# Patient Record
Sex: Female | Born: 1945 | Race: White | Hispanic: No | Marital: Married | Smoking: Never smoker
Health system: Southern US, Community
[De-identification: ages and names within clinical notes are randomized; demographics above are authoritative.]

## PROBLEM LIST (undated history)

## (undated) DIAGNOSIS — R002 Palpitations: Secondary | ICD-10-CM

## (undated) DIAGNOSIS — I341 Nonrheumatic mitral (valve) prolapse: Secondary | ICD-10-CM

## (undated) DIAGNOSIS — I071 Rheumatic tricuspid insufficiency: Secondary | ICD-10-CM

## (undated) DIAGNOSIS — R011 Cardiac murmur, unspecified: Secondary | ICD-10-CM

## (undated) DIAGNOSIS — K219 Gastro-esophageal reflux disease without esophagitis: Secondary | ICD-10-CM

## (undated) DIAGNOSIS — H269 Unspecified cataract: Secondary | ICD-10-CM

## (undated) DIAGNOSIS — Z9889 Other specified postprocedural states: Secondary | ICD-10-CM

## (undated) DIAGNOSIS — I4891 Unspecified atrial fibrillation: Secondary | ICD-10-CM

## (undated) DIAGNOSIS — R51 Headache: Secondary | ICD-10-CM

## (undated) DIAGNOSIS — I340 Nonrheumatic mitral (valve) insufficiency: Secondary | ICD-10-CM

## (undated) DIAGNOSIS — R112 Nausea with vomiting, unspecified: Secondary | ICD-10-CM

## (undated) DIAGNOSIS — H9191 Unspecified hearing loss, right ear: Secondary | ICD-10-CM

## (undated) DIAGNOSIS — Z5189 Encounter for other specified aftercare: Secondary | ICD-10-CM

## (undated) DIAGNOSIS — M1990 Unspecified osteoarthritis, unspecified site: Secondary | ICD-10-CM

## (undated) DIAGNOSIS — I10 Essential (primary) hypertension: Secondary | ICD-10-CM

## (undated) DIAGNOSIS — M2669 Other specified disorders of temporomandibular joint: Secondary | ICD-10-CM

## (undated) DIAGNOSIS — Z789 Other specified health status: Secondary | ICD-10-CM

## (undated) DIAGNOSIS — T7840XA Allergy, unspecified, initial encounter: Secondary | ICD-10-CM

## (undated) DIAGNOSIS — E039 Hypothyroidism, unspecified: Secondary | ICD-10-CM

## (undated) DIAGNOSIS — D3500 Benign neoplasm of unspecified adrenal gland: Secondary | ICD-10-CM

## (undated) DIAGNOSIS — H538 Other visual disturbances: Secondary | ICD-10-CM

## (undated) DIAGNOSIS — C73 Malignant neoplasm of thyroid gland: Secondary | ICD-10-CM

## (undated) HISTORY — DX: Palpitations: R00.2

## (undated) HISTORY — PX: UPPER GASTROINTESTINAL ENDOSCOPY: SHX188

## (undated) HISTORY — DX: Encounter for other specified aftercare: Z51.89

## (undated) HISTORY — DX: Rheumatic tricuspid insufficiency: I07.1

## (undated) HISTORY — DX: Malignant neoplasm of thyroid gland: C73

## (undated) HISTORY — DX: Cardiac murmur, unspecified: R01.1

## (undated) HISTORY — DX: Benign neoplasm of unspecified adrenal gland: D35.00

## (undated) HISTORY — PX: THYROIDECTOMY: SHX17

## (undated) HISTORY — DX: Unspecified atrial fibrillation: I48.91

## (undated) HISTORY — PX: OTHER SURGICAL HISTORY: SHX169

## (undated) HISTORY — DX: Nonrheumatic mitral (valve) prolapse: I34.1

## (undated) HISTORY — DX: Gastro-esophageal reflux disease without esophagitis: K21.9

## (undated) HISTORY — DX: Allergy, unspecified, initial encounter: T78.40XA

## (undated) HISTORY — PX: APPENDECTOMY: SHX54

## (undated) HISTORY — DX: Unspecified osteoarthritis, unspecified site: M19.90

## (undated) HISTORY — PX: WRIST SURGERY: SHX841

## (undated) HISTORY — DX: Nonrheumatic mitral (valve) insufficiency: I34.0

---

## 1998-02-09 ENCOUNTER — Other Ambulatory Visit: Admission: RE | Admit: 1998-02-09 | Discharge: 1998-02-09 | Payer: Self-pay | Admitting: Family Medicine

## 1999-02-02 ENCOUNTER — Other Ambulatory Visit: Admission: RE | Admit: 1999-02-02 | Discharge: 1999-02-02 | Payer: Self-pay | Admitting: Family Medicine

## 2001-07-10 ENCOUNTER — Other Ambulatory Visit: Admission: RE | Admit: 2001-07-10 | Discharge: 2001-07-10 | Payer: Self-pay | Admitting: Family Medicine

## 2004-06-09 ENCOUNTER — Other Ambulatory Visit: Admission: RE | Admit: 2004-06-09 | Discharge: 2004-06-09 | Payer: Self-pay | Admitting: Family Medicine

## 2005-12-26 ENCOUNTER — Ambulatory Visit: Payer: Self-pay | Admitting: Cardiology

## 2006-01-07 ENCOUNTER — Ambulatory Visit: Payer: Self-pay | Admitting: Cardiology

## 2006-01-07 ENCOUNTER — Encounter: Payer: Self-pay | Admitting: Cardiology

## 2006-02-01 ENCOUNTER — Other Ambulatory Visit: Admission: RE | Admit: 2006-02-01 | Discharge: 2006-02-01 | Payer: Self-pay | Admitting: Family Medicine

## 2007-02-21 ENCOUNTER — Other Ambulatory Visit: Admission: RE | Admit: 2007-02-21 | Discharge: 2007-02-21 | Payer: Self-pay | Admitting: Family Medicine

## 2007-11-28 ENCOUNTER — Emergency Department (HOSPITAL_COMMUNITY): Admission: EM | Admit: 2007-11-28 | Discharge: 2007-11-28 | Payer: Self-pay | Admitting: Emergency Medicine

## 2008-01-21 ENCOUNTER — Encounter: Admission: RE | Admit: 2008-01-21 | Discharge: 2008-03-25 | Payer: Self-pay | Admitting: Orthopedic Surgery

## 2009-02-02 ENCOUNTER — Ambulatory Visit: Payer: Self-pay | Admitting: Cardiology

## 2009-03-08 ENCOUNTER — Telehealth: Payer: Self-pay | Admitting: Cardiology

## 2010-03-10 ENCOUNTER — Observation Stay (HOSPITAL_COMMUNITY): Admission: EM | Admit: 2010-03-10 | Discharge: 2010-03-11 | Payer: Self-pay | Admitting: Emergency Medicine

## 2010-03-10 ENCOUNTER — Ambulatory Visit: Payer: Self-pay | Admitting: Cardiology

## 2010-03-11 ENCOUNTER — Encounter: Payer: Self-pay | Admitting: Cardiology

## 2010-03-14 ENCOUNTER — Telehealth: Payer: Self-pay | Admitting: Cardiology

## 2010-03-28 ENCOUNTER — Ambulatory Visit: Payer: Self-pay

## 2010-03-28 ENCOUNTER — Ambulatory Visit (HOSPITAL_COMMUNITY): Admission: RE | Admit: 2010-03-28 | Discharge: 2010-03-28 | Payer: Self-pay | Admitting: Cardiology

## 2010-03-28 ENCOUNTER — Encounter: Payer: Self-pay | Admitting: Cardiology

## 2010-03-28 ENCOUNTER — Ambulatory Visit: Payer: Self-pay | Admitting: Cardiology

## 2010-03-28 DIAGNOSIS — I059 Rheumatic mitral valve disease, unspecified: Secondary | ICD-10-CM

## 2010-03-28 DIAGNOSIS — M810 Age-related osteoporosis without current pathological fracture: Secondary | ICD-10-CM

## 2010-03-28 DIAGNOSIS — K219 Gastro-esophageal reflux disease without esophagitis: Secondary | ICD-10-CM

## 2010-03-28 DIAGNOSIS — R002 Palpitations: Secondary | ICD-10-CM | POA: Insufficient documentation

## 2010-04-04 ENCOUNTER — Ambulatory Visit: Payer: Self-pay | Admitting: Internal Medicine

## 2010-04-04 ENCOUNTER — Encounter: Payer: Self-pay | Admitting: Physician Assistant

## 2010-04-04 DIAGNOSIS — I4891 Unspecified atrial fibrillation: Secondary | ICD-10-CM | POA: Insufficient documentation

## 2010-04-04 DIAGNOSIS — T887XXA Unspecified adverse effect of drug or medicament, initial encounter: Secondary | ICD-10-CM

## 2010-04-06 ENCOUNTER — Telehealth: Payer: Self-pay | Admitting: Cardiology

## 2010-04-07 ENCOUNTER — Encounter: Payer: Self-pay | Admitting: Physician Assistant

## 2010-04-10 ENCOUNTER — Ambulatory Visit: Payer: Self-pay | Admitting: Cardiology

## 2010-04-10 DIAGNOSIS — I10 Essential (primary) hypertension: Secondary | ICD-10-CM

## 2010-05-09 ENCOUNTER — Encounter: Payer: Self-pay | Admitting: Cardiology

## 2010-05-10 ENCOUNTER — Ambulatory Visit: Payer: Self-pay | Admitting: Cardiology

## 2010-08-05 LAB — HM DEXA SCAN

## 2010-10-09 ENCOUNTER — Telehealth: Payer: Self-pay | Admitting: Cardiology

## 2010-12-07 NOTE — Miscellaneous (Signed)
  Clinical Lists Changes  Observations: Added new observation of ECHOINTERP:  - Left ventricle: The cavity size was normal. There was mild focal       basal hypertrophy of the septum. Systolic function was normal. The       estimated ejection fraction was in the range of 60% to 65%. Wall       motion was normal; there were no regional wall motion       abnormalities. Doppler parameters are consistent with abnormal       left ventricular relaxation (grade 1 diastolic dysfunction).     - Mitral valve: Mild regurgitation.     - Atrial septum: There was an atrial septal aneurysm.     - Tricuspid valve: Moderate regurgitation.     - Pulmonary arteries: Systolic pressure was moderately increased. PA       peak pressure: 49mm Hg (S).     Impressions:            - Density in RA on parasternal views most likely represents       prominent eustachian valve; suggest TEE to further evaluate if       clinically indicated. (03/28/2010 13:53)      Echocardiogram  Procedure date:  03/28/2010  Findings:       - Left ventricle: The cavity size was normal. There was mild focal       basal hypertrophy of the septum. Systolic function was normal. The       estimated ejection fraction was in the range of 60% to 65%. Wall       motion was normal; there were no regional wall motion       abnormalities. Doppler parameters are consistent with abnormal       left ventricular relaxation (grade 1 diastolic dysfunction).     - Mitral valve: Mild regurgitation.     - Atrial septum: There was an atrial septal aneurysm.     - Tricuspid valve: Moderate regurgitation.     - Pulmonary arteries: Systolic pressure was moderately increased. PA       peak pressure: 49mm Hg (S).     Impressions:            - Density in RA on parasternal views most likely represents       prominent eustachian valve; suggest TEE to further evaluate if       clinically indicated.

## 2010-12-07 NOTE — Assessment & Plan Note (Signed)
Summary: eph./ gd   Visit Type:  Follow-up  CC:  swelling on fingers.  History of Present Illness: This is a 65 year old white female patient who was admitted to the hospital with new onset atrial fibrillation thought secondary to hyperthyroidism with a TSH of 0.006. She was discharged home on metoprolol and full strength aspirin for stroke risk reduction.  Since the patient's at home she is kept track of her pulse and blood pressure. Blood pressures run as low as 88/59 and a one-time high of 153/63. Usually in the systolic blood pressures in the 80s to 90s. Her pulse ranges from 83-125 but mostly in the 80s.  The patient's main complaint is swelling of her hands with cracking in her knuckles and bleeding. This is started since she's taken they metoprolol. She's also had some swelling in her toes. She also has had a headache on metoprolol but has taken her aspirin before the metoprolol which has helped some.  The patient denies palpitations dizziness or presyncope. She does not use caffeine.Her Synthroid dose was adjusted.  Current Medications (verified): 1)  Metoprolol Tartrate 25 Mg Tabs (Metoprolol Tartrate) .... Take One Twice Daily 2)  Aspirin 325 Mg Tabs (Aspirin) .... Take One Daily 3)  Centrum Silver  Tabs (Multiple Vitamins-Minerals) .... Take One Daily 4)  Synthroid 200 Mcg Tabs (Levothyroxine Sodium) .... Take One in The Evening  Allergies (verified): 1)  ! Pcn 2)  ! Tylenol 3)  ! Codeine 4)  ! Celebrex  Past History:  Past Medical History: Last updated: 03/28/2010  1. Palpitations (the patient reports prior diagnosis of SVT).   2. Mitral valve prolapse (per patient report) and mild mitral       regurgitation (per 2-D echocardiogram).   3. GERD.   4. Osteoarthritis.   5. History of thyroid cancer at age 58.       a.     S/P thyroid resection with subsequent hypothyroidism.       b.     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      Social History: Reviewed history from 03/28/2010 and no changes required. The patient lives in [place] with her husband.  She   used to work full-time as a Conservator, museum/gallery, and now still works 1 day a   week.  No tobacco, no EtOH, no illicit drugs.  No herbal meds.  The   patient is a vegetarian and consumes no caffeine.  She does no regular   exercise, but is very active and denies any exertional symptoms.      Review of Systems       see history of present illness  Vital Signs:  Patient profile:   65 year old female Height:      66 inches Weight:      137 pounds BMI:     22.19 Pulse rate:   84 / minute Pulse rhythm:   regular BP sitting:   190 / 80  Vitals Entered By: Jacquelin Hawking, CMA (Apr 04, 2010 10:16 AM)  Physical Exam  General:   Well-nournished, in no acute distress. Neck: No JVD, HJR, Bruit, or thyroid enlargement Lungs: No tachypnea, clear without wheezing, rales, or rhonchi Cardiovascular: RRR, PMI not displaced, heart sounds normal,midsystolic click with 1/6 systolic murmur at the left sternal border and apex, no gallops, bruit, thrill, or heave. Abdomen: BS normal. Soft without organomegaly, masses,  lesions or tenderness. Extremities: patient has swelling with lesions and bleeding at her joints in her fingers.without cyanosis, clubbing or edema. Good distal pulses bilateral SKin: Warm, no lesions or rashes  Musculoskeletal: No deformities Neuro: no focal signs    EKG  Procedure date:  04/04/2010  Findings:      normal sinus rhythm with LVH,small delta wave no significant change from EKG in the hospital.  Impression & Recommendations:  Problem # 1:  ATRIAL FIBRILLATION (ICD-427.31) Patient had new-onset atrial fibrillation felt secondary to hyperthyroidism with a TSH of 0.006. She converted to sinus rhythm on beta blocker. She is now having an allergic reaction with swelling of her hands and joints with bleeding. Will  try Coreg. If this does not work we will change her to the diltiazem.She is in sinus rhythm today. The following medications were removed from the medication list:    Metoprolol Tartrate 25 Mg Tabs (Metoprolol tartrate) .Marland Kitchen... Take one twice daily Her updated medication list for this problem includes:    Aspirin 325 Mg Tabs (Aspirin) .Marland Kitchen... Take one daily    Coreg 6.25 Mg Tabs (Carvedilol) .Marland Kitchen... Take one tablet by mouth twice a day  Problem # 2:  ADVERSE DRUG REACTION (ICD-995.20) patient is having swelling of her hands and feet joints with bleeding since she is taking metoprolol. We will try to switch her to Coreg. If this continues we will have to switch her to diltiazem.  Problem # 3:  MITRAL VALVE PROLAPSE (ICD-424.0) Patient had 2-D echo in the hospital that showed basal hypertrophy normal systolic function ejection fraction 60-65% with grade 1 diastolic dysfunction, mild mitral regurgitation, and atrial septal aneurysm, moderate tricuspid regurgitation, and a density in the right atrium on parasternal views most likely represents a prominent eustachian valve but TEE could be done to further evaluate if indicated. The following medications were removed from the medication list:    Metoprolol Tartrate 25 Mg Tabs (Metoprolol tartrate) .Marland Kitchen... Take one twice daily Her updated medication list for this problem includes:    Coreg 6.25 Mg Tabs (Carvedilol) .Marland Kitchen... Take one tablet by mouth twice a day  Other Orders: EKG w/ Interpretation (93000)  Patient Instructions: 1)  Your physician recommends that you schedule a follow-up appointment in: 1 week with PA. Dr. Antoine Poche in one month. 2)  Your physician has recommended you make the following change in your medication: stop taken Metoprolol. Start Coreg 6.25 mg. Take one tablet by mouth twice a day.  New Orders:     1)  EKG w/ Interpretation (93000)  Prescriptions: COREG 6.25 MG TABS (CARVEDILOL) Take one tablet by mouth twice a day  #60 x 6    Entered by:   Ollen Gross, RN, BSN   Authorized by:   Marletta Lor, PA-C   Signed by:   Ollen Gross, RN, BSN on 04/04/2010   Method used:   Print then Give to Patient   RxID:   [id number]

## 2010-12-07 NOTE — Assessment & Plan Note (Signed)
Summary: [facility]   Visit Type:  Follow-up Primary Provider:  Dr. Vernon Prey  CC:  Atrial Fibrillation.  History of Present Illness: The patient presents for follow up of the above. She life briefly with fibrillation and a very low TSH. She converted to sinus rhythm and has been managed with a low dose of metoprolol. Her Synthroid dose has been reduced. She is being followed by her endocrinologist. She thinks she may have some fibrillation but can't be sure. None has been documented. She will feel some rapid heart rates particularly prior to her metoprolol dose. She doesn't think the drug lasts all 12 hours. She saw our physician's assistant in the office as she was having some swelling but she thought might be a reaction to the beta blocker. She was given a prescription for carvedilol but didn't take this as she had been told not to take an alpha blocker with her hyperthyroidism. She says she is having no swelling now and continue to take the metoprolol. She denies any chest discomfort, neck or arm discomfort. She denies any shortness of breath, PND or orthopnea. She has had no presyncope or syncope.  Current Medications (verified): 1)  Aspirin 325 Mg Tabs (Aspirin) .... Take One Daily 2)  Centrum Silver  Tabs (Multiple Vitamins-Minerals) .... Take One Daily 3)  Synthroid 200 Mcg Tabs (Levothyroxine Sodium) .... Take One in The Evening 4)  Metoprolol Tartrate 25 Mg Tabs (Metoprolol Tartrate) .... Take One Tablet By Mouth Twice A Day 5)  Vitamin D3 1000 Unit Tabs (Cholecalciferol) .... 2 Tabs By Mouth Once Daily 6)  Preservision/lutein  Caps (Multiple Vitamins-Minerals) .Marland Kitchen.. 1 By Mouth Daily  Allergies (verified): 1)  ! Pcn 2)  ! Tylenol 3)  ! Codeine 4)  ! Celebrex  Past History:  Past Medical History:  1. Palpitations (the patient reports prior diagnosis of SVT).   2. Mitral valve prolapse (per patient report) and mild mitral       regurgitation (per 2-D echocardiogram).   3. GERD.   4. Osteoarthritis.   5. History of thyroid cancer at age 37.       a.     S/P thyroid resection with subsequent hypothyroidism.       b.     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    6. Atrial fibrillation  Review of Systems       As stated in the HPI and negative for all other systems.   Vital Signs:  Patient profile:   65 year old female Height:      66 inches Weight:      136 pounds BMI:     22.03 Pulse rate:   88 / minute Resp:     18 per minute BP sitting:   142 / 78  (right arm)  Vitals Entered By: Marrion Coy, CNA (May 10, 2010 10:16 AM)  Physical Exam  General:  Well developed, well nourished, in no acute distress. Head:  normocephalic and atraumatic Mouth:  Teeth, gums and palate normal. Oral mucosa normal. Neck:  Neck supple, no JVD. No masses, thyromegaly or abnormal cervical , back well-healed thyroid scar Chest Wall:  no deformities or breast masses noted Lungs:  Clear bilaterally to auscultation and percussion. Abdomen:  Bowel sounds positive; abdomen soft and non-tender without masses, organomegaly, or hernias noted. No hepatosplenomegaly. Msk:  Back normal, normal gait. Muscle strength  and tone normal. Extremities:  significant bilateral varicose veins Neurologic:  Alert and oriented x 3. Skin:  Intact without lesions or rashes. Cervical Nodes:  no significant adenopathy Inguinal Nodes:  no significant adenopathy Psych:  Normal affect.   Detailed Cardiovascular Exam  Neck    Carotids: Carotids full and equal bilaterally without bruits.      Neck Veins: Normal, no JVD.    Heart    Inspection: no deformities or lifts noted.      Palpation: normal PMI with no thrills palpable.      Auscultation: regular rate and rhythm, S1, S2 without murmurs, rubs, gallops, or clicks.    Vascular    Abdominal Aorta: no palpable masses, pulsations, or audible bruits.      Femoral Pulses:  normal femoral pulses bilaterally.      Pedal Pulses: normal pedal pulses bilaterally.      Radial Pulses: normal radial pulses bilaterally.      Peripheral Circulation: no clubbing, cyanosis, or edema noted with normal capillary refill.     Impression & Recommendations:  Problem # 1:  ATRIAL FIBRILLATION (ICD-427.31) She still has some palpitations. However, she has had some hypotension and is reluctant to increase her metoprolol dose. Therefore, I changed the timing and ask her to take half of a 25 mg tablet 3 times a day. She'll call if this causes any problems or she continues to have tachycardia palpitations. She is low thromboembolic risk and remains on aspirin only.  Problem # 2:  HYPERTENSION, BENIGN (ICD-401.1) Her blood pressure is very slightly elevated here. However, she reports that it is well controlled at home. She reports hypotensive episodes. All change the therapy only as above.  Problem # 3:  MITRAL VALVE PROLAPSE (ICD-424.0) She had an echo done on May 24th and there was no MVP noted.

## 2010-12-07 NOTE — Assessment & Plan Note (Signed)
Summary: pa f/u sl   Primary Provider:  Dr. Vernon Prey  CC:  check up.  History of Present Illness: This is a 65 year old white female patient Y. fell last week in the office after being hospitalized with new onset atrial fibrillation felt secondary to hyperthyroidism with a TSH of 0.006. She was placed on metoprolol 25 mg b.i.d. When I started last week she was complaining of swelling in her hands and feet associated with lesions on her fingers. She was convinced that she was having an allergic reaction to the metoprolol. I had asked her to stop the metoprolol and start carvedilol but when she went home she decided she didn't want to try carvedilol because it was an alpha-blocker. She stayed on the metoprolol and the swelling has resolved and the lesions on her fingers are healing. She does have a history of psoriasis and thinks this may be related.  Once again her blood pressure is elevated in the office but she does have a history of white coat syndrome. Blood pressures from home range from 81/50 144/78. Most blood pressures are on the lower end. She is feeling well on the metoprolol without significant palpitations. She does notice it doesn't last quite 12 hours and her heart tries to act up before she is ready for her next dose.  Current Medications (verified): 1)  Aspirin 325 Mg Tabs (Aspirin) .... Take One Daily 2)  Centrum Silver  Tabs (Multiple Vitamins-Minerals) .... Take One Daily 3)  Synthroid 200 Mcg Tabs (Levothyroxine Sodium) .... Take One in The Evening 4)  Metoprolol Tartrate 25 Mg Tabs (Metoprolol Tartrate) .... Take One Tablet By Mouth Twice A Day 5)  Vitamin D3 1000 Unit Tabs (Cholecalciferol) .... 2 Tabs By Mouth Once Daily  Allergies: 1)  ! Pcn 2)  ! Tylenol 3)  ! Codeine 4)  ! Celebrex  Past History:  Past Medical History: Last updated: 03/28/2010  1. Palpitations (the patient reports prior diagnosis of SVT).   2. Mitral valve prolapse (per patient report) and mild  mitral       regurgitation (per 2-D echocardiogram).   3. GERD.   4. Osteoarthritis.   5. History of thyroid cancer at age 43.       a.     S/P thyroid resection with subsequent hypothyroidism.       b.     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      Past Surgical History: Last updated: 03/28/2010  Thyroidectomy.  Review of Systems       see history of present illness  Vital Signs:  Patient profile:   65 year old female Height:      66 inches Weight:      136 pounds BMI:     22.03 Pulse rate:   83 / minute Resp:     12 per minute BP sitting:   190 / 80  (left arm)  Vitals Entered By: Kem Parkinson (April 10, 2010 10:07 AM)  Physical Exam  General:   Well developed, well-nournished, in no acute distress. Neck: No JVD, HJR, Bruit, or thyroid enlargement Lungs: No tachypnea, clear without wheezing, rales, or rhonchi Cardiovascular: Regular rate & rhythm, PMI not displaced, heart sounds normal, no murmurs, gallops, bruit, thrill, or heave. Abdomen: BS normal. Soft without organomegaly, masses, lesions or abnormal tenderness. Extremities: without cyanosis, clubbing or edema. Good distal pulses bilateral  SKin: Warm, no lesions or rashes      Impression & Recommendations:  Problem # 1:  ATRIAL FIBRILLATION (ICD-427.31) patient is doing well without recurrence of it her fibrillation on current metoprolol dose The following medications were removed from the medication list:    Coreg 6.25 Mg Tabs (Carvedilol) .Marland Kitchen... Take one tablet by mouth twice a day Her updated medication list for this problem includes:    Aspirin 325 Mg Tabs (Aspirin) .Marland Kitchen... Take one daily    Metoprolol Tartrate 25 Mg Tabs (Metoprolol tartrate) .Marland Kitchen... Take one tablet by mouth twice a day  Problem # 2:  ADVERSE DRUG REACTION (ICD-995.20) Patient is now tolerating metoprolol without further swelling or lesions on her hands or feet. We will  continue metoprolol for now.  Problem # 3:  HYPERTENSION, BENIGN (ICD-401.1) Patient has history of white coat syndrome. Blood pressures from home are all on the low side. I will not increasing his medications based on her reading today. The following medications were removed from the medication list:    Coreg 6.25 Mg Tabs (Carvedilol) .Marland Kitchen... Take one tablet by mouth twice a day Her updated medication list for this problem includes:    Aspirin 325 Mg Tabs (Aspirin) .Marland Kitchen... Take one daily    Metoprolol Tartrate 25 Mg Tabs (Metoprolol tartrate) .Marland Kitchen... Take one tablet by mouth twice a day  Patient Instructions: 1)  Your physician recommends that you schedule a follow-up appointment in: May 10 2010 in [place] with dr hochrein

## 2010-12-07 NOTE — Progress Notes (Signed)
Summary: refill request/pt completely out needs today  Phone Note Refill Request Message from:  Patient on October 09, 2010 9:43 AM  Refills Requested: Medication #1:  METOPROLOL TARTRATE 25 MG TABS Take one tablet by mouth twice a day [facility] [address] -pt completely out, really needs today   Method Requested: Telephone to Pharmacy Initial call taken by: Glynda Jaeger,  October 09, 2010 9:44 AM  Follow-up for Phone Call        RX sent in twice today. Pt notified. Marrion Coy, CNA  October 09, 2010 10:16 AM  Follow-up by: Marrion Coy, CNA,  October 09, 2010 10:16 AM

## 2010-12-07 NOTE — Progress Notes (Signed)
Summary: pt having headaches  Phone Note Call from Patient Call back at Home Phone [phone]   Caller: Patient Reason for Call: Talk to Nurse, Talk to Doctor Summary of Call: pt was in hosp the other night and was given lopressor 25mg  and she is now expericing headaches and was wondering was this the cause Initial call taken by: Omer Jack,  Mar 14, 2010 2:57 PM  Follow-up for Phone Call        03/14/10--4:15pm--pt calling stating she was in [facility] 03/11/10 with new onset a. fib--she was prescribed lopressor 25mg  two times a day and is now experiencing H/A and wanting to know if this is side effect of lopressor--advised--this is one of side effects of lopressor and to try taking tylenol about 1/2 hour before taking metoprolol--also advised sometimes body will take awhile to adjust to new med--if still experiencing H/A in 1 week,please call us back--pt understands and agrees--nt Follow-up by: Ledon Snare, RN,  Mar 14, 2010 4:23 PM

## 2010-12-07 NOTE — Progress Notes (Signed)
Summary: discuss meds  Phone Note Call from Patient Call back at Home Phone [phone]   Caller: Patient Reason for Call: Talk to Nurse Summary of Call: pt seen in office on 5/31 by pa. need to discuss meds.  Initial call taken by: Lorne Skeens,  April 06, 2010 11:59 AM  Follow-up for Phone Call        Marcelino Duster stopped metoprolol d/t swelling.  pt was started on carvedilol but she hasn't started it because while she was in the hospital she had taken Bystolic and she had hypotension.  She states she was told be Dr Gala Romney that she didn't need an Alpha-Blocker and while reading about the carvedilol she noticed it has alpha-blocking effects.  She doesn't feel comfortable taking it and therefore will remain on metoprolol.  Pt feels as though her swelling is some better.  She will follow-up as scheduled and call back before if she has any problems. Follow-up by: Charolotte Capuchin, RN,  April 06, 2010 12:28 PM

## 2010-12-07 NOTE — Miscellaneous (Signed)
  Clinical Lists Changes  Observations: Added new observation of ECHOINTERP:   - Left ventricle: The cavity size was normal. There was mild focal       basal hypertrophy of the septum. Systolic function was normal. The       estimated ejection fraction was in the range of 60% to 65%. Wall       motion was normal; there were no regional wall motion       abnormalities. Doppler parameters are consistent with abnormal       left ventricular relaxation (grade 1 diastolic dysfunction).     - Mitral valve: Mild regurgitation.     - Atrial septum: There was an atrial septal aneurysm.     - Tricuspid valve: Moderate regurgitation.     - Pulmonary arteries: Systolic pressure was moderately increased. PA       peak pressure: 49mm Hg (S).     Impressions:            - Density in RA on parasternal views most likely represents       prominent eustachian valve; suggest TEE to further evaluate if       clinically indicated. (03/28/2010 9:40)      Echocardiogram  Procedure date:  03/28/2010  Findings:        - Left ventricle: The cavity size was normal. There was mild focal       basal hypertrophy of the septum. Systolic function was normal. The       estimated ejection fraction was in the range of 60% to 65%. Wall       motion was normal; there were no regional wall motion       abnormalities. Doppler parameters are consistent with abnormal       left ventricular relaxation (grade 1 diastolic dysfunction).     - Mitral valve: Mild regurgitation.     - Atrial septum: There was an atrial septal aneurysm.     - Tricuspid valve: Moderate regurgitation.     - Pulmonary arteries: Systolic pressure was moderately increased. PA       peak pressure: 49mm Hg (S).     Impressions:            - Density in RA on parasternal views most likely represents       prominent eustachian valve; suggest TEE to further evaluate if       clinically indicated.

## 2010-12-13 ENCOUNTER — Ambulatory Visit (INDEPENDENT_AMBULATORY_CARE_PROVIDER_SITE_OTHER): Payer: BC Managed Care – PPO | Admitting: Cardiology

## 2010-12-13 ENCOUNTER — Encounter: Payer: Self-pay | Admitting: Cardiology

## 2010-12-13 DIAGNOSIS — I4891 Unspecified atrial fibrillation: Secondary | ICD-10-CM

## 2010-12-21 NOTE — Assessment & Plan Note (Signed)
Summary: [facility]   Visit Type:  Follow-up Primary Provider:  Dr. Vernon Prey  CC:  Atiral Fibrillation.  History of Present Illness: The patient presents for followup of her arrhythmias. Since I last saw her she has had a few palpitations but they have not been particularly symptomatic. She has had some episodes where her heart rate has actually been low and blood pressure below 80 systolic. At that point she holds her antihypertensives until her blood pressure comes back up. She has had no presyncope or syncope. She has had no chest pressure, neck or arm discomfort. She has had no new shortness of breath and she remains active.  Current Medications (verified): 1)  Aspirin 325 Mg Tabs (Aspirin) .... Take One Daily 2)  Centrum Silver  Tabs (Multiple Vitamins-Minerals) .... Take One Daily 3)  Synthroid 150 Mcg Tabs (Levothyroxine Sodium) .Marland Kitchen.. 1 By Mouth Dia Ly 4)  Metoprolol Tartrate 25 Mg Tabs (Metoprolol Tartrate) .... Take One Tablet By Mouth Twice A Day 5)  Vitamin D3 1000 Unit Tabs (Cholecalciferol) .... 2 Tabs By Mouth Once Daily 6)  Preservision/lutein  Caps (Multiple Vitamins-Minerals) .Marland Kitchen.. 1 By Mouth Daily  Allergies (verified): 1)  ! Pcn 2)  ! Tylenol 3)  ! Codeine 4)  ! Celebrex  Past History:  Past Medical History: Reviewed history from 05/10/2010 and no changes required.  1. Palpitations (the patient reports prior diagnosis of SVT).   2. Mitral valve prolapse (per patient report) and mild mitral       regurgitation (per 2-D echocardiogram).   3. GERD.   4. Osteoarthritis.   5. History of thyroid cancer at age 82.       a.     S/P thyroid resection with subsequent hypothyroidism.       b.     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    6. Atrial fibrillation  Past Surgical History: Reviewed history from 03/28/2010 and no changes required.  Thyroidectomy.  Review of Systems       As stated in  the HPI and negative for all other systems.   Vital Signs:  Patient profile:   65 year old female Height:      66 inches Weight:      136 pounds BMI:     22.03 Pulse rate:   77 / minute Resp:     16 per minute BP sitting:   140 / 78  (right arm)  Vitals Entered By: Marrion Coy, CNA (December 13, 2010 9:40 AM)  Physical Exam  General:  Well developed, well nourished, in no acute distress. Head:  normocephalic and atraumatic Eyes:  PERRLA/EOM intact; conjunctiva and lids normal. Mouth:  Teeth, gums and palate normal. Oral mucosa normal. Neck:  Neck supple, no JVD. No masses, thyromegaly or abnormal cervical nodes. Chest Wall:  no deformities or breast masses noted Lungs:  Clear bilaterally to auscultation and percussion. Abdomen:  Bowel sounds positive; abdomen soft and non-tender without masses, organomegaly, or hernias noted. No hepatosplenomegaly. Msk:  Back normal, normal gait. Muscle strength and tone normal. Extremities:  No clubbing or cyanosis. Neurologic:  Alert and oriented x 3. Skin:  Intact without lesions or rashes. Cervical Nodes:  no significant adenopathy Inguinal Nodes:  no significant adenopathy Psych:  Normal affect.   Detailed Cardiovascular Exam  Neck    Carotids: Carotids full and equal bilaterally without bruits.  Neck Veins: Normal, no JVD.    Heart    Inspection: no deformities or lifts noted.      Palpation: normal PMI with no thrills palpable.      Auscultation: regular rate and rhythm, S1, S2 without murmurs, rubs, gallops, or clicks.    Vascular    Abdominal Aorta: no palpable masses, pulsations, or audible bruits.      Femoral Pulses: normal femoral pulses bilaterally.      Pedal Pulses: normal pedal pulses bilaterally.      Radial Pulses: normal radial pulses bilaterally.      Peripheral Circulation: no clubbing, cyanosis, or edema noted with normal capillary refill.     EKG  Procedure date:  12/13/2010  Findings:       Rhythm, Rate 78, Axis within Normal Limits, Intervals with Normal Limits, LVH Voltage Criteria  Impression & Recommendations:  Problem # 1:  ATRIAL FIBRILLATION (ICD-427.31) She has no symptoms. She has few paroxysms of tachycardia palpitations. She is at low risk for thromboembolism. She will continue with meds as listed. No she did not increase her metoprolol t.i.d. it was suggested previously. She will however take a third dose as needed. Orders: EKG w/ Interpretation (93000)  Problem # 2:  HYPERTENSION, BENIGN (ICD-401.1) Her blood pressure is labile but for the most part well controlled.  She will continue the meds as listed.  Patient Instructions: 1)  Your physician recommends that you schedule a follow-up appointment in: 1 yr with Dr Antoine Poche in [place] 2)  Your physician recommends that you continue on your current medications as directed. Please refer to the Current Medication list given to you today.

## 2011-01-23 LAB — URINE MICROSCOPIC-ADD ON

## 2011-01-23 LAB — POCT CARDIAC MARKERS
Myoglobin, poc: 116 ng/mL (ref 12–200)
Troponin i, poc: <0.05 ng/mL (ref 0.00–0.09)

## 2011-01-23 LAB — CBC
MCHC: 34.3 g/dL (ref 30.0–36.0)
RBC: 4.5 MIL/uL (ref 3.87–5.11)
WBC: 8.9 10*3/uL (ref 4.0–10.5)

## 2011-01-23 LAB — CARDIAC PANEL(CRET KIN+CKTOT+MB+TROPI)
Relative Index: INVALID (ref 0.0–2.5)
Total CK: 91 U/L (ref 7–177)
Troponin I: 0.03 ng/mL (ref 0.00–0.06)

## 2011-01-23 LAB — COMPREHENSIVE METABOLIC PANEL
ALT: 23 U/L (ref 0–35)
AST: 29 U/L (ref 0–37)
Alkaline Phosphatase: 95 U/L (ref 39–117)
BUN: 13 mg/dL (ref 6–23)
CO2: 27 mEq/L (ref 19–32)
Calcium: 9.6 mg/dL (ref 8.4–10.5)
GFR calc Af Amer: >60 mL/min (ref >60)
Potassium: 3.8 mEq/L (ref 3.5–5.1)
Total Bilirubin: 0.7 mg/dL (ref 0.3–1.2)
Total Protein: 7.3 g/dL (ref 6.0–8.3)

## 2011-01-23 LAB — URINE CULTURE

## 2011-01-23 LAB — DIFFERENTIAL
Basophils Relative: 0 % (ref 0–1)
Eosinophils Absolute: 0.2 10*3/uL (ref 0.0–0.7)
Eosinophils Relative: 3 % (ref 0–5)
Lymphocytes Relative: 20 % (ref 12–46)
Neutro Abs: 6.3 10*3/uL (ref 1.7–7.7)

## 2011-01-23 LAB — URINALYSIS, ROUTINE W REFLEX MICROSCOPIC
Ketones, ur: NEGATIVE mg/dL
Leukocytes, UA: NEGATIVE
Nitrite: NEGATIVE
Specific Gravity, Urine: 1.005 (ref 1.005–1.030)
Urobilinogen, UA: 0.2 mg/dL (ref 0.0–1.0)
pH: 7 (ref 5.0–8.0)

## 2011-01-23 LAB — TROPONIN I: Troponin I: 0.05 ng/mL (ref 0.00–0.06)

## 2011-01-23 LAB — TSH: TSH: 0.006 u[IU]/mL — ABNORMAL LOW (ref 0.350–4.500)

## 2011-01-23 LAB — CK TOTAL AND CKMB (NOT AT ARMC): Total CK: 83 U/L (ref 7–177)

## 2011-02-02 ENCOUNTER — Encounter: Payer: Self-pay | Admitting: Family Medicine

## 2011-03-20 NOTE — Assessment & Plan Note (Signed)
[facility]                            CARDIOLOGY OFFICE NOTE   [name], [name]                       MRN:          [id number]  DATE:02/02/2009                            DOB:          28-Oct-1946    PRIMARY CARE PHYSICIAN:  Shelby Penna, MD   REASON FOR PRESENTATION:  Evaluate the patient with tachycardia.   HISTORY OF PRESENT ILLNESS:  The patient is a pleasant 65 year old white  female.  It has been over 3 years since we last saw her.  She had a  history of presyncope.  At that time, she was noted to be tachycardic,  but her Holter monitor demonstrated sinus tachycardia with normal  diurnal variation.  She did have slight hyperthyroidism with thyroid  replacement therapy which was intentional following history of cancer of  the thyroid as a child.  She did have an echocardiogram in 2007, that  demonstrated an EF of 60-65% with no significant valvular abnormalities.  She has some very mild mitral regurgitation.   She continues to have tachycardia.  Her rate today is 104.  She will  rarely feel this.  She did have 1 episode not long ago of feeling  lightheaded.  She was bending over doing some activities.  She stood up  and felt very lightheaded and her heart started to racing.  She had this  persisted for several minutes before it resolved.  She did not have any  frank syncope.  She otherwise does not report any cardiovascular  symptoms such as chest discomfort, neck, or arm discomfort.  She does  not have any shortness of breath.  She denies any PND or orthopnea.   PAST MEDICAL HISTORY:  1. Mild gastroesophageal reflux disease.  2. Osteoporosis.  3. Thyroid cancer at age 20 with apparent metastasis to the lungs      treated with surgery of the thyroid and iodine therapy for the lung      nodules.  4. Migraines.   PAST SURGICAL HISTORY:  Thyroidectomy.   ALLERGIES:  PENICILLIN and CODEINE.   MEDICATIONS:  1. Synthroid 0.3 mg  daily.  2. Vitamin D3.  3. Multivitamin.  4. Zithromax.  5. Aspirin 325 mg daily.   REVIEW OF SYSTEMS:  As stated in the HPI and otherwise negative for all  other systems.   PHYSICAL EXAMINATION:  GENERAL:  The patient is pleasant and in no  distress.  VITAL SIGNS:  Blood pressure 134/58, heart rate 104 and regular, weight  138 pounds, and body mass index 22.  HEENT:  Eyelids unremarkable.  Pupils equal, round, and reactive to  light.  Fundi within normal limits.  Oral mucosa unremarkable.  BACK:  No costovertebral angle tenderness.  CHEST:  Unremarkable.  HEART:  PMI not displaced or sustained.  S1 and S2 within normal limits.  No S3, no S4.  No clicks, no rubs, no murmurs.  ABDOMEN:  Flat, positive bowel sounds, normal in frequency and pitch.  No bruits, no rebound, no guarding.  No midline pulsatile mass.  No  hepatomegaly.  No splenomegaly.  SKIN:  No  rashes, no nodules.  EXTREMITIES:  2+ pulses throughout.  No edema, no cyanosis, no clubbing.  NEUROLOGIC:  Oriented to person, place, and time.  Cranial nerves II-XII  grossly intact.  Motor grossly intact.   EKG, sinus tachycardia, rate 104, premature atrial contractions, axis  within normal limits, intervals within normal limits, LVH by voltage  criteria, but unchanged from previous EKGs.   ASSESSMENT AND PLAN:  1. Tachycardia.  I reviewed her previous EKG and it is the same as      this one.  I reviewed the Holter monitor that she had in 2007.  She      had persistent sinus tachycardia, but with normal diurnal      variation.  She did have some brief runs of an atrial tachycardia.      At this point, I plan on placing another Holter monitor to make      sure that this is unchanged.  If I see no further sustained      tachyarrhythmias, I would suggest no therapy other than described      below.  At this visit, I do not see an indication for repeat      echocardiography as she is having no dyspnea or other cardiac       complaints.  2. Hyperthyroidism.  I did review the labs from Dr. Christell Pope.  Her TSH      most recently was 0.006 with T4 of 15.7 and a T3 of 40, both were      slightly elevated.  She says that they have kept her thyroid level      this high to prevent recurrence of thyroid cancer.  I am not      familiar with these data.  I do suspect however, this is driving      her tachycardia and although, this is not causing a problem most      likely currently, it could be a problem long-term.  I have given      her the name of Dr. Dorisann Pope to suggest that it might be      reasonable if Dr. Christell Pope agrees to suggest to discuss this with her.      I do not know if the recommendations have changed over the years      and whether continued thyroid replacement at this level would be      indicated.  3. Followup.  I will see her again as needed.     Rollene Rotunda, MD, [facility]  Electronically Signed    JH/MedQ  DD: 02/02/2009  DT: 02/03/2009  Job #: [id number]   cc:   [name], M.D.

## 2011-04-06 DIAGNOSIS — R002 Palpitations: Secondary | ICD-10-CM

## 2011-04-06 HISTORY — DX: Palpitations: R00.2

## 2011-04-23 ENCOUNTER — Inpatient Hospital Stay (HOSPITAL_COMMUNITY)
Admission: RE | Admit: 2011-04-23 | Discharge: 2011-04-24 | DRG: 512 | Disposition: A | Discharge status: Home / Self Care | Payer: Medicare Other | Source: Ambulatory Visit | Attending: Orthopedic Surgery | Admitting: Orthopedic Surgery

## 2011-04-23 ENCOUNTER — Ambulatory Visit (HOSPITAL_COMMUNITY): Payer: Medicare Other

## 2011-04-23 DIAGNOSIS — Z85850 Personal history of malignant neoplasm of thyroid: Secondary | ICD-10-CM

## 2011-04-23 DIAGNOSIS — W19XXXA Unspecified fall, initial encounter: Secondary | ICD-10-CM | POA: Diagnosis present

## 2011-04-23 DIAGNOSIS — S52599A Other fractures of lower end of unspecified radius, initial encounter for closed fracture: Principal | ICD-10-CM | POA: Diagnosis present

## 2011-04-23 DIAGNOSIS — I4891 Unspecified atrial fibrillation: Secondary | ICD-10-CM | POA: Diagnosis present

## 2011-04-23 DIAGNOSIS — Z7982 Long term (current) use of aspirin: Secondary | ICD-10-CM

## 2011-04-23 DIAGNOSIS — S62109A Fracture of unspecified carpal bone, unspecified wrist, initial encounter for closed fracture: Secondary | ICD-10-CM

## 2011-04-23 DIAGNOSIS — Z79899 Other long term (current) drug therapy: Secondary | ICD-10-CM

## 2011-04-23 LAB — BASIC METABOLIC PANEL
CO2: 33 mEq/L — ABNORMAL HIGH (ref 19–32)
Chloride: 99 mEq/L (ref 96–112)
Creatinine, Ser: <0.47 mg/dL — ABNORMAL LOW (ref 0.50–1.10)
Potassium: 4.2 mEq/L (ref 3.5–5.1)

## 2011-04-23 LAB — CBC
MCH: 29.3 pg (ref 26.0–34.0)
MCHC: 34 g/dL (ref 30.0–36.0)
Platelets: 229 10*3/uL (ref 150–400)

## 2011-04-23 LAB — APTT: aPTT: 28 seconds (ref 24–37)

## 2011-04-23 LAB — SURGICAL PCR SCREEN
MRSA, PCR: POSITIVE — AB
Staphylococcus aureus: POSITIVE — AB

## 2011-04-26 ENCOUNTER — Inpatient Hospital Stay (HOSPITAL_COMMUNITY)
Admission: EM | Admit: 2011-04-26 | Discharge: 2011-04-29 | DRG: 282 | Disposition: A | Discharge status: Home / Self Care | Payer: Medicare Other | Attending: Internal Medicine | Admitting: Internal Medicine

## 2011-04-26 ENCOUNTER — Emergency Department (HOSPITAL_COMMUNITY): Payer: Medicare Other

## 2011-04-26 DIAGNOSIS — Z79899 Other long term (current) drug therapy: Secondary | ICD-10-CM

## 2011-04-26 DIAGNOSIS — Z85850 Personal history of malignant neoplasm of thyroid: Secondary | ICD-10-CM

## 2011-04-26 DIAGNOSIS — E890 Postprocedural hypothyroidism: Secondary | ICD-10-CM | POA: Diagnosis present

## 2011-04-26 DIAGNOSIS — I10 Essential (primary) hypertension: Secondary | ICD-10-CM | POA: Diagnosis present

## 2011-04-26 DIAGNOSIS — K219 Gastro-esophageal reflux disease without esophagitis: Secondary | ICD-10-CM | POA: Diagnosis present

## 2011-04-26 DIAGNOSIS — I959 Hypotension, unspecified: Secondary | ICD-10-CM | POA: Diagnosis not present

## 2011-04-26 DIAGNOSIS — Z9889 Other specified postprocedural states: Secondary | ICD-10-CM

## 2011-04-26 DIAGNOSIS — Z7982 Long term (current) use of aspirin: Secondary | ICD-10-CM

## 2011-04-26 DIAGNOSIS — E876 Hypokalemia: Secondary | ICD-10-CM | POA: Diagnosis present

## 2011-04-26 DIAGNOSIS — I4891 Unspecified atrial fibrillation: Principal | ICD-10-CM | POA: Diagnosis present

## 2011-04-26 DIAGNOSIS — I498 Other specified cardiac arrhythmias: Secondary | ICD-10-CM | POA: Diagnosis present

## 2011-04-26 DIAGNOSIS — I214 Non-ST elevation (NSTEMI) myocardial infarction: Secondary | ICD-10-CM | POA: Diagnosis present

## 2011-04-26 DIAGNOSIS — M1990 Unspecified osteoarthritis, unspecified site: Secondary | ICD-10-CM | POA: Diagnosis present

## 2011-04-26 DIAGNOSIS — R002 Palpitations: Secondary | ICD-10-CM

## 2011-04-26 LAB — CBC
HCT: 39.1 % (ref 36.0–46.0)
Hemoglobin: 13.4 g/dL (ref 12.0–15.0)
MCHC: 34.3 g/dL (ref 30.0–36.0)
RBC: 4.56 MIL/uL (ref 3.87–5.11)
WBC: 11 10*3/uL — ABNORMAL HIGH (ref 4.0–10.5)

## 2011-04-26 LAB — BASIC METABOLIC PANEL
Calcium: 9.3 mg/dL (ref 8.4–10.5)
Glucose, Bld: 131 mg/dL — ABNORMAL HIGH (ref 70–99)
Potassium: 3.3 mEq/L — ABNORMAL LOW (ref 3.5–5.1)
Sodium: 139 mEq/L (ref 135–145)

## 2011-04-26 LAB — DIFFERENTIAL
Basophils Absolute: 0 10*3/uL (ref 0.0–0.1)
Basophils Relative: 0 % (ref 0–1)
Lymphocytes Relative: 11 % — ABNORMAL LOW (ref 12–46)
Monocytes Absolute: 0.9 10*3/uL (ref 0.1–1.0)
Neutro Abs: 8.7 10*3/uL — ABNORMAL HIGH (ref 1.7–7.7)
Neutrophils Relative %: 79 % — ABNORMAL HIGH (ref 43–77)

## 2011-04-26 LAB — CARDIAC PANEL(CRET KIN+CKTOT+MB+TROPI)
CK, MB: 13.9 ng/mL (ref 0.3–4.0)
Troponin I: 1.52 ng/mL (ref <0.30)

## 2011-04-26 LAB — CK TOTAL AND CKMB (NOT AT ARMC)
CK, MB: 11.7 ng/mL (ref 0.3–4.0)
Relative Index: INVALID (ref 0.0–2.5)
Total CK: 84 U/L (ref 7–177)

## 2011-04-27 DIAGNOSIS — I4891 Unspecified atrial fibrillation: Secondary | ICD-10-CM

## 2011-04-27 LAB — BASIC METABOLIC PANEL
CO2: 32 mEq/L (ref 19–32)
Calcium: 9.4 mg/dL (ref 8.4–10.5)
Potassium: 3.7 mEq/L (ref 3.5–5.1)
Sodium: 140 mEq/L (ref 135–145)

## 2011-04-27 LAB — CARDIAC PANEL(CRET KIN+CKTOT+MB+TROPI)
CK, MB: 9 ng/mL (ref 0.3–4.0)
Relative Index: INVALID (ref 0.0–2.5)
Total CK: 69 U/L (ref 7–177)

## 2011-04-28 ENCOUNTER — Inpatient Hospital Stay (HOSPITAL_COMMUNITY): Payer: Medicare Other

## 2011-04-28 DIAGNOSIS — R002 Palpitations: Secondary | ICD-10-CM

## 2011-04-28 LAB — GLUCOSE, CAPILLARY: Glucose-Capillary: 158 mg/dL — ABNORMAL HIGH (ref 70–99)

## 2011-04-28 MED ORDER — TECHNETIUM TC 99M TETROFOSMIN IV KIT
30.0000 | PACK | Freq: Once | INTRAVENOUS | Status: AC | PRN
  Administered 2011-04-28: 30 via INTRAVENOUS

## 2011-04-28 MED ORDER — TECHNETIUM TC 99M TETROFOSMIN IV KIT
10.0000 | PACK | Freq: Once | INTRAVENOUS | Status: AC | PRN
  Administered 2011-04-28: 10 via INTRAVENOUS

## 2011-04-29 ENCOUNTER — Other Ambulatory Visit (HOSPITAL_COMMUNITY): Payer: Medicare Other

## 2011-04-29 LAB — BASIC METABOLIC PANEL
Calcium: 10 mg/dL (ref 8.4–10.5)
Potassium: 4.8 mEq/L (ref 3.5–5.1)
Sodium: 142 mEq/L (ref 135–145)

## 2011-04-29 LAB — TSH: TSH: 0.009 u[IU]/mL — ABNORMAL LOW (ref 0.350–4.500)

## 2011-05-05 NOTE — H&P (Signed)
  [name], [name]              ACCOUNT NO.:  [id number]  MEDICAL RECORD NO.:  [id number]  LOCATION:  4743                         FACILITY:  [facility]  PHYSICIAN:  Madelynn Done, MD  DATE OF BIRTH:  1946/04/21  DATE OF ADMISSION:  04/23/2011 DATE OF DISCHARGE:                             HISTORY & PHYSICAL   HISTORY OF PRESENT ILLNESS:  [name] is a 65-year right-hand-dominant female who fell on outstretched right wrist on April 20, 2011.  She presented to the office today with a displaced distal radius fracture, recommended that she undergo operative intervention for the displaced distal radius fracture.  The patient denies any loss of consciousness, syncope, chest pain, any other constitutional symptoms.  Her past medical history, past surgical history, medications, allergies, social history, review of systems were signed and reviewed in my office notes.  She does not have any recent illnesses or hospitalizations. PHYSICAL EXAMINATION:  GENERAL:  She is a healthy-appearing female, height and weight and vital signs as listed in the computer.  She has normal hand coordination on left hand.  Normal mood.  She is alert and oriented to person, place and time.  No acute distress. HEENT:  She has a well-healed incision from her thyroid excision.  Her pupils are equal, round.  Sclerae are white.  Head atraumatic, normocephalic. NECK:  A well-healed midline incision.  No gross abnormalities. CHEST:  Equal chest excursion.  No audible wheezing. CARDIOVASCULAR:  Regular radial pulse. NEUROLOGICAL EXAM:  Alert and oriented to person, place and time, in no acute distress. EXTREMITIES:  Right upper extremity, the patient does have sugar-tong splint in good position.  She is able to extend her thumb, extend her digits.  Fingertips are warm and well-perfused.  Her radiographs do show the displaced distal radius fracture with the shortening and angulation.  IMPRESSION:  Right distal  radius fracture.  PLAN:  Today, the findings were reviewed with [name].  She is going to be taken to the operative suite, scheduled for open reduction internal fixation.  We will outline the plan in my office note and will dictate that.  All questions were answered and encouraged.  The patient overnight admission for IV antibiotics and pain control.     Madelynn Done, MD     FWO/MEDQ  D:  04/23/2011  T:  04/24/2011  Job:  [id number]  Electronically Signed by Bradly Bienenstock IV MD on 05/05/2011 08:46:17 PM

## 2011-05-05 NOTE — Op Note (Signed)
[name], [name]              ACCOUNT NO.:  [id number]  MEDICAL RECORD NO.:  [id number]  LOCATION:  4743                         FACILITY:  [facility]  PHYSICIAN:  Madelynn Done, MD  DATE OF BIRTH:  [birth year]  DATE OF PROCEDURE:  04/23/2011 DATE OF DISCHARGE:                              OPERATIVE REPORT   PREOPERATIVE DIAGNOSIS:  Right wrist intra-articular distal radius fracture with three more fragments.  POSTOPERATIVE DIAGNOSIS:  Right wrist intra-articular distal radius fracture with three more fragments.  ATTENDING SURGEON:  Madelynn Done, MD who scrubbed and present for the entire procedure.  ASSISTANT SURGEON:  None.  SURGICAL PROCEDURE: 1. Open reduction and internal fixation of displaced intra-articular     radius fracture with three more fragments. 2. Radiographs three-reviews, right wrist.  ANESTHESIA:  Axillary block with general anesthesia via LMA.  SURGICAL IMPLANTS:  [employer] volar distal radius plate, standard distal radius plate.  SURGICAL INDICATIONS:  [name] is a 65 year old right-hand-dominant female who sustained a closed injury to her right distal radius.  The patient was seen and evaluated in the office.  Given the nature of injury, it was recommended she undergo the above procedure.  Risks, benefits, and alternatives discussed in detail with the patient and signed informed consent was obtained.  Risks include but not limited to bleeding, infection, damage to nearby nerves, arteries, or tendons, malunion, nonunion, hardware failure, loss of motion of the wrist and digits, and need for surgical intervention.  DESCRIPTION OF PROCEDURE:  The patient was properly identified in the preop holding area, and mark with permanent marker made on the right wrist indicating the correct operative site.  The patient was brought back to the operating room, placed supine on the anesthesia room table where general anesthesia was  administered.  The patient had previously undergone axillary block.  The patient received preoperative antibiotics prior to skin incision.  A well-padded tourniquet was placed on the right brachium and sealed with 1000 drape. The right upper extremity was then prepped and draped in a normal sterile fashion.  Time-out was called.  Correct side was identified and procedure was then begun. Attention was then turned to the right wrist.  Longitudinal incision was made directly over the FCR sheath.  Dissection was then carried down through the skin and subcutaneous tissue.  Hemostasis was obtained with bipolar cautery.  Tourniquet insufflated.  The FCR sheath was then opened proximally and distally.  Going through the floor of the FCR sheath, the FPL was then identified, retracted radially, ulnarly. Pronator quadratus was elevated in an L-shaped fashion.  After elevation of the pronator quadratus, fracture site was then opened up.  The patient did have an intra-articular fragment, two different fracture lines extending given the joint with open fracture of the intra- articular segment.  We then ultimately reduced in order to reduce the radial column, and the brachioradialis was tenotomized and released with careful protection of the first dorsal part tendon.  After brachioradialis released then the reduction was then held in place with the reduction clamp.  The volar distal radius plate was then applied, held in place with the oblong screw hole proximally.  Plate  height was adjusted, held distally in place with the K-wire.  Following, the distal fixation was then carried out from the ulnar columns to the radial column with a combination of smooth pegs and partially threaded pegs. After fixation was carried out proximally with a 3.5 bicortical screws, two more, a total of six cortices proximally.  Thorough wound irrigation was done throughout.  Following this, final radiographs were then carried  out.  Stress radiography was then carried out to confirm placement of the internal fixation as well as the stress of the distal and radioulnar joint and interosseous ligament without any interosseous ligament widening.  After this was done, the pronator quadratus was then repaired with 2-0 Vicryl.  Subcutaneous tissue was closed with 4-0 Vicryl, skin closed with 4-0 Prolene horizontal mattress sutures. Adaptic dressing, sterile compressive bandage were applied, tourniquet had been deflated with good perfusion of the fingertips.  The patient was then placed in a well-molded sugar-tong splint, extubated, and taken to recovery room in good condition.  Intraoperative radiographs, three-views of the wrist did show the volar plate fixation in place with good restoration of the overall height and inclination.  The patient does have the fracture line extending into the radial column with a very minimal displacement.  POSTOPERATIVE PLAN:  The patient admitted for IV antibiotics and pain control, seen back in the office in approximately 10-14 days for wound check, suture removal, x-rays, application of a short-arm cast, total cast immobilization for 4 weeks then begin with therapy regimen.     Madelynn Done, MD     FWO/MEDQ  D:  04/23/2011  T:  04/24/2011  Job:  [id number]  Electronically Signed by Bradly Bienenstock IV MD on 05/05/2011 08:46:20 PM

## 2011-05-07 NOTE — Discharge Summary (Signed)
[name], [name]              ACCOUNT NO.:  [id number]  MEDICAL RECORD NO.:  [id number]  LOCATION:  2028                         FACILITY:  [facility]  PHYSICIAN:  Rollene Rotunda, MD, FACCDATE OF BIRTH:  27-Jun-1946  DATE OF ADMISSION:  04/26/2011 DATE OF DISCHARGE:  04/29/2011                              DISCHARGE SUMMARY   PRIMARY CARDIOLOGIST:  Rollene Rotunda, MD, [facility].  PRIMARY CARE PROVIDER:  Dr. Christell Constant.  DISCHARGING DIAGNOSES: 1. Atrial fibrillation with rapid ventricular response, spontaneously     converted to normal sinus rhythm.     a.     Flecainide therapy initiated this admission. 2. Non-ST-elevation myocardial infarction, secondary to atrial     fibrillation with rapid ventricular response.     a.     Normal stress test this admission. 3. Hypertension.  SECONDARY DIAGNOSES: 1. Hypothyroidism. 2. Gastroesophageal reflux disease. 3. History of thyroid cancer at the age of 41, status post     thyroidectomy, now on Synthroid replacement.  ALLERGIES:  PENICILLIN, TYLENOL, CODEINE, and CELEBREX.  PROCEDURE/DIAGNOSTICS PERFORMED DURING HOSPITALIZATION: 1. Steffanie Dunn on April 28, 2011:  Allowing for breast attenuation     artifact, there are no perfusion defects.  Ejection fraction noted     to be 63%.  Wall motion normal. 2. Chest x-ray on April 26, 2011:  No evidence of acute cardiopulmonary     disease process.  REASON FOR HOSPITALIZATION:  This is a fairly healthy 65 year old female with the above-stated problem list and recent ORIF of her right wrist on April 23, 2011.  Since that time, the patient has noted increased frequency of palpitations.  On day of admission, several days after her surgery, she awoke to her heart pounding.  She took her morning dose of Lopressor, waited 30 minutes without relief.  She also felt she had a full feeling in her chest.  Therefore, she called EMS.  Initially, EMS showed atrial fibrillation with rapid ventricular response  at 170 beats per minute.  Valsalva maneuver, adenosine 6 mg, adenosine 12 mg were initiated with minimal break.  The patient sustained an atrial fibrillation, was given Cardizem 20 mg IV bolus.  This did slow her rate and she converted back to sinus tachycardia.  Cardiology was asked to evaluate the patient.  Initial testing included cardiac enzymes being mildly elevated with a CK-MB of 11.7 and a troponin of 0.95.  The patient states that her chest fullness left once her rates improved. Her EKG showed no evidence of pre-excitation or acute ST-T-wave changes. Of note, the patient was mildly hypokalemic at 3.3.  Her pressures were also elevated with a systolic of 161-180.  The patient was admitted to rule out for myocardial infarction.  HOSPITAL COURSE:  The patient was admitted to the telemetry unit.  Her cardiac enzymes were cycled and she did have an elevation to 1.52, but these were on a downward trend to 1.19.  With her complaints of mild chest pressure as well as elevated troponins, the patient underwent a nuclear stress test during admission.  This showed no perfusion defects. Ejection fraction was noted to be 63% with normal wall motion.  The patient had no further complaints of chest  pain.  Of note, the patient did have an episode of hypotension post nuclear stress testing with a systolic BP of 85.  The patient was given 250 mL bolus which did increase her systolic to 98.  The patient was brought from the wheelchair back to her bed on the telemetry unit and her symptoms of diaphoresis, weakness, and nausea improved.  The patient remained in normal sinus rhythm throughout admission.  After further discussion with Dr. Antoine Poche, I felt the patient should be started on low-dose flecainide to maintain normal sinus rhythm.  Risks, benefits, and alternatives were discussed with the patient and she agreed to proceed with flecainide.  With the patient having a normal stress test, this  was felt appropriate.  On the day of discharge, Dr. Antoine Poche evaluated the patient and noted her stable for home.  She had no further complaints of chest pain. Again, she was in normal sinus rhythm.  She will be discharged on flecainide as well as metoprolol and followed up in the outpatient setting.  DISCHARGE LABORATORY DATA:  Sodium 142, potassium 4.8, BUN 12, creatinine less than 0.47.  Last set of cardiac markers; CK 69, CK-MB 9, troponin I 1.18.  DISCHARGE MEDICATIONS: 1. Flecainide 50 mg 1 tablet twice daily. 2. Lopressor 50 mg 1 tablet twice daily. 3. Aspirin 325 mg 1 tablet daily. 4. Centrum Silver 1 tablet daily. 5. Dilaudid oral 2 mg 1 tablet every 6 hours as needed for pain. 6. Lutein over the counter 1 tablet daily. 7. Synthroid 137 mcg 1 tablet daily. 8. Vitamin D 2000 International Units over the counter 1 tablet daily.  FOLLOWUP PLANS AND INSTRUCTIONS: 1. The patient will follow up with Dr. Antoine Poche in 1-3 weeks, the     office will call to schedule this appointment. 2. The patient should increase activity as tolerated. 3. The patient should continue low-sodium, heart-healthy diet. 4. The patient should avoid activities that cause chest pain,     shortness of breath, or dizziness. 5. The patient is to call the office in the interim for any problems     or concerns.  DURATION OF DISCHARGE:  Greater than 30 minutes with PA and physician time.     Leonette Monarch, PA-C   ______________________________ Rollene Rotunda, MD, [facility]    NB/MEDQ  D:  04/29/2011  T:  04/29/2011  Job:  [id number]  cc:   Dr. Christell Constant  Electronically Signed by Alen Blew P.A. on 04/29/2011 05:05:59 PM Electronically Signed by Rollene Rotunda MD [facility] on 05/07/2011 02:27:21 PM

## 2011-05-13 ENCOUNTER — Emergency Department (HOSPITAL_COMMUNITY)
Admission: EM | Admit: 2011-05-13 | Discharge: 2011-05-14 | Disposition: A | Discharge status: Home / Self Care | Payer: Medicare Other | Attending: Emergency Medicine | Admitting: Emergency Medicine

## 2011-05-13 ENCOUNTER — Telehealth: Payer: Self-pay | Admitting: Adult Health

## 2011-05-13 DIAGNOSIS — R110 Nausea: Secondary | ICD-10-CM | POA: Insufficient documentation

## 2011-05-13 DIAGNOSIS — Z79899 Other long term (current) drug therapy: Secondary | ICD-10-CM | POA: Insufficient documentation

## 2011-05-13 DIAGNOSIS — Z85850 Personal history of malignant neoplasm of thyroid: Secondary | ICD-10-CM | POA: Insufficient documentation

## 2011-05-13 DIAGNOSIS — G43909 Migraine, unspecified, not intractable, without status migrainosus: Secondary | ICD-10-CM | POA: Insufficient documentation

## 2011-05-13 NOTE — Telephone Encounter (Signed)
Patient called complaining of severe headache which she states is like her usual migraine. I asked her who her primary care physician was and she said Dr. Rudi Heap.  She had called him but no return call so she called [place] for pain relief.  I advised her to call Dr.Moore's number again.  IF no response, she is to go to ER if pain persists or becomes worse. She verbalized understanding.

## 2011-05-14 LAB — CK TOTAL AND CKMB (NOT AT ARMC)
CK, MB: 4.8 ng/mL — ABNORMAL HIGH (ref 0.3–4.0)
Total CK: 44 U/L (ref 7–177)

## 2011-05-14 LAB — BASIC METABOLIC PANEL
BUN: 19 mg/dL (ref 6–23)
CO2: 27 mEq/L (ref 19–32)
Calcium: 9.2 mg/dL (ref 8.4–10.5)
Creatinine, Ser: <0.47 mg/dL — ABNORMAL LOW (ref 0.50–1.10)
Glucose, Bld: 157 mg/dL — ABNORMAL HIGH (ref 70–99)

## 2011-05-28 ENCOUNTER — Encounter: Payer: Self-pay | Admitting: Cardiology

## 2011-05-28 NOTE — Consult Note (Signed)
[name], [name]              ACCOUNT NO.:  [id number]  MEDICAL RECORD NO.:  [id number]  LOCATION:  2923                         FACILITY:  [facility]  PHYSICIAN:  Hillis Range, MD       DATE OF BIRTH:  03-25-1946  DATE OF CONSULTATION:  04/26/2011 DATE OF DISCHARGE:                                CONSULTATION   PRIMARY CARDIOLOGIST:  Rollene Rotunda, MD, [facility]  PRIMARY CARE PROVIDER:  Dr. Christell Constant.  CHIEF COMPLAINT:  Palpitations.  HISTORY OF PRESENT ILLNESS:  This is a relatively healthy 65 year old female with history of palpitations.  She had one episode of SVT approximately 15 years ago when she underwent a stress test.  The patient denies any SVTs since that time.  The patient states that she can tell the difference in her SVT versus paroxysmal atrial fibrillation because her SVT makes her feel lightheaded and almost as if she is going to blackout.  The patient does state that she has had paroxysmal atrial fibrillation for years.  She denies any sustained symptoms, usually lasting approximately several minutes maybe once a week or so.  The patient underwent ORIF of her right wrist on April 23, 2011.  Since that time, she has had increased palpitations.  She then felt like she was treated for possible atrial fibrillation postoperatively.  The patient states she awoke this morning to her heart pounding.  She took her Lopressor 25 mg and waited approximately 30 minutes without relief.  The palpitations did not resolve and her chest had a full feeling.  The patient denies prior episodes of chest pain.  She is able to climb stairs and do exercise without difficulty.  EMS's initial strip showed atrial fibrillation with rapid ventricular response at 170 beats per minute.  Valsalva maneuver, adenosine 6 mg and adenosine 12 mg were given.  Post adenosine 12 mg, it broke slightly to show atrial fibrillation.  Then, the patient was given Cardizem 20 mg IV push which did slow her rate  and put her back into sinus tachycardia at a rate of 118 beats per minute.  With the patient's decreased heart rate, her chest pain resolved.  In the emergency department, the patient has no further complaints.  She did have one episode of SVT that broke on its own.  The patient was lightheaded during this episode, but no chest pain.  Of note, her potassium is mildly decreased at 3.3.  She denies any shortness of breath.  She denies any recent fevers or chills.  Upon further review of EKG, it is noted that there is no evidence of pre-excitation.  The patient's troponin as well as CK-MB are elevated at 0.95 and 11.7 respectively.  Again, the patient is without chest pain.  The patient is also mildly hypertensive with systolic pressures from 161-180.  PAST MEDICAL HISTORY: 1. History of palpitations.     a.     The patient states she had an episode of SVT approximately      15 years ago, post stress test. 2. There is a question of mitral valve prolapse, although echoes do     not reveal this. 3. Gastroesophageal reflux disease. 4. Osteoarthritis. 5. Thyroid cancer  at the age of 76.     a.     Status post thyroidectomy, now on Synthroid. 6. Questionable lung disease, status post biopsy with no further     followup needed.  SOCIAL HISTORY:  The patient lives at home with her husband.  She denies tobacco, alcohol, or illicit drug use.  FAMILY HISTORY:  Noncontributory for premature coronary artery disease.  ALLERGIES:  PENICILLIN, TYLENOL, CODEINE, and CELEBREX.  HOME MEDICATIONS: 1. Dilaudid oral 2 mg every 6 hours as needed for pain in her wrist. 2. Vitamin D 2000 units over-the-counter 1 tablet daily. 3. Synthroid 137 mcg 1 tablet daily. 4. Lutein over-the-counter daily. 5. Lopressor 25 mg p.o. twice daily. 6. Centrum Silver over-the-counter daily. 7. Aspirin 325 mg daily.  REVIEW OF SYSTEMS:  All pertinent positives and negatives stated in HPI. All other systems have been  reviewed and negative.  PHYSICAL EXAMINATION:  VITAL SIGNS:  Temperature 98, pulse 89-150, respirations 25, blood pressure 128-188/51-100, O2 saturation 100% on 2 L. GENERAL:  This is a well-developed, well-nourished middle-aged female. She is in no acute distress. HEENT:  Normal.  Of note, she does have thinning hair. NECK:  Supple without bruit or JVD.  There is a scar distally secondary to her thyroidectomy. HEART:  Tachycardic with S1 and S2.  No murmur, rub, or gallop noted. LUNGS:  Clear to auscultation bilaterally without wheezes, rales, or rhonchi. ABDOMEN:  Soft, nontender, positive bowel sounds x4. EXTREMITIES:  No clubbing, cyanosis, or edema. EXTREMITIES:  Warm to the touch.  There are varicose veins bilaterally. MUSCULOSKELETAL:  No joint deformities or effusions. NEUROLOGIC:  Alert and oriented x3.  Cranial nerves II through XII grossly intact.  Chest x-ray showing no evidence of acute cardiopulmonary process.  EKG; her tracings are as above.  Initial EKG in the emergency department showing sinus rhythm at a rate of 96 beats per minute.  No acute ST- or T-wave changes.  Axis is normal.  Intervals are normal.  Second EKG showing supraventricular tachycardia at a rate of 185 beats per minute.  LABORATORY DATA:  WBC 11, hemoglobin 13.4, hematocrit 39.1, platelet 251.  Sodium 139, potassium 3.3, chloride 99, bicarb 29, BUN 22, creatinine less than 0.47, CK 84, CK-MB 11.7, troponin 0.95.  ASSESSMENT/PLAN:  This is a 65 year old female with history of SVT 15 years ago and paroxysmal atrial fibrillation who presents with sustained symptomatic atrial fibrillation.  She is recently status post ORIF of her right wrist earlier this week.  They patient's initial cardiac markers were slightly elevated.  There were no ischemic changes on EKG. Review of EKG reveals the SVT and atrial fibrillation.  There is no evidence of pre-excitation.  It is felt the patient's troponin's  are most likely due to rapid ventricular response.  The patient will be admitted to the telemetry unit and ruled out for myocardial infarction. If the patient's cardiac markers remain flat trend, then we will plan for an outpatient Myoview.  At that time, if Myoview is normal, then we would consider adding flecainide.  The patient is agreeable to this.  If she does fail medical treatment, then ablation can be considered. Currently, the patient has a CHADS2 score of zero and has recently been treated with aspirin 325 mg daily, and this will be continued.  The patient's hypokalemia will be repleted and we will recheck a BUN in the morning.  With the patient being hypertensive in the emergency department, we will increase the patient's Lopressor 50 mg twice  daily and monitor closely.  Further treatment will be dependent upon the above.     Leonette Monarch, PA-C   ______________________________ Hillis Range, MD    NB/MEDQ  D:  04/26/2011  T:  04/27/2011  Job:  [id number]  cc:   Rollene Rotunda, MD, [facility] Dr. Christell Constant  Electronically Signed by Alen Blew P.A. on 04/29/2011 05:05:52 PM Electronically Signed by Hillis Range MD on 05/28/2011 09:59:03 AM

## 2011-05-28 NOTE — Discharge Summary (Signed)
  [name], [name]              ACCOUNT NO.:  [id number]  MEDICAL RECORD NO.:  [id number]  LOCATION:  4743                         FACILITY:  [facility]  PHYSICIAN:  Madelynn Done, MD  DATE OF BIRTH:  August 17, 1946  DATE OF ADMISSION:  04/23/2011 DATE OF DISCHARGE:  04/24/2011                              DISCHARGE SUMMARY   The patient had an overnight admission 23-hour observation stay.  The patient had an open reduction and internal fixation of her distal radius.  With less than 24 hours, the patient was discharged to home. Appropriate discharge instructions were given on the care and followup. She was appropriate on the day of discharge, medications were administered.  Prior to discharge, the patient's discharge instructions were explained to her.  The patient voiced understanding the plan of the options.  Again, less than 24-hour hospital admission.     Madelynn Done, MD     FWO/MEDQ  D:  05/06/2011  T:  05/07/2011  Job:  [id number]  Electronically Signed by Bradly Bienenstock IV MD on 05/28/2011 09:50:01 PM

## 2011-05-30 ENCOUNTER — Encounter: Payer: Self-pay | Admitting: Cardiology

## 2011-05-30 ENCOUNTER — Ambulatory Visit (INDEPENDENT_AMBULATORY_CARE_PROVIDER_SITE_OTHER): Payer: Medicare Other | Admitting: Cardiology

## 2011-05-30 VITALS — BP 88/60 | HR 100 | Resp 16 | Ht 66.0 in | Wt 129.0 lb

## 2011-05-30 DIAGNOSIS — I4891 Unspecified atrial fibrillation: Secondary | ICD-10-CM

## 2011-05-30 MED ORDER — METOPROLOL TARTRATE 25 MG PO TABS: 25.0000 mg | ORAL_TABLET | Freq: Two times a day (BID) | ORAL | Status: DC

## 2011-05-30 NOTE — Assessment & Plan Note (Signed)
No further imaging is indicated at this time.  She had an echo in 2011.

## 2011-05-30 NOTE — Assessment & Plan Note (Signed)
Her blood pressure is running low. I will reduce the metoprolol to 25 b.i.d. And eliminate it if she remains lightheaded and hypotensive.

## 2011-05-30 NOTE — Patient Instructions (Signed)
Your physician has requested that you have an exercise tolerance test. For further information please visit [email or web address] Please also follow instruction sheet, as given.  Please decrease your Metoprolol to 25 mg twice a day.  Continue other medications as listed.  Please have a Flecainide level drawn the same day as your stress test.

## 2011-05-30 NOTE — Assessment & Plan Note (Signed)
She seems to be maintaining sinus rhythm with flecainide. I will check a level. She needs an exercise treadmill test to rule out proarrhythmia. She doesn't want to do this but I think she will.

## 2011-05-30 NOTE — Progress Notes (Signed)
HPI The patient presents for followup after recent hospitalization with atrial fibrillation. She was treated with flecainide. She did have enzyme elevation but had a stress perfusion study demonstrating no evidence of ischemia with an EF of 63%.  Since discharge she has had no paroxysms of palpitations and no apparent fibrillation. However, she has been white he has had a low blood pressure. She denies any chest pressure or arm discomfort. This had no new shortness of breath, PND or orthopnea. She's had no weight gain or edema.   Allergies  Allergen Reactions  . Acetaminophen   . Celecoxib   . Codeine   . Fosamax   . Penicillins   . Ventolin (WUJ:[id number])     Current Outpatient Prescriptions  Medication Sig Dispense Refill  . aspirin 325 MG EC tablet Take 325 mg by mouth daily.        Marland Kitchen azithromycin (ZITHROMAX) 250 MG tablet Take 2 tablets by mouth on day 1, followed by 1 tablet by mouth daily for 4 days. 2 hours before proc       . Cholecalciferol (VITAMIN D3) 2000 UNITS TABS Take 1 capsule by mouth daily.        . flecainide (TAMBOCOR) 50 MG tablet Take 50 mg by mouth 2 (two) times daily.        Marland Kitchen levothyroxine (SYNTHROID, LEVOTHROID) 137 MCG tablet Take 137 mcg by mouth daily.        . Loratadine (CLARITIN PO) Take 1 tablet by mouth daily.        . metoprolol tartrate (LOPRESSOR) 25 MG tablet Take 50 mg by mouth 2 (two) times daily.       . Multiple Vitamins-Minerals (CENTRUM SILVER) tablet Take 1 tablet by mouth daily.          Past Medical History  Diagnosis Date  . Palpitations     piror to diagnosis of SVT   . Mitral valve prolapse     per pt report  . MR (mitral regurgitation)     per 2-d echocardiogram  . GERD (gastroesophageal reflux disease)   . Osteoarthritis   . Thyroid cancer     age 65; s/p resection with subsequent hypothyroidism. 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 Atrial fibrillation     Past Surgical History  Procedure Date  .  Thyroidectomy   . Lung biopsies   . Appendectomy   . Wrist surgery     ROS:  Migraines.  Otherwise as stated in the HPI and negative for all other systems.  PHYSICAL EXAM BP 88/60  Pulse 100  Resp 16  Ht 5\' 6"  (1.676 m)  Wt 129 lb (58.514 kg)  BMI 20.82 kg/m2 GENERAL:  Well appearing HEENT:  Pupils equal round and reactive, fundi not visualized, oral mucosa unremarkable NECK:  No jugular venous distention, waveform within normal limits, carotid upstroke brisk and symmetric, no bruits, no thyromegaly LYMPHATICS:  No cervical, inguinal adenopathy LUNGS:  Clear to auscultation bilaterally BACK:  No CVA tenderness CHEST:  Unremarkable HEART:  PMI not displaced or sustained,S1 and S2 within normal limits, no S3, no S4, no clicks, no rubs, no murmurs ABD:  Flat, positive bowel sounds normal in frequency in pitch, no bruits, no rebound, no guarding, no midline pulsatile mass, no hepatomegaly, no splenomegaly EXT:  2 plus pulses throughout, no edema, no cyanosis no clubbing, varicose veins SKIN:  No rashes no nodules NEURO:  Cranial nerves II through XII grossly intact, motor grossly  intact throughout PSYCH:  Cognitively intact, oriented to person place and time  ASSESSMENT AND PLAN

## 2011-06-13 ENCOUNTER — Telehealth: Payer: Self-pay | Admitting: Cardiology

## 2011-06-13 NOTE — Telephone Encounter (Signed)
Pt wants a call re stress test she has next week

## 2011-06-13 NOTE — Telephone Encounter (Signed)
Patient is concern about holding the Metoprolol medication the morning of the GXT on 06/20/11. Patient states her HR is usually 112 to 115 beats/minute when she gets up in the morning. Patient takes Metoprolol 25 mg twice a day 7:30 AM and 7:30 PM. She is afraid that her   Pulse  will be too high when she start the stress test. Tereso Newcomer aware , he recommends for pt. To take the medication at 9:00 Pm the night before, and if her pulse is 115 in the morning of the test, pt to take 1/2 of the 25 mg tablet, Pt  is aware that if she takes the medication the heart rate wont be as high as it should  Be. Patient aware, She   verbalized understanding.

## 2011-06-20 ENCOUNTER — Ambulatory Visit (INDEPENDENT_AMBULATORY_CARE_PROVIDER_SITE_OTHER): Payer: Medicare Other | Admitting: Physician Assistant

## 2011-06-20 DIAGNOSIS — I4891 Unspecified atrial fibrillation: Secondary | ICD-10-CM

## 2011-06-20 NOTE — Progress Notes (Signed)
Exercise Treadmill Test  Pre-Exercise Testing Evaluation Rhythm: normal sinus  Rate: 86   PR:  .14 QRS:  .09  QT:  .38 QTc: 45     Test  Exercise Tolerance Test Ordering MD: Angelina Sheriff, MD  Interpreting MD:  Tereso Newcomer PA-C  Unique Test No: 1  Treadmill:  1  Indication for ETT: Flecainide  Contraindication to ETT: No   Stress Modality: exercise - treadmill  Cardiac Imaging Performed: non   Protocol: standard Bruce - maximal  Max BP:  205/106  Max MPHR (bpm):  155 85% MPR (bpm):  131  MPHR obtained (bpm):  127 % MPHR obtained:  81  Reached 85% MPHR (min:sec):  N/A Total Exercise Time (min-sec):  7:23  Workload in METS:  5.5 Borg Scale: 15  Reason ETT Terminated:  patient's desire to stop    ST Segment Analysis At Rest: non-specific ST segment slurring With Exercise: borderline ST changes due to LVH with repolarization abnormality  Other Information Arrhythmia:  No Angina during ETT:  absent (0) Quality of ETT:  non-diagnostic  ETT Interpretation:  borderline (indeterminate) with non-specific ST changes  Comments: Poor exercise tolerance. Patient with "white coat HTN" and initial BP 210/110, but this came down to 139/89. Patient reluctant to do test at first. No chest pain. ECG with LVH with repolarization abnormality. No exercise induced ventricular arrhythmias at the patient's maximal effort.  Recommendations: Follow up with Dr. Antoine Poche as directed.

## 2011-07-30 ENCOUNTER — Telehealth: Payer: Self-pay | Admitting: Cardiology

## 2011-07-30 NOTE — Telephone Encounter (Signed)
Schedule follow up.  She should hold the beta blocker until then.  She could see Scott.

## 2011-07-30 NOTE — Telephone Encounter (Signed)
Spoke with pt who reports having frequently episodes of both a decrease in her HR and her BP.  This am her BP was 60/40 per her report and HR 47.  She felt very bad - weak.  She retook her BP later and it was 110/?.  Pt instructed to hold Metoprolol dose if HR and BP are that low and to call the office.  She is aware I will forward information to Dr Antoine Poche for his review and orders.

## 2011-07-30 NOTE — Telephone Encounter (Signed)
Patient calling c/o blood pressure dropping in am.  B/p @ 8:25 60/40. Heart rate 47. 110/70 @ 10:45 heart rate 105. Patient took am meds.

## 2011-08-01 NOTE — Telephone Encounter (Signed)
Spoke with pt who states her BP is better today. She did take her medications later in the day than usual and is going to work on getting then back on schedule.  Instructed pt that Dr Antoine Poche wants her to hold her beta blocker and schedule an appointment to follow up.  Pt states understanding however does not want to stop the beta blocker completely and will continue to check her BP on a regular basis.  She wanted to be seen in the [place] office.  The next day Dr Antoine Poche sees pts in [place] is 10/10.  An appointment was given for her then.  Pt was instructed to call if further problems prior to her appt.

## 2011-08-15 ENCOUNTER — Encounter: Payer: Self-pay | Admitting: Cardiology

## 2011-08-15 ENCOUNTER — Ambulatory Visit (INDEPENDENT_AMBULATORY_CARE_PROVIDER_SITE_OTHER): Payer: Medicare Other | Admitting: Cardiology

## 2011-08-15 DIAGNOSIS — I10 Essential (primary) hypertension: Secondary | ICD-10-CM

## 2011-08-15 DIAGNOSIS — I4891 Unspecified atrial fibrillation: Secondary | ICD-10-CM

## 2011-08-15 DIAGNOSIS — R002 Palpitations: Secondary | ICD-10-CM

## 2011-08-15 MED ORDER — METOPROLOL TARTRATE 25 MG PO TABS: 25.0000 mg | ORAL_TABLET | ORAL | Status: DC | PRN

## 2011-08-15 NOTE — Patient Instructions (Signed)
Please have Flecainide level drawn tomorrow.  May take Metoprolol as needed only  Continue all other medications as listed  Follow up in 6 months with Dr Antoine Poche.  You will receive a letter in the mail 2 months before you are due.  Please call us when you receive this letter to schedule your follow up appointment.

## 2011-08-15 NOTE — Assessment & Plan Note (Signed)
She had an echo in 11 and no further study is needed at this time.

## 2011-08-15 NOTE — Progress Notes (Signed)
HPI The patient presents for followup of atrial fibrillation. She has been treated with flecainide. I sent her for ETT on flecainide.  She did OK with this altough she didn't reach target heart rate.  She has had no tachypalpitaitons.  The patient denies any new symptoms such as chest discomfort, neck or arm discomfort. There has been no new shortness of breath, PND or orthopnea. There have been no reported syncope.  I did review her BP list and she has had some very low readings associated with light headedness.  (SBP of 54!)  She does not drink very many fluids in a day and does not use salt.   Allergies  Allergen Reactions  . Acetaminophen   . Celecoxib   . Codeine   . Fosamax   . Penicillins   . Ventolin (ZOX:[id number])     Current Outpatient Prescriptions  Medication Sig Dispense Refill  . aspirin 325 MG EC tablet Take 325 mg by mouth daily.        Marland Kitchen azithromycin (ZITHROMAX) 250 MG tablet Take 2 tablets by mouth on day 1, followed by 1 tablet by mouth daily for 4 days. 2 hours before proc       . Cholecalciferol (VITAMIN D3) 2000 UNITS TABS Take 1 capsule by mouth daily.        . flecainide (TAMBOCOR) 50 MG tablet Take 50 mg by mouth 2 (two) times daily.        Marland Kitchen levothyroxine (SYNTHROID, LEVOTHROID) 137 MCG tablet Take 137 mcg by mouth daily.        . Loratadine (CLARITIN PO) Take 1 tablet by mouth daily.        . metoprolol tartrate (LOPRESSOR) 25 MG tablet Take 1 tablet (25 mg total) by mouth 2 (two) times daily.      . Multiple Vitamins-Minerals (CENTRUM SILVER) tablet Take 1 tablet by mouth daily.          Past Medical History  Diagnosis Date  . Palpitations     piror to diagnosis of SVT   . Mitral valve prolapse     per pt report  . MR (mitral regurgitation)     per 2-d echocardiogram  . GERD (gastroesophageal reflux disease)   . Osteoarthritis   . Thyroid cancer     age 50; s/p resection with subsequent hypothyroidism. hx of mets to luns, which was treated (unsure of  how)  . Atrial fibrillation     Past Surgical History  Procedure Date  . Thyroidectomy   . Lung biopsies   . Appendectomy   . Wrist surgery     ROS:  As stated in the HPI and negative for all other systems.  PHYSICAL EXAM BP 138/82  Pulse 98  Resp 18  Ht 5\' 6"  (1.676 m)  Wt 139 lb (63.05 kg)  BMI 22.44 kg/m2 GENERAL:  Well appearing HEENT:  Pupils equal round and reactive, fundi not visualized, oral mucosa unremarkable NECK:  No jugular venous distention, waveform within normal limits, carotid upstroke brisk and symmetric, no bruits, no thyromegaly LYMPHATICS:  No cervical, inguinal adenopathy LUNGS:  Clear to auscultation bilaterally BACK:  No CVA tenderness CHEST:  Unremarkable HEART:  PMI not displaced or sustained,S1 and S2 within normal limits, no S3, no S4, no clicks, no rubs, no murmurs ABD:  Flat, positive bowel sounds normal in frequency in pitch, no bruits, no rebound, no guarding, no midline pulsatile mass, no hepatomegaly, no splenomegaly EXT:  2 plus pulses throughout, no edema, no  cyanosis no clubbing, varicose veins SKIN:  No rashes no nodules NEURO:  Cranial nerves II through XII grossly intact, motor grossly intact throughout PSYCH:  Cognitively intact, oriented to person place and time  ASSESSMENT AND PLAN

## 2011-08-15 NOTE — Assessment & Plan Note (Signed)
She will continue the meds as listed and I will get a flecainide level.

## 2011-08-15 NOTE — Assessment & Plan Note (Signed)
Her blood pressure is running so low that I will have her stop her scheduled beta blocker.  She will take this only if her BP is elevated or she has a rapid heart rate which she sometimes gets when she is anxious.  At those moments her BP trends upward and prn low dose beta blocker might abort a hypertensive urgency.

## 2011-08-17 ENCOUNTER — Encounter: Payer: Self-pay | Admitting: Internal Medicine

## 2011-11-20 ENCOUNTER — Other Ambulatory Visit: Payer: Self-pay | Admitting: *Deleted

## 2011-11-20 MED ORDER — FLECAINIDE ACETATE 50 MG PO TABS: 50.0000 mg | ORAL_TABLET | Freq: Two times a day (BID) | ORAL | Status: DC

## 2011-11-21 ENCOUNTER — Other Ambulatory Visit: Payer: Self-pay | Admitting: Cardiology

## 2011-11-21 MED ORDER — FLECAINIDE ACETATE 50 MG PO TABS: 50.0000 mg | ORAL_TABLET | Freq: Two times a day (BID) | ORAL | Status: DC

## 2011-11-23 ENCOUNTER — Telehealth: Payer: Self-pay | Admitting: Cardiology

## 2012-01-02 ENCOUNTER — Ambulatory Visit (INDEPENDENT_AMBULATORY_CARE_PROVIDER_SITE_OTHER): Payer: Medicare Other | Admitting: Cardiology

## 2012-01-02 ENCOUNTER — Encounter: Payer: Self-pay | Admitting: Cardiology

## 2012-01-02 DIAGNOSIS — I4891 Unspecified atrial fibrillation: Secondary | ICD-10-CM

## 2012-01-02 DIAGNOSIS — I10 Essential (primary) hypertension: Secondary | ICD-10-CM

## 2012-01-02 DIAGNOSIS — I059 Rheumatic mitral valve disease, unspecified: Secondary | ICD-10-CM

## 2012-01-02 NOTE — Assessment & Plan Note (Signed)
I reviewed her last echo. She had only mild mitral regurgitation. I would not suspect clinically that this is changed. No further imaging is indicated at this point.

## 2012-01-02 NOTE — Patient Instructions (Signed)
The current medical regimen is effective;  continue present plan and medications.  Follow up in 1 year with Dr Hochrein.  You will receive a letter in the mail 2 months before you are due.  Please call us when you receive this letter to schedule your follow up appointment.  

## 2012-01-02 NOTE — Assessment & Plan Note (Signed)
Though her blood pressure is labile she's not having any symptoms. I will make no change her regimen.

## 2012-01-02 NOTE — Assessment & Plan Note (Signed)
She has had no symptomatic paroxysms. She will continue with meds as listed.

## 2012-01-02 NOTE — Progress Notes (Signed)
HPI The patient presents for followup of atrial fibrillation. She has been treated with flecainide. Since last saw her she has been doing well. She has not had any symptomatic paroxysms of atrial fibrillation. She keeps with blood pressure diary which I reviewed. She does still have some fluctuating blood pressures with the lowest being systolics 76. However, for the most part is well controlled. If her blood pressure was low she will lie down.  She has had no syncope. She has had no chest pressure, neck or arm discomfort.  Allergies  Allergen Reactions  . Acetaminophen   . Celecoxib   . Codeine   . Fosamax   . Penicillins   . Ventolin (ZOX:[id number])     Current Outpatient Prescriptions  Medication Sig Dispense Refill  . aspirin 325 MG EC tablet Take 325 mg by mouth daily.        Marland Kitchen azithromycin (ZITHROMAX) 250 MG tablet Take 2 tablets by mouth on day 1, followed by 1 tablet by mouth daily for 4 days. 2 hours before proc       . Cholecalciferol (VITAMIN D3) 2000 UNITS TABS Take 1 capsule by mouth daily.        . flecainide (TAMBOCOR) 50 MG tablet Take 1 tablet (50 mg total) by mouth 2 (two) times daily.  60 tablet  6  . levothyroxine (SYNTHROID, LEVOTHROID) 137 MCG tablet Take 137 mcg by mouth daily.        . Loratadine (CLARITIN PO) Take 1 tablet by mouth daily.        . metoprolol tartrate (LOPRESSOR) 25 MG tablet Take 1 tablet (25 mg total) by mouth as needed.      . Multiple Vitamins-Minerals (CENTRUM SILVER) tablet Take 1 tablet by mouth daily.          Past Medical History  Diagnosis Date  . Palpitations     piror to diagnosis of SVT   . Mitral valve prolapse     per pt report  . MR (mitral regurgitation)     per 2-d echocardiogram  . GERD (gastroesophageal reflux disease)   . Osteoarthritis   . Thyroid cancer     age 66; s/p resection with subsequent hypothyroidism. 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 Atrial fibrillation     Past Surgical History   Procedure Date  . Thyroidectomy   . Lung biopsies   . Appendectomy   . Wrist surgery     ROS:  As stated in the HPI and negative for all other systems.  PHYSICAL EXAM BP 130/79  Pulse 90  Ht 5\' 6"  (1.676 m)  Wt 136 lb (61.689 kg)  BMI 21.95 kg/m2 GENERAL:  Well appearing HEENT:  Pupils equal round and reactive, fundi not visualized, oral mucosa unremarkable NECK:  No jugular venous distention, waveform within normal limits, carotid upstroke brisk and symmetric, no bruits, no thyromegaly LYMPHATICS:  No cervical, inguinal adenopathy LUNGS:  Clear to auscultation bilaterally BACK:  No CVA tenderness CHEST:  Unremarkable HEART:  PMI not displaced or sustained,S1 and S2 within normal limits, no S3, no S4, no clicks, no rubs, no murmurs ABD:  Flat, positive bowel sounds normal in frequency in pitch, no bruits, no rebound, no guarding, no midline pulsatile mass, no hepatomegaly, no splenomegaly EXT:  2 plus pulses throughout, no edema, no cyanosis no clubbing, varicose veins  EKG:  Sinus rhythm, rate 92, left hypertrophy by voltage criteria, interventricular conduction delay, premature ectopic complex, no  significant change from previous.  01/02/2012  ASSESSMENT AND PLAN

## 2012-01-23 ENCOUNTER — Other Ambulatory Visit: Payer: Self-pay

## 2012-01-23 ENCOUNTER — Other Ambulatory Visit: Payer: Self-pay | Admitting: Cardiology

## 2012-01-23 DIAGNOSIS — R002 Palpitations: Secondary | ICD-10-CM

## 2012-01-23 DIAGNOSIS — I10 Essential (primary) hypertension: Secondary | ICD-10-CM

## 2012-01-23 MED ORDER — METOPROLOL TARTRATE 25 MG PO TABS: 25.0000 mg | ORAL_TABLET | ORAL | Status: DC | PRN

## 2012-01-23 NOTE — Telephone Encounter (Signed)
..   Requested Prescriptions   Signed Prescriptions Disp Refills  . metoprolol tartrate (LOPRESSOR) 25 MG tablet 30 tablet 9    Sig: Take 1 tablet (25 mg total) by mouth as needed.    Authorizing Provider: Rollene Rotunda    Ordering User: Christella Hartigan, Milus Fritze Judie Petit

## 2012-01-25 ENCOUNTER — Encounter: Payer: Self-pay | Admitting: Cardiology

## 2012-01-28 ENCOUNTER — Encounter: Payer: Self-pay | Admitting: Cardiology

## 2012-01-28 ENCOUNTER — Ambulatory Visit (INDEPENDENT_AMBULATORY_CARE_PROVIDER_SITE_OTHER): Payer: Medicare Other | Admitting: Cardiology

## 2012-01-28 VITALS — BP 155/100 | HR 98 | Ht 66.0 in | Wt 132.0 lb

## 2012-01-28 DIAGNOSIS — I10 Essential (primary) hypertension: Secondary | ICD-10-CM

## 2012-01-28 DIAGNOSIS — I4891 Unspecified atrial fibrillation: Secondary | ICD-10-CM

## 2012-01-28 MED ORDER — METOPROLOL TARTRATE 25 MG PO TABS: 12.5000 mg | ORAL_TABLET | ORAL | Status: DC | PRN

## 2012-01-28 NOTE — Patient Instructions (Signed)
Please take an extra 1/2 tablet of Metoprolol (12.5 mg) as needed for SYSTOLIC BP above 150.  Continue all other medications as listed  Follow up in 4 months with Dr Antoine Poche.  You will receive a letter in the mail 2 months before you are due.  Please call us when you receive this letter to schedule your follow up appointment.

## 2012-01-28 NOTE — Progress Notes (Signed)
HPI The patient presents for follow up of HTN.  She recently had some visual disturbance and was found to have had a "stroke" in her right eye. She was told this was likely related to her blood pressure. She subsequently found out that her blood pressure monitor was not accurate. She had been off of her beta blocker as it was making her fatigue. She has restarted this. Her blood pressures have been controlled although she had one reading in the evening of 163/88. She denies palpitations, presyncope or syncope. She has had no chest pressure, neck or arm discomfort. She denies any new shortness of breath, PND or orthopnea.  Allergies  Allergen Reactions  . Acetaminophen   . Celecoxib   . Codeine   . Fosamax   . Penicillins   . Ventolin (RUE:[id number])     Current Outpatient Prescriptions  Medication Sig Dispense Refill  . aspirin 325 MG EC tablet Take 325 mg by mouth daily.        Marland Kitchen azithromycin (ZITHROMAX) 250 MG tablet Take 2 tablets by mouth on day 1, followed by 1 tablet by mouth daily for 4 days. 2 hours before proc       . Cholecalciferol (VITAMIN D3) 2000 UNITS TABS Take 1 capsule by mouth daily.        . flecainide (TAMBOCOR) 50 MG tablet Take 1 tablet (50 mg total) by mouth 2 (two) times daily.  60 tablet  6  . levothyroxine (SYNTHROID, LEVOTHROID) 137 MCG tablet Take 137 mcg by mouth daily.        . Loratadine (CLARITIN PO) Take 1 tablet by mouth daily.        . metoprolol tartrate (LOPRESSOR) 25 MG tablet Take 25 mg by mouth 2 (two) times daily. Take 1/2 in am and in pm      . Multiple Vitamins-Minerals (CENTRUM SILVER) tablet Take 1 tablet by mouth daily.          Past Medical History  Diagnosis Date  . Palpitations     piror to diagnosis of SVT   . Mitral valve prolapse     per pt report  . MR (mitral regurgitation)     per 2-d echocardiogram  . GERD (gastroesophageal reflux disease)   . Osteoarthritis   . Thyroid cancer     age 65; s/p resection with subsequent  hypothyroidism. 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 Atrial fibrillation     Past Surgical History  Procedure Date  . Thyroidectomy   . Lung biopsies   . Appendectomy   . Wrist surgery     ROS:  As stated in the HPI and negative for all other systems.  PHYSICAL EXAM BP 155/100  Pulse 98  Ht 5\' 6"  (1.676 m)  Wt 132 lb (59.875 kg)  BMI 21.31 kg/m2 GENERAL:  Well appearing NECK:  No jugular venous distention, waveform within normal limits, carotid upstroke brisk and symmetric, no bruits, no thyromegaly LYMPHATICS:  No cervical, inguinal adenopathy LUNGS:  Clear to auscultation bilaterally BACK:  No CVA tenderness CHEST:  Unremarkable HEART:  PMI not displaced or sustained,S1 and S2 within normal limits, no S3, no S4, no clicks, no rubs, no murmurs ABD:  Flat, positive bowel sounds normal in frequency in pitch, no bruits, no rebound, no guarding, no midline pulsatile mass, no hepatomegaly, no splenomegaly EXT:  2 plus pulses throughout, no edema, no cyanosis no clubbing, varicose veins   ASSESSMENT AND PLAN

## 2012-01-28 NOTE — Assessment & Plan Note (Signed)
She has had no symptomatic or documented paroxysms of her atrial fibrillation since being on flecainide. She does not want to take anticoagulants. I will confirm with her ophthalmologist that her recent event was secondary to hypertension rather than embolism.

## 2012-01-28 NOTE — Assessment & Plan Note (Signed)
At this point her blood pressure for the most part is controlled restarting beta blocker. If she has any sustained blood pressures above 150 she can take an extra half of her metoprolol. She's often having readings of systolic 100 so I would not like to titrate up her maintenance dose at this time.

## 2012-01-30 ENCOUNTER — Encounter: Payer: Self-pay | Admitting: Cardiology

## 2012-02-27 ENCOUNTER — Ambulatory Visit: Payer: Medicare Other | Admitting: Cardiology

## 2012-03-12 ENCOUNTER — Encounter: Payer: Self-pay | Admitting: Cardiology

## 2012-03-26 ENCOUNTER — Encounter: Payer: Self-pay | Admitting: Cardiology

## 2012-04-04 ENCOUNTER — Telehealth: Payer: Self-pay | Admitting: Cardiology

## 2012-04-05 NOTE — Telephone Encounter (Signed)
Error

## 2012-04-21 ENCOUNTER — Other Ambulatory Visit (HOSPITAL_COMMUNITY): Payer: Self-pay | Admitting: Urology

## 2012-04-21 DIAGNOSIS — E278 Other specified disorders of adrenal gland: Secondary | ICD-10-CM

## 2012-04-21 DIAGNOSIS — C801 Malignant (primary) neoplasm, unspecified: Secondary | ICD-10-CM

## 2012-04-25 ENCOUNTER — Ambulatory Visit (HOSPITAL_COMMUNITY)
Admission: RE | Admit: 2012-04-25 | Discharge: 2012-04-25 | Disposition: A | Discharge status: Home / Self Care | Payer: Medicare Other | Source: Ambulatory Visit | Attending: Urology | Admitting: Urology

## 2012-04-25 DIAGNOSIS — E278 Other specified disorders of adrenal gland: Secondary | ICD-10-CM

## 2012-04-25 DIAGNOSIS — C801 Malignant (primary) neoplasm, unspecified: Secondary | ICD-10-CM

## 2012-04-25 DIAGNOSIS — E279 Disorder of adrenal gland, unspecified: Secondary | ICD-10-CM | POA: Insufficient documentation

## 2012-04-25 MED ORDER — GADOBENATE DIMEGLUMINE 529 MG/ML IV SOLN
12.0000 mL | Freq: Once | INTRAVENOUS | Status: AC | PRN
  Administered 2012-04-25: 12 mL via INTRAVENOUS

## 2012-05-13 ENCOUNTER — Other Ambulatory Visit (HOSPITAL_COMMUNITY): Payer: Self-pay | Admitting: Urology

## 2012-05-13 ENCOUNTER — Ambulatory Visit (HOSPITAL_COMMUNITY)
Admission: RE | Admit: 2012-05-13 | Discharge: 2012-05-13 | Disposition: A | Discharge status: Home / Self Care | Payer: Medicare Other | Source: Ambulatory Visit | Attending: Urology | Admitting: Urology

## 2012-05-13 DIAGNOSIS — D497 Neoplasm of unspecified behavior of endocrine glands and other parts of nervous system: Secondary | ICD-10-CM | POA: Insufficient documentation

## 2012-05-13 DIAGNOSIS — Z85850 Personal history of malignant neoplasm of thyroid: Secondary | ICD-10-CM | POA: Insufficient documentation

## 2012-05-13 DIAGNOSIS — I10 Essential (primary) hypertension: Secondary | ICD-10-CM | POA: Insufficient documentation

## 2012-05-14 ENCOUNTER — Other Ambulatory Visit: Payer: Self-pay | Admitting: Urology

## 2012-05-21 ENCOUNTER — Encounter: Payer: Self-pay | Admitting: Cardiology

## 2012-05-21 ENCOUNTER — Ambulatory Visit (INDEPENDENT_AMBULATORY_CARE_PROVIDER_SITE_OTHER): Payer: Medicare Other | Admitting: Cardiology

## 2012-05-21 VITALS — BP 125/70 | HR 100 | Ht 66.0 in | Wt 133.0 lb

## 2012-05-21 DIAGNOSIS — D3500 Benign neoplasm of unspecified adrenal gland: Secondary | ICD-10-CM

## 2012-05-21 DIAGNOSIS — I059 Rheumatic mitral valve disease, unspecified: Secondary | ICD-10-CM

## 2012-05-21 DIAGNOSIS — I10 Essential (primary) hypertension: Secondary | ICD-10-CM

## 2012-05-21 DIAGNOSIS — I4891 Unspecified atrial fibrillation: Secondary | ICD-10-CM

## 2012-05-21 DIAGNOSIS — R002 Palpitations: Secondary | ICD-10-CM

## 2012-05-21 MED ORDER — NIFEDIPINE ER 60 MG PO TB24: 60.0000 mg | ORAL_TABLET | Freq: Every day | ORAL | Status: DC

## 2012-05-21 NOTE — Patient Instructions (Addendum)
Please stop Lopressor Increase Nifedipine 60 mg a day  Continue all other medications as listed  Follow up with Dr Antoine Poche after your surgery.

## 2012-05-21 NOTE — Progress Notes (Signed)
HPI The patient presents for follow up of HTN.   Since I last him he is to undergo adrenalectomy. 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  She is being prepared for resection of this in August. Since I last saw her she said no new cardiovascular complaints. She is not having any chest pressure, neck or arm discomfort. She's not having any palpitations, presyncope or syncope. She's having no weight gain or edema.  Allergies  Allergen Reactions  . Acetaminophen   . Alendronate Sodium   . Celecoxib   . Codeine   . Penicillins   . Ventolin (YNW:[id number])     Current Outpatient Prescriptions  Medication Sig Dispense Refill  . aspirin 325 MG EC tablet Take 325 mg by mouth daily.        Marland Kitchen azithromycin (ZITHROMAX) 250 MG tablet Take 2 tablets by mouth on day 1, followed by 1 tablet by mouth daily for 4 days. 2 hours before proc       . Cholecalciferol (VITAMIN D3) 2000 UNITS TABS Take 1 capsule by mouth daily.        . flecainide (TAMBOCOR) 50 MG tablet Take 1 tablet (50 mg total) by mouth 2 (two) times daily.  60 tablet  6  . levothyroxine (SYNTHROID, LEVOTHROID) 137 MCG tablet Take 137 mcg by mouth daily.        . Loratadine (CLARITIN PO) Take 1 tablet by mouth daily.        . metoprolol tartrate (LOPRESSOR) 25 MG tablet Take 25 mg by mouth 2 (two) times daily. Take 1/2 in am and in pm      . Multiple Vitamins-Minerals (CENTRUM SILVER) tablet Take 1 tablet by mouth daily.        Marland Kitchen NIFEdipine (PROCARDIA-XL/ADALAT CC) 30 MG 24 hr tablet       . DISCONTD: metoprolol tartrate (LOPRESSOR) 25 MG tablet Take 0.5 tablets (12.5 mg total) by mouth as needed.  30 tablet  6    Past Medical History  Diagnosis Date  . Palpitations     piror to diagnosis of SVT   . Mitral valve prolapse     per pt report  . MR (mitral regurgitation)     per 2-d echocardiogram  . GERD (gastroesophageal reflux disease)   . Osteoarthritis   . Thyroid cancer     age 66; s/p resection with subsequent  hypothyroidism. 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 Atrial fibrillation     Past Surgical History  Procedure Date  . Thyroidectomy   . Lung biopsies   . Appendectomy   . Wrist surgery     ROS:  As stated in the HPI and negative for all other systems.  PHYSICAL EXAM BP 125/70  Pulse 100  Ht 5\' 6"  (1.676 m)  Wt 133 lb (60.328 kg)  BMI 21.47 kg/m2 GENERAL:  Well appearing NECK:  No jugular venous distention, waveform within normal limits, carotid upstroke brisk and symmetric, no bruits, no thyromegaly LYMPHATICS:  No cervical, inguinal adenopathy LUNGS:  Clear to auscultation bilaterally BACK:  No CVA tenderness CHEST:  Unremarkable HEART:  PMI not displaced or sustained,S1 and S2 within normal limits, no S3, no S4, no clicks, no rubs, no murmurs ABD:  Flat, positive bowel sounds normal in frequency in pitch, no bruits, no rebound, no guarding, no midline pulsatile mass, no hepatomegaly, no splenomegaly EXT:  2 plus pulses throughout, no edema, no cyanosis no clubbing, varicose veins  EKG:  Sinus rhythm, rate 100, left ventricular hypertrophy with repolarization changes, poor anterior R wave progression, no acute ST-T wave changes, no change from previous.  05/21/2012   ASSESSMENT AND PLAN   HYPERTENSION -  See below   ATRIAL FIBRILLATION -  She has had no symptomatic or documented paroxysms of her atrial fibrillation since being on flecainide. She does not want to take anticoagulants. I will confirm with her ophthalmologist that her recent event was secondary to hypertension rather than embolism.  PHEOCHROMOCYTOMA -  Given the diagnosis of pheochromocytoma I think it is reasonable to be weaning down of beta blocker to avoid unopposed beta blockade without alpha blockade although she has tolerated this today. As an alternative to serial alpha blockade followed by beta blockade treatment of a calcium channel blocker preoperatively is reasonable. Today I  will increase the dose to 30 mg nifedipine XL twice a day. She will discontinue her beta blocker. She'll let me know if she has a problem with this.

## 2012-05-22 ENCOUNTER — Other Ambulatory Visit: Payer: Self-pay | Admitting: *Deleted

## 2012-05-22 ENCOUNTER — Telehealth: Payer: Self-pay | Admitting: *Deleted

## 2012-05-22 MED ORDER — NIFEDIPINE ER 30 MG PO TB24: 30.0000 mg | ORAL_TABLET | Freq: Two times a day (BID) | ORAL | Status: DC

## 2012-05-22 NOTE — Telephone Encounter (Signed)
Message copied by Sharin Grave on Thu May 22, 2012 10:24 AM ------      Message from: Rollene Rotunda      Created: Wed May 21, 2012 11:03 PM       Can you please make sure that she takes her Nifedipine XL 30mg  bid instead of 60 daily

## 2012-05-22 NOTE — Telephone Encounter (Signed)
Pt aware to take Nifedipine XL 30 mg twice a day - pharmacy was contacted and new RX was called in.

## 2012-06-16 ENCOUNTER — Other Ambulatory Visit: Payer: Self-pay | Admitting: *Deleted

## 2012-06-16 MED ORDER — FLECAINIDE ACETATE 50 MG PO TABS: 50.0000 mg | ORAL_TABLET | Freq: Two times a day (BID) | ORAL | Status: DC

## 2012-06-18 ENCOUNTER — Other Ambulatory Visit: Payer: Self-pay | Admitting: Cardiology

## 2012-06-18 MED ORDER — FLECAINIDE ACETATE 50 MG PO TABS: 50.0000 mg | ORAL_TABLET | Freq: Two times a day (BID) | ORAL | Status: DC

## 2012-06-23 ENCOUNTER — Encounter (HOSPITAL_COMMUNITY): Payer: Self-pay | Admitting: Pharmacy Technician

## 2012-06-27 ENCOUNTER — Encounter (HOSPITAL_COMMUNITY)
Admission: RE | Admit: 2012-06-27 | Discharge: 2012-06-27 | Disposition: A | Discharge status: Home / Self Care | Payer: Medicare Other | Source: Ambulatory Visit | Attending: Urology | Admitting: Urology

## 2012-06-27 ENCOUNTER — Encounter (HOSPITAL_COMMUNITY): Payer: Self-pay

## 2012-06-27 HISTORY — DX: Unspecified hearing loss, right ear: H91.91

## 2012-06-27 HISTORY — DX: Unspecified cataract: H26.9

## 2012-06-27 HISTORY — DX: Headache: R51

## 2012-06-27 HISTORY — DX: Other visual disturbances: H53.8

## 2012-06-27 HISTORY — DX: Other specified postprocedural states: R11.2

## 2012-06-27 HISTORY — DX: Hypothyroidism, unspecified: E03.9

## 2012-06-27 HISTORY — DX: Other specified postprocedural states: Z98.890

## 2012-06-27 HISTORY — DX: Other specified disorders of temporomandibular joint: M26.69

## 2012-06-27 HISTORY — DX: Essential (primary) hypertension: I10

## 2012-06-27 HISTORY — DX: Other specified health status: Z78.9

## 2012-06-27 LAB — CBC
HCT: 35.4 % — ABNORMAL LOW (ref 36.0–46.0)
Hemoglobin: 12.2 g/dL (ref 12.0–15.0)
RBC: 4.11 MIL/uL (ref 3.87–5.11)
WBC: 9.5 10*3/uL (ref 4.0–10.5)

## 2012-06-27 LAB — BASIC METABOLIC PANEL
BUN: 21 mg/dL (ref 6–23)
Chloride: 97 mEq/L (ref 96–112)
GFR calc Af Amer: >90 mL/min (ref >90)
Potassium: 4.5 mEq/L (ref 3.5–5.1)
Sodium: 136 mEq/L (ref 135–145)

## 2012-06-27 LAB — SURGICAL PCR SCREEN: MRSA, PCR: POSITIVE — AB

## 2012-06-27 MED ORDER — LACTATED RINGERS IV SOLN
1000.0000 mL | INTRAVENOUS | Status: DC
  Filled 2012-06-27: qty 1000

## 2012-06-27 NOTE — Patient Instructions (Addendum)
YOUR SURGERY IS SCHEDULED ON:  Thursday  8/29  AT 11:00 AM  REPORT TO [facility] AT:  8:00 AM      PHONE # FOR SHORT STAY IS [phone]  REMEMBER TO FOLLOW YOUR BOWEL PREP INSTRUCTIONS DAY BEFORE YOUR SURGERY-INSTRUCTIONS WERE GIVEN TO DR. Vevelyn Royals OFFICE.  DO NOT EAT OR DRINK ANYTHING AFTER MIDNIGHT THE NIGHT BEFORE YOUR SURGERY.  YOU MAY BRUSH YOUR TEETH, RINSE OUT YOUR MOUTH--BUT NO WATER, NO FOOD, NO CHEWING GUM, NO MINTS, NO CANDIES, NO CHEWING TOBACCO.  PLEASE TAKE THE FOLLOWING MEDICATIONS THE AM OF YOUR SURGERY WITH A FEW SIPS OF WATER:  FLECAINIDE, LEVOTHYROXINE, PROCARDIA.  MAY USE YOUR REFRESH EYE DROPS AND BRING YOUR EYE DROPS AND EYE LUBRICANT TO THE HOSPITAL.    IF YOU USE INHALERS--USE YOUR INHALERS THE AM OF YOUR SURGERY AND BRING INHALERS TO THE HOSPITAL -TAKE TO SURGERY.    IF YOU ARE DIABETIC:  DO NOT TAKE ANY DIABETIC MEDICATIONS THE AM OF YOUR SURGERY.  IF YOU TAKE INSULIN IN THE EVENINGS--PLEASE ONLY TAKE 1/2 NORMAL EVENING DOSE THE NIGHT BEFORE YOUR SURGERY.  NO INSULIN THE AM OF YOUR SURGERY.  IF YOU HAVE SLEEP APNEA AND USE CPAP OR BIPAP--PLEASE BRING THE MASK --NOT THE MACHINE-NOT THE TUBING   -JUST THE MASK. DO NOT BRING VALUABLES, MONEY, CREDIT CARDS.  CONTACT LENS, DENTURES / PARTIALS, GLASSES SHOULD NOT BE WORN TO SURGERY AND IN MOST CASES-HEARING AIDS WILL NEED TO BE REMOVED.  BRING YOUR GLASSES CASE, ANY EQUIPMENT NEEDED FOR YOUR CONTACT LENS. FOR PATIENTS ADMITTED TO THE HOSPITAL--CHECK OUT TIME THE DAY OF DISCHARGE IS 11:00 AM.  ALL INPATIENT ROOMS ARE PRIVATE - WITH BATHROOM, TELEPHONE, TELEVISION AND WIFI INTERNET. IF YOU ARE BEING DISCHARGED THE SAME DAY OF YOUR SURGERY--YOU CAN NOT DRIVE YOURSELF HOME--AND SHOULD NOT GO HOME ALONE BY TAXI OR BUS.  NO DRIVING OR OPERATING MACHINERY FOR 24 HOURS FOLLOWING ANESTHESIA / PAIN MEDICATIONS.                            SPECIAL INSTRUCTIONS:  CHLORHEXIDINE SOAP SHOWER (other brand names are  Betasept and Hibiclens ) PLEASE SHOWER WITH CHLORHEXIDINE THE NIGHT BEFORE YOUR SURGERY AND THE AM OF YOUR SURGERY. DO NOT USE CHLORHEXIDINE ON YOUR FACE OR PRIVATE AREAS--YOU MAY USE YOUR NORMAL SOAP THOSE AREAS AND YOUR NORMAL SHAMPOO.  WOMEN SHOULD AVOID SHAVING UNDER ARMS AND SHAVING LEGS 48 HOURS BEFORE USING CHLORHEXIDINE TO AVOID SKIN IRRITATION.  DO NOT USE IF ALLERGIC TO CHLORHEXIDINE.  PLEASE READ OVER ANY  FACT SHEETS THAT YOU WERE GIVEN: MRSA INFORMATION, BLOOD TRANSFUSION INFORMATION, INCENTIVE SPIROMETER INFORMATION.

## 2012-06-27 NOTE — Anesthesia Preprocedure Evaluation (Addendum)
Anesthesia Evaluation  Patient identified by MRN, date of birth, ID band Patient awake  General Assessment Comment:Pheochromocytoma. Dr. Antoine Poche from cardiology has increased nifedipine dose and stopped metoprolol starting 05/21/12.  Reviewed: Allergy & Precautions, H&P , NPO status , Patient's Chart, lab work & pertinent test results  History of Anesthesia Complications (+) PONV  Airway Mallampati: II TM Distance: >3 FB Neck ROM: Full   Comment: Jaw has dislocated twice at the dentist. Dental  (+) Caps and Dental Advisory Given   Pulmonary neg pulmonary ROS,  breath sounds clear to auscultation  Pulmonary exam normal       Cardiovascular Exercise Tolerance: Good hypertension, Pt. on medications + dysrhythmias Atrial Fibrillation Rhythm:Regular Rate:Normal  Mitral valve prolapse.   Neuro/Psych  Headaches, negative psych ROS   GI/Hepatic Neg liver ROS, GERD-  Medicated,  Endo/Other  Hypothyroidism   Renal/GU negative Renal ROS  negative genitourinary   Musculoskeletal negative musculoskeletal ROS (+)   Abdominal   Peds negative pediatric ROS (+)  Hematology negative hematology ROS (+)   Anesthesia Other Findings   Reproductive/Obstetrics negative OB ROS                         Anesthesia Physical Anesthesia Plan  ASA: III  Anesthesia Plan: General   Post-op Pain Management:    Induction: Intravenous  Airway Management Planned: Oral ETT  Additional Equipment: Arterial line  Intra-op Plan:   Post-operative Plan: Extubation in OR  Informed Consent: I have reviewed the patients History and Physical, chart, labs and discussed the procedure including the risks, benefits and alternatives for the proposed anesthesia with the patient or authorized representative who has indicated his/her understanding and acceptance.   Dental advisory given  Plan Discussed with: CRNA  Anesthesia Plan  Comments: (Plan arterial line and large bore IVs.)       Anesthesia Quick Evaluation

## 2012-06-27 NOTE — Pre-Procedure Instructions (Signed)
PREOP CBC, BMET WERE DONE TODAY AT [facility] AS PER GUIDELINES ANESTHESIOLOGIST AND ORDERS DR. BORDEN.  PT HAS CXR IN EPIC FROM 05/13/12 AND EKG REPORT 05/21/12 WITH OFFICE NOTE FROM CARDIOLOGIST DR. HOCHREIN IN EPIC.   NUCLEAR STRESS TEST REPORT 04/28/11 AND ECHO REPORT 03/28/10 ALSO IN EPIC. PREOP INSTRUCTIONS DISCUSSED WITH PT USING THE TEACH  BACK METHOD.

## 2012-07-02 NOTE — H&P (Signed)
  Chief Complaint  Right suprarenal mass   History of Present Illness  [name] is a 66 year old female who with a 7.8 cm right adrenal mass suspicious for a pheochromocytoma based on biochemical evaluation.  Past Medical History Problems  1. History of  Atrial Fibrillation 427.31 2. History of  Heart Disease 429.9 3. History of  Heartburn 787.1 4. History of  Hypertension 401.9 5. History of  Hypothyroidism 244.9 6. History of  Mitral Valve Disorder 424.0 7. History of  Thyroid Cancer V10.87  Surgical History Problems  1. History of  Appendectomy 2. History of  Open Lung Biopsy 3. History of  Thyroid Surgery 4. History of  Wrist Surgery  Current Meds 1. Aspirin 325 MG Oral Tablet; Therapy: (Recorded:05Jun2013) to 2. Azithromycin 250 MG Oral Tablet; Prior to dental work; Therapy: 20Jun2012 to 3. Benicar TABS; Therapy: (Recorded:05Jun2013) to 4. Claritin CAPS; Therapy: (Recorded:05Jun2013) to 5. Claritin CAPS; Therapy: (Recorded:07Jun2013) to 6. Flecainide Acetate 50 MG Oral Tablet; Therapy: 18Jan2013 to 7. Lutein TABS; Therapy: (Recorded:07Jun2013) to 8. Metoprolol Tartrate TABS; Therapy: (Recorded:05Jun2013) to 9. Multi-Day TABS; Therapy: (Recorded:07Jun2013) to 10. Synthroid 137 MCG Oral Tablet; Therapy: (Recorded:05Jun2013) to 11. Vitamin D TABS; Therapy: (Recorded:07Jun2013) to  Allergies Medication  1. Albuterol AERS 2. Acetaminophen TABS 3. CeleBREX CAPS 4. Codeine Derivatives 5. Fosamax TABS 6. Penicillins 1  7. Tylenol TABS 8. Ventolin AERS  1. Amended By: Heloise Purpura; 05/13/2012 7:40 PMEST   Family History Problems  1. Maternal history of  Heart Disease V17.49 2. Paternal history of  Leukemia V16.6 3. Paternal history of  Nephrolithiasis 4. Maternal history of  Renal Failure  Social History Problems    Marital History - Currently Married   Never A Smoker   Occupation: retired Psychologist, sport and exercise Denied    History of  Alcohol Use   History of   Tobacco Use  Review of Systems Constitutional, skin, eye, otolaryngeal, hematologic/lymphatic, cardiovascular, pulmonary, endocrine, musculoskeletal, gastrointestinal, neurological and psychiatric system(s) were reviewed and pertinent findings if present are noted.  Genitourinary: no hematuria.  Constitutional: no night sweats and no recent weight loss.  Cardiovascular: no chest pain and no leg swelling.  Respiratory: no shortness of breath.  Endocrine: no polydipsia.  Neurological: headache.    Vitals Vital Signs [Data Includes: Last 1 Day]  09Jul2013 08:53AM  Blood Pressure: 145 / 64 Heart Rate: 88  Physical Exam Constitutional: Well nourished and well developed . No acute distress.  ENT:. The ears and nose are normal in appearance.  Neck: The appearance of the neck is normal and no neck mass is present.  Pulmonary: No respiratory distress, normal respiratory rhythm and effort and clear bilateral breath sounds.  Cardiovascular: Heart rate and rhythm are normal . No peripheral edema.  Abdomen: The abdomen is soft and nontender. No masses are palpated. No CVA tenderness. No hernias are palpable. No hepatosplenomegaly noted.  Lymphatics: The supraclavicular, femoral and inguinal nodes are not enlarged or tender.  Skin: Normal skin turgor, no visible rash and no visible skin lesions.  Neuro/Psych:. Mood and affect are appropriate.     Assessment Assessed  1. 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 Right suprarenal mass concerning for pheochromocytoma: She has undergone preoperative peripheral vasodilation and has been taken off her beta blocker per Dr. Antoine Poche 6 weeks ago.  She was administered IV fluids throughout the morning and will proceed with right adrenalectomy for her suspected pheochromocytoma.

## 2012-07-03 ENCOUNTER — Inpatient Hospital Stay (HOSPITAL_COMMUNITY)
Admission: RE | Admit: 2012-07-03 | Discharge: 2012-07-07 | DRG: 614 | Disposition: A | Discharge status: Home / Self Care | Payer: Medicare Other | Source: Ambulatory Visit | Attending: Urology | Admitting: Urology

## 2012-07-03 ENCOUNTER — Encounter (HOSPITAL_COMMUNITY): Payer: Self-pay | Admitting: *Deleted

## 2012-07-03 ENCOUNTER — Ambulatory Visit (HOSPITAL_COMMUNITY): Payer: Medicare Other | Admitting: Anesthesiology

## 2012-07-03 ENCOUNTER — Encounter (HOSPITAL_COMMUNITY)
Admission: RE | Disposition: A | Discharge status: Home / Self Care | Payer: Self-pay | Source: Ambulatory Visit | Attending: Urology

## 2012-07-03 ENCOUNTER — Encounter (HOSPITAL_COMMUNITY): Payer: Self-pay | Admitting: Anesthesiology

## 2012-07-03 DIAGNOSIS — I4891 Unspecified atrial fibrillation: Secondary | ICD-10-CM | POA: Diagnosis present

## 2012-07-03 DIAGNOSIS — R002 Palpitations: Secondary | ICD-10-CM

## 2012-07-03 DIAGNOSIS — E876 Hypokalemia: Secondary | ICD-10-CM

## 2012-07-03 DIAGNOSIS — R000 Tachycardia, unspecified: Secondary | ICD-10-CM

## 2012-07-03 DIAGNOSIS — Z01812 Encounter for preprocedural laboratory examination: Secondary | ICD-10-CM

## 2012-07-03 DIAGNOSIS — D62 Acute posthemorrhagic anemia: Secondary | ICD-10-CM | POA: Diagnosis not present

## 2012-07-03 DIAGNOSIS — I472 Ventricular tachycardia, unspecified: Secondary | ICD-10-CM | POA: Diagnosis not present

## 2012-07-03 DIAGNOSIS — I4729 Other ventricular tachycardia: Secondary | ICD-10-CM | POA: Diagnosis not present

## 2012-07-03 DIAGNOSIS — D3500 Benign neoplasm of unspecified adrenal gland: Principal | ICD-10-CM | POA: Diagnosis present

## 2012-07-03 HISTORY — PX: ADRENALECTOMY: SHX876

## 2012-07-03 LAB — BLOOD GAS, ARTERIAL
FIO2: 1 %
O2 Saturation: 99.9 %
pCO2 arterial: 35.4 mmHg (ref 35.0–45.0)
pO2, Arterial: 386 mmHg — ABNORMAL HIGH (ref 80.0–100.0)

## 2012-07-03 LAB — BASIC METABOLIC PANEL
CO2: 30 mEq/L (ref 19–32)
Glucose, Bld: 114 mg/dL — ABNORMAL HIGH (ref 70–99)
Potassium: 3.7 mEq/L (ref 3.5–5.1)
Sodium: 139 mEq/L (ref 135–145)

## 2012-07-03 LAB — HEMOGLOBIN AND HEMATOCRIT, BLOOD
HCT: 22.7 % — ABNORMAL LOW (ref 36.0–46.0)
Hemoglobin: 7.8 g/dL — ABNORMAL LOW (ref 12.0–15.0)

## 2012-07-03 LAB — GLUCOSE, CAPILLARY: Glucose-Capillary: 136 mg/dL — ABNORMAL HIGH (ref 70–99)

## 2012-07-03 SURGERY — ADRENALECTOMY
Anesthesia: General | Site: Abdomen | Laterality: Right | Wound class: Clean

## 2012-07-03 MED ORDER — HYDROMORPHONE HCL PF 1 MG/ML IJ SOLN
INTRAMUSCULAR | Status: AC
  Filled 2012-07-03: qty 1

## 2012-07-03 MED ORDER — VANCOMYCIN HCL 1000 MG IV SOLR
1000.0000 mg | INTRAVENOUS | Status: DC | PRN
  Administered 2012-07-03: 1000 mg via INTRAVENOUS

## 2012-07-03 MED ORDER — MEPERIDINE HCL 50 MG/ML IJ SOLN: 6.2500 mg | INTRAMUSCULAR | Status: DC | PRN

## 2012-07-03 MED ORDER — DEXTROSE 5 % IV SOLN
0.2500 ug/kg/min | INTRAVENOUS | Status: DC
  Filled 2012-07-03: qty 2

## 2012-07-03 MED ORDER — ROCURONIUM BROMIDE 100 MG/10ML IV SOLN
INTRAVENOUS | Status: DC | PRN
  Administered 2012-07-03: 10 mg via INTRAVENOUS
  Administered 2012-07-03: 50 mg via INTRAVENOUS

## 2012-07-03 MED ORDER — PHENYLEPHRINE HCL 10 MG/ML IJ SOLN: 30.0000 ug/min | INTRAVENOUS | Status: DC

## 2012-07-03 MED ORDER — DIPHENHYDRAMINE HCL 50 MG/ML IJ SOLN
12.5000 mg | Freq: Four times a day (QID) | INTRAMUSCULAR | Status: DC | PRN
  Filled 2012-07-03: qty 1

## 2012-07-03 MED ORDER — DIPHENHYDRAMINE HCL 50 MG/ML IJ SOLN: 12.5000 mg | Freq: Four times a day (QID) | INTRAMUSCULAR | Status: DC | PRN

## 2012-07-03 MED ORDER — MAGNESIUM SULFATE 50 % IJ SOLN
INTRAMUSCULAR | Status: DC | PRN
  Administered 2012-07-03: 2 g via INTRAVENOUS

## 2012-07-03 MED ORDER — FLECAINIDE ACETATE 50 MG PO TABS
50.0000 mg | ORAL_TABLET | Freq: Two times a day (BID) | ORAL | Status: DC
  Administered 2012-07-03 – 2012-07-07 (×8): 50 mg via ORAL
  Filled 2012-07-03 (×12): qty 1

## 2012-07-03 MED ORDER — ACETAMINOPHEN 10 MG/ML IV SOLN
1000.0000 mg | Freq: Four times a day (QID) | INTRAVENOUS | Status: DC
  Filled 2012-07-03 (×2): qty 100

## 2012-07-03 MED ORDER — VANCOMYCIN HCL IN DEXTROSE 1-5 GM/200ML-% IV SOLN
1000.0000 mg | Freq: Two times a day (BID) | INTRAVENOUS | Status: AC
  Administered 2012-07-03: 1000 mg via INTRAVENOUS
  Filled 2012-07-03: qty 200

## 2012-07-03 MED ORDER — ONDANSETRON HCL 4 MG/2ML IJ SOLN: 4.0000 mg | Freq: Four times a day (QID) | INTRAMUSCULAR | Status: DC | PRN

## 2012-07-03 MED ORDER — LACTATED RINGERS IV SOLN: INTRAVENOUS | Status: DC

## 2012-07-03 MED ORDER — FENTANYL CITRATE 0.05 MG/ML IJ SOLN
INTRAMUSCULAR | Status: DC | PRN
  Administered 2012-07-03 (×3): 50 ug via INTRAVENOUS
  Administered 2012-07-03: 100 ug via INTRAVENOUS

## 2012-07-03 MED ORDER — OXYCODONE HCL 5 MG PO TABS: 5.0000 mg | ORAL_TABLET | Freq: Once | ORAL | Status: DC | PRN

## 2012-07-03 MED ORDER — PHENYLEPHRINE HCL 10 MG/ML IJ SOLN
10.0000 mg | INTRAVENOUS | Status: DC | PRN
  Administered 2012-07-03: 15 ug/min via INTRAVENOUS

## 2012-07-03 MED ORDER — CARBOXYMETHYLCELLULOSE SODIUM 0.5 % OP SOLN: 1.0000 [drp] | Freq: Two times a day (BID) | OPHTHALMIC | Status: DC

## 2012-07-03 MED ORDER — NITROPRUSSIDE SODIUM 25 MG/ML IV SOLN
50000.0000 ug | INTRAVENOUS | Status: DC | PRN
  Administered 2012-07-03: .2 ug/kg/min via INTRAVENOUS
  Administered 2012-07-03: .4 ug/kg/min via INTRAVENOUS

## 2012-07-03 MED ORDER — HYDROMORPHONE 0.3 MG/ML IV SOLN
INTRAVENOUS | Status: DC
  Administered 2012-07-03: 15:00:00 via INTRAVENOUS
  Administered 2012-07-03: 0.2 mg via INTRAVENOUS
  Administered 2012-07-04: 0.4 mg via INTRAVENOUS
  Administered 2012-07-04: 0.799 mg via INTRAVENOUS
  Administered 2012-07-04: 0.2 mg via INTRAVENOUS
  Administered 2012-07-04: 0.199 mg via INTRAVENOUS
  Administered 2012-07-04 (×3): 0.2 mg via INTRAVENOUS
  Administered 2012-07-05 (×2): 0.199 mg via INTRAVENOUS
  Administered 2012-07-05 (×2): 0.4 mg via INTRAVENOUS
  Administered 2012-07-06: 0.2 mg via INTRAVENOUS
  Administered 2012-07-06: 0.3 mg via INTRAVENOUS
  Administered 2012-07-06: 0.4 mg via INTRAVENOUS

## 2012-07-03 MED ORDER — LACTATED RINGERS IV SOLN
INTRAVENOUS | Status: DC
  Administered 2012-07-03 (×3): via INTRAVENOUS

## 2012-07-03 MED ORDER — BUPIVACAINE LIPOSOME 1.3 % IJ SUSP
20.0000 mL | INTRAMUSCULAR | Status: AC
  Administered 2012-07-03: 20 mL
  Filled 2012-07-03: qty 20

## 2012-07-03 MED ORDER — OXYCODONE HCL 5 MG/5ML PO SOLN
5.0000 mg | Freq: Once | ORAL | Status: DC | PRN
  Filled 2012-07-03: qty 5

## 2012-07-03 MED ORDER — MIDAZOLAM HCL 5 MG/5ML IJ SOLN
INTRAMUSCULAR | Status: DC | PRN
  Administered 2012-07-03 (×2): 1 mg via INTRAVENOUS

## 2012-07-03 MED ORDER — POLYVINYL ALCOHOL 1.4 % OP SOLN
1.0000 [drp] | Freq: Two times a day (BID) | OPHTHALMIC | Status: DC
  Administered 2012-07-04 – 2012-07-07 (×6): 1 [drp] via OPHTHALMIC
  Filled 2012-07-03: qty 15

## 2012-07-03 MED ORDER — DIPHENHYDRAMINE HCL 12.5 MG/5ML PO ELIX
12.5000 mg | ORAL_SOLUTION | Freq: Four times a day (QID) | ORAL | Status: DC | PRN
  Filled 2012-07-03: qty 5

## 2012-07-03 MED ORDER — HYDROMORPHONE HCL PF 1 MG/ML IJ SOLN
0.2500 mg | INTRAMUSCULAR | Status: DC | PRN
  Administered 2012-07-03: 0.5 mg via INTRAVENOUS
  Administered 2012-07-03 (×2): 0.25 mg via INTRAVENOUS
  Administered 2012-07-03: 0.5 mg via INTRAVENOUS

## 2012-07-03 MED ORDER — 0.9 % SODIUM CHLORIDE (POUR BTL) OPTIME
TOPICAL | Status: DC | PRN
  Administered 2012-07-03: 3000 mL

## 2012-07-03 MED ORDER — HYDROMORPHONE 0.3 MG/ML IV SOLN
INTRAVENOUS | Status: AC
  Administered 2012-07-03: 0.3 mg via INTRAVENOUS
  Filled 2012-07-03: qty 25

## 2012-07-03 MED ORDER — NALOXONE HCL 0.4 MG/ML IJ SOLN: 0.4000 mg | INTRAMUSCULAR | Status: DC | PRN

## 2012-07-03 MED ORDER — LACTATED RINGERS IV SOLN
INTRAVENOUS | Status: DC | PRN
  Administered 2012-07-03 (×3): via INTRAVENOUS

## 2012-07-03 MED ORDER — ONDANSETRON HCL 4 MG/2ML IJ SOLN: 4.0000 mg | INTRAMUSCULAR | Status: DC | PRN

## 2012-07-03 MED ORDER — HYDROMORPHONE HCL PF 1 MG/ML IJ SOLN
INTRAMUSCULAR | Status: DC | PRN
  Administered 2012-07-03: 0.5 mg via INTRAVENOUS
  Administered 2012-07-03: 1 mg via INTRAVENOUS
  Administered 2012-07-03: 0.5 mg via INTRAVENOUS

## 2012-07-03 MED ORDER — DOCUSATE SODIUM 100 MG PO CAPS
100.0000 mg | ORAL_CAPSULE | Freq: Two times a day (BID) | ORAL | Status: DC
  Administered 2012-07-04 – 2012-07-07 (×7): 100 mg via ORAL
  Filled 2012-07-03 (×10): qty 1

## 2012-07-03 MED ORDER — ESMOLOL HCL-SODIUM CHLORIDE 2000 MG/100ML IV SOLN
25.0000 ug/kg/min | INTRAVENOUS | Status: DC
  Filled 2012-07-03: qty 100

## 2012-07-03 MED ORDER — SODIUM CHLORIDE 0.9 % IJ SOLN: 9.0000 mL | INTRAMUSCULAR | Status: DC | PRN

## 2012-07-03 MED ORDER — VANCOMYCIN HCL IN DEXTROSE 1-5 GM/200ML-% IV SOLN: 1000.0000 mg | INTRAVENOUS | Status: DC

## 2012-07-03 MED ORDER — DEXTROSE-NACL 5-0.45 % IV SOLN
INTRAVENOUS | Status: DC
  Administered 2012-07-03: 15:00:00 via INTRAVENOUS
  Administered 2012-07-03: 1000 mL via INTRAVENOUS

## 2012-07-03 MED ORDER — LEVOTHYROXINE SODIUM 137 MCG PO TABS
137.0000 ug | ORAL_TABLET | Freq: Every day | ORAL | Status: DC
  Administered 2012-07-04 – 2012-07-07 (×4): 137 ug via ORAL
  Filled 2012-07-03 (×6): qty 1

## 2012-07-03 MED ORDER — MAGNESIUM SULFATE 50 % IJ SOLN
2.0000 g | INTRAMUSCULAR | Status: DC
  Filled 2012-07-03: qty 4

## 2012-07-03 MED ORDER — MAGNESIUM SULFATE 40 MG/ML IJ SOLN
2.0000 g | INTRAMUSCULAR | Status: DC
  Administered 2012-07-03: 2 g via INTRAVENOUS
  Filled 2012-07-03: qty 50

## 2012-07-03 MED ORDER — REFRESH LACRI-LUBE OP OINT
1.0000 [in_us] | TOPICAL_OINTMENT | Freq: Every day | OPHTHALMIC | Status: DC
  Administered 2012-07-03: 1 [in_us] via OPHTHALMIC
  Filled 2012-07-03 (×2): qty 1

## 2012-07-03 MED ORDER — VASOPRESSIN 20 UNIT/ML IJ SOLN
INTRAMUSCULAR | Status: AC
  Filled 2012-07-03: qty 1

## 2012-07-03 MED ORDER — ESMOLOL HCL 10 MG/ML IV SOLN
INTRAVENOUS | Status: DC | PRN
  Administered 2012-07-03 (×2): 20 mg via INTRAVENOUS
  Administered 2012-07-03 (×2): 10 mg via INTRAVENOUS

## 2012-07-03 MED ORDER — PROMETHAZINE HCL 25 MG/ML IJ SOLN: 6.2500 mg | INTRAMUSCULAR | Status: DC | PRN

## 2012-07-03 MED ORDER — ZOLPIDEM TARTRATE 5 MG PO TABS: 5.0000 mg | ORAL_TABLET | Freq: Every evening | ORAL | Status: DC | PRN

## 2012-07-03 MED ORDER — LIDOCAINE HCL (CARDIAC) 20 MG/ML IV SOLN
INTRAVENOUS | Status: DC | PRN
  Administered 2012-07-03: 120 mg via INTRAVENOUS
  Administered 2012-07-03 (×2): 50 mg via INTRAVENOUS

## 2012-07-03 MED ORDER — GLYCOPYRROLATE 0.2 MG/ML IJ SOLN
INTRAMUSCULAR | Status: DC | PRN
  Administered 2012-07-03: .6 mg via INTRAVENOUS

## 2012-07-03 MED ORDER — NEOSTIGMINE METHYLSULFATE 1 MG/ML IJ SOLN
INTRAMUSCULAR | Status: DC | PRN
  Administered 2012-07-03: 3 mg via INTRAVENOUS

## 2012-07-03 SURGICAL SUPPLY — 64 items
ATTRACTOMAT 16X20 MAGNETIC DRP (DRAPES) ×2 IMPLANT
BAG ISL DRAPE 18X18 STRL (DRAPES) ×2
BAG ISOLATION DRAPE 18X18 (DRAPES) ×1 IMPLANT
BLADE EXTENDED COATED 6.5IN (ELECTRODE) ×2 IMPLANT
BLADE SURG SZ20 CARB STEEL (BLADE) ×2 IMPLANT
CANISTER SUCTION 2500CC (MISCELLANEOUS) ×2 IMPLANT
CHLORAPREP W/TINT 26ML (MISCELLANEOUS) ×2 IMPLANT
CLIP LIGATING HEM O LOK PURPLE (MISCELLANEOUS) ×7 IMPLANT
CLIP LIGATING HEMO O LOK GREEN (MISCELLANEOUS) ×5 IMPLANT
CLIP SUT LAPRA TY ABSORB (SUTURE) ×2 IMPLANT
CLIP TI MEDIUM 6 (CLIP) ×3 IMPLANT
CLOTH BEACON ORANGE TIMEOUT ST (SAFETY) ×2 IMPLANT
COVER SURGICAL LIGHT HANDLE (MISCELLANEOUS) ×2 IMPLANT
DECANTER SPIKE VIAL GLASS SM (MISCELLANEOUS) IMPLANT
DISSECTOR ROUND CHERRY 3/8 STR (MISCELLANEOUS) ×2 IMPLANT
DRAIN CHANNEL 15F RND FF 3/16 (WOUND CARE) ×1 IMPLANT
DRAIN PENROSE 18X1/2 LTX STRL (DRAIN) IMPLANT
DRAPE INCISE IOBAN 66X45 STRL (DRAPES) ×2 IMPLANT
DRAPE ISOLATION BAG 18X18 (DRAPES) ×2
DRAPE LAPAROSCOPIC ABDOMINAL (DRAPES) ×1 IMPLANT
DRAPE LAPAROTOMY TRNSV 102X78 (DRAPE) IMPLANT
DRAPE SLUSH/WARMER DISC (DRAPES) ×2 IMPLANT
DRAPE TABLE BACK 44X90 PK DISP (DRAPES) ×1 IMPLANT
DRAPE WARM FLUID 44X44 (DRAPE) ×2 IMPLANT
ELECT REM PT RETURN 9FT ADLT (ELECTROSURGICAL) ×4
ELECTRODE REM PT RTRN 9FT ADLT (ELECTROSURGICAL) ×2 IMPLANT
EVACUATOR DRAINAGE 7X20 100CC (MISCELLANEOUS) ×1 IMPLANT
EVACUATOR SILICONE 100CC (DRAIN) IMPLANT
EVACUATOR SILICONE 100CC (MISCELLANEOUS)
FLOSEAL 10ML (HEMOSTASIS) ×1 IMPLANT
GAUZE SPONGE 4X4 16PLY XRAY LF (GAUZE/BANDAGES/DRESSINGS) ×2 IMPLANT
GLOVE BIOGEL M STRL SZ7.5 (GLOVE) ×2 IMPLANT
GOWN STRL NON-REIN LRG LVL3 (GOWN DISPOSABLE) ×6 IMPLANT
HEMOSTAT SURGICEL 4X8 (HEMOSTASIS) ×3 IMPLANT
KIT BASIN OR (CUSTOM PROCEDURE TRAY) ×2 IMPLANT
LOOP VESSEL MAXI BLUE (MISCELLANEOUS) ×2 IMPLANT
NS IRRIG 1000ML POUR BTL (IV SOLUTION) ×1 IMPLANT
PACK GENERAL/GYN (CUSTOM PROCEDURE TRAY) ×2 IMPLANT
POSITIONER SURGICAL ARM (MISCELLANEOUS) ×4 IMPLANT
SPONGE GAUZE 4X4 12PLY (GAUZE/BANDAGES/DRESSINGS) ×4 IMPLANT
SPONGE LAP 18X18 X RAY DECT (DISPOSABLE) ×5 IMPLANT
SPONGE SURGIFOAM ABS GEL 100 (HEMOSTASIS) IMPLANT
STAPLER VISISTAT 35W (STAPLE) ×2 IMPLANT
SUT ETHILON 3 0 PS 1 (SUTURE) IMPLANT
SUT MON AB 2-0 SH 27 (SUTURE) ×1 IMPLANT
SUT MON AB 2-0 SH27 (SUTURE) ×1 IMPLANT
SUT PDS AB 1 TP1 96 (SUTURE) ×4 IMPLANT
SUT SILK 0 (SUTURE) ×2
SUT SILK 0 30XBRD TIE 6 (SUTURE) ×1 IMPLANT
SUT SILK 2 0 (SUTURE) ×2
SUT SILK 2-0 30XBRD TIE 12 (SUTURE) ×1 IMPLANT
SUT VIC AB 2-0 SH 27 (SUTURE) ×2
SUT VIC AB 2-0 SH 27X BRD (SUTURE) ×4 IMPLANT
SUT VIC AB 3-0 SH 27 (SUTURE) ×2
SUT VIC AB 3-0 SH 27X BRD (SUTURE) ×1 IMPLANT
SUT VIC AB 4-0 RB1 27 (SUTURE)
SUT VIC AB 4-0 RB1 27XBRD (SUTURE) ×2 IMPLANT
SYRINGE 10CC LL (SYRINGE) ×2 IMPLANT
TAPE CLOTH SURG 4X10 WHT LF (GAUZE/BANDAGES/DRESSINGS) ×2 IMPLANT
TOWEL OR 17X26 10 PK STRL BLUE (TOWEL DISPOSABLE) ×4 IMPLANT
TOWEL OR NON WOVEN STRL DISP B (DISPOSABLE) ×2 IMPLANT
TRAY FOLEY CATH 14FRSI W/METER (CATHETERS) ×2 IMPLANT
TUBING CONNECTING 10 (TUBING) ×2 IMPLANT
WATER STERILE IRR 1500ML POUR (IV SOLUTION) ×2 IMPLANT

## 2012-07-03 NOTE — Op Note (Signed)
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 Rolly Salter, Jr.  Asst.: Pecola Leisure, PA-C   Anesthesia: General  Complications: None  EBL: 400 cc  Intravenous fluids: 5100 cc of crystalloid  Indication: 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 After a thorough discussion regarding treatment options, she did elect to proceed with a right adrenalectomy. She underwent preoperative peripheral vasodilation and her beta blocker was discontinued. Appropriate preoperative preparations were made from an anesthesia standpoint to manage hemodynamic instability. We have reviewed the potential risks, complications, alternative options, and expected recovery process associated with the above procedure. She gives her informed consent to proceed.  Description of procedure:  The patient was taken to the operating room and a general anesthetic was administered. An arterial line was placed preoperatively for continuous blood pressure monitoring. She was placed in the supine position with the table slightly flexed and was prepped and draped in the usual sterile fashion. IV vancomycin was administered preoperatively. A preoperative timeout was performed.  A right subcostal incision was then made approximately 2 finger breaths below the costal margin. This was carried down through the subcutaneous tissues in the anterior rectus fascia was divided. The rectus muscle was then divided and the posterior rectus sheath was sharply incised allowing entry into the peritoneal cavity under direct vision. The abdominal muscle and fascial layers were then opened along the length of the incision. A self-retaining  Bookwalter retractor was placed.  The colon was then mobilized medially allowing exposure of the retroperitoneum along with the kidney and adrenal mass. Care was taken to avoid manipulation of the adrenal mass. The duodenum was mobilized medially off the inferior vena cava which was exposed. Dissection then proceeded along the inferior vena cava at the level of the renal vein. Drugs fascia was entered superiorly and in the fatty tissue between the superior pole of the kidney and the adrenal gland was divided with electrocautery. The kidney was able to completely mobilize with a combination of sharp and blunt dissection. It was therefore able to be separated from the adrenal mass. The adrenal mass was noted be densely adherent to the empiric vena cava and dissection proceeded cautiously. She was noted to have labile blood pressure readings during the operation which were managed medically by the anesthesia team. However, her blood pressure never reached a systolic level of 200 mm of mercury.   The adrenal mass was then carefully dissected away from the inferior vena cava with hemoclips used along the inferior vena cava. There were small veins which appeared to be draining into the inferior vena cava which were prospectively identified and divided with hemoclips. Once the inferior aspect of the adrenal gland had been separated from the kidney and dissection proceeded superiorly along the medial aspect of the adrenal mass next to the inferior vena cava, this allowed the adrenal mass to carefully we separated away from the inferior vena cava. Again, hemoclips were used to divide tissue pedicles along the inferior vena cava. The peritoneum had been divided between the adrenal mass and the liver allowing the liver and gallbladder to be retracted superiorly. Larger 10 mm hemoclips were used to ligate the tissue toward the superior aspect of the adrenal mass and a normal adrenal gland which was able to be identified and  had been pushed superomedially.  The remaining superior lateral attachments were then separated with Bovie electrocautery. The mass was removed. The patient is blood pressure was noted to decrease and she was able to be taken off nitroprusside. She was administered intravenous fluids and a small amount of phenylephrine which was subsequently able to be discontinued. He then maintained her blood pressure well without any additional medication for the remainder of the case. In addition, she was noted to develop ventricular tachycardia twice during the operation. This was managed with lidocaine and magnesium per the anesthesia team. She did not develop a pulseless rhythm and did not become hemodynamically unstable.  The adrenal fossa was then copiously irrigated with sterile saline. Hemostasis was ensured. The kidney was placed back into its normal anatomic position and the column was placed back into the right upper quadrant. Preparations were then made for closure. A looped #1 PDS suture was used to close the transversus abdominis, internal oblique, and posterior rectus sheath layers. A separate loop #1 PDS suture was then used to close the external oblique and anterior rectus sheath layers. The superficial wound was then copiously irrigated. The skin was reapproximated with staples after 80 cc of Exparel had been injected into the muscular and subcutaneous layers. She tolerated the procedure well and without apparent complications. She was able to be extubated and transferred to the recovery unit in satisfactory condition.

## 2012-07-03 NOTE — Anesthesia Postprocedure Evaluation (Signed)
Anesthesia Post Note  Patient: [name]  Procedure(s) Performed: Procedure(s) (LRB): ADRENALECTOMY (Right)  Anesthesia type: General  Patient location: PACU  Post pain: Pain level controlled  Post assessment: Post-op Vital signs reviewed  Last Vitals: BP 106/49  Pulse 89  Temp 36.1 C (Oral)  Resp 12  Wt 128 lb 12 oz (58.4 kg)  SpO2 100%  Post vital signs: Reviewed  Level of consciousness: sedated  Complications: No apparent anesthesia complications

## 2012-07-03 NOTE — Progress Notes (Signed)
Post-op note  Subjective: The patient is doing well.  No complaints. She is currently hemodynamically stable off all vasopressors.  Objective: Vital signs in last 24 hours: Temp:  [96.8 F (36 C)-97 F (36.1 C)] 97 F (36.1 C) (08/29 1600) Pulse Rate:  [83-105] 87  (08/29 1700) Resp:  [12-18] 15  (08/29 1700) BP: (106-130)/(49-72) 106/49 mmHg (08/29 1530) SpO2:  [97 %-100 %] 99 % (08/29 1700) Arterial Line BP: (95-125)/(42-49) 95/42 mmHg (08/29 1700) Weight:  [58.4 kg (128 lb 12 oz)] 58.4 kg (128 lb 12 oz) (08/29 0900)  Intake/Output from previous day:   Intake/Output this shift: Total I/O In: 5550 [I.V.:5300; IV Piggyback:250] Out: 2400 [Urine:2000; Blood:400]  Physical Exam:  General: Alert and oriented. Abdomen: Soft, Nondistended. Incisions: Dressing is dry.  Lab Results:  Basename 07/03/12 1600  HGB 7.8*  HCT 22.7*    Assessment/Plan: POD#0   1) Monitor closely in ICU for 24 hrs   Moody Bruins. MD   LOS: 0 days   Laneice Meneely,LES 07/03/2012, 5:20 PM

## 2012-07-03 NOTE — Transfer of Care (Signed)
Immediate Anesthesia Transfer of Care Note  Patient: [name]  Procedure(s) Performed: Procedure(s) (LRB): ADRENALECTOMY (Right)  Patient Location: PACU  Anesthesia Type: General  Level of Consciousness: sedated, patient cooperative and responds to stimulaton  Airway & Oxygen Therapy: Patient Spontanous Breathing and Patient connected to face mask oxgen  Post-op Assessment: Report given to PACU RN and Post -op Vital signs reviewed and stable  Post vital signs: Reviewed and stable  Complications: No apparent anesthesia complications

## 2012-07-04 ENCOUNTER — Encounter (HOSPITAL_COMMUNITY): Payer: Self-pay | Admitting: Urology

## 2012-07-04 DIAGNOSIS — R002 Palpitations: Secondary | ICD-10-CM

## 2012-07-04 LAB — BASIC METABOLIC PANEL
BUN: 9 mg/dL (ref 6–23)
CO2: 29 mEq/L (ref 19–32)
CO2: 29 mEq/L (ref 19–32)
Calcium: 7.9 mg/dL — ABNORMAL LOW (ref 8.4–10.5)
Calcium: 8 mg/dL — ABNORMAL LOW (ref 8.4–10.5)
Creatinine, Ser: 0.53 mg/dL (ref 0.50–1.10)
Creatinine, Ser: 0.56 mg/dL (ref 0.50–1.10)
GFR calc Af Amer: >90 mL/min (ref >90)

## 2012-07-04 LAB — HEMOGLOBIN AND HEMATOCRIT, BLOOD
HCT: 27.8 % — ABNORMAL LOW (ref 36.0–46.0)
Hemoglobin: 6.7 g/dL — CL (ref 12.0–15.0)

## 2012-07-04 MED ORDER — ARTIFICIAL TEARS OP OINT
TOPICAL_OINTMENT | Freq: Every day | OPHTHALMIC | Status: DC
  Administered 2012-07-04 – 2012-07-06 (×3): via OPHTHALMIC
  Filled 2012-07-04: qty 3.5

## 2012-07-04 MED ORDER — POTASSIUM CHLORIDE CRYS ER 20 MEQ PO TBCR
40.0000 meq | EXTENDED_RELEASE_TABLET | Freq: Once | ORAL | Status: AC
  Administered 2012-07-04: 40 meq via ORAL
  Filled 2012-07-04: qty 1

## 2012-07-04 MED ORDER — MUPIROCIN 2 % EX OINT
TOPICAL_OINTMENT | Freq: Two times a day (BID) | CUTANEOUS | Status: DC
  Administered 2012-07-05 – 2012-07-07 (×6): via NASAL
  Filled 2012-07-04: qty 22

## 2012-07-04 MED ORDER — CHLORHEXIDINE GLUCONATE CLOTH 2 % EX PADS
6.0000 | MEDICATED_PAD | Freq: Every day | CUTANEOUS | Status: DC
  Administered 2012-07-05: 6 via TOPICAL

## 2012-07-04 MED ORDER — KCL IN DEXTROSE-NACL 20-5-0.9 MEQ/L-%-% IV SOLN
INTRAVENOUS | Status: AC
  Filled 2012-07-04: qty 1000

## 2012-07-04 MED ORDER — PANTOPRAZOLE SODIUM 40 MG IV SOLR
40.0000 mg | Freq: Once | INTRAVENOUS | Status: AC
  Administered 2012-07-04: 40 mg via INTRAVENOUS
  Filled 2012-07-04: qty 40

## 2012-07-04 MED ORDER — KCL IN DEXTROSE-NACL 20-5-0.9 MEQ/L-%-% IV SOLN
INTRAVENOUS | Status: DC
  Administered 2012-07-04: 17:00:00 via INTRAVENOUS
  Administered 2012-07-04: 125 mL via INTRAVENOUS
  Administered 2012-07-05 – 2012-07-06 (×5): via INTRAVENOUS
  Filled 2012-07-04 (×10): qty 1000

## 2012-07-04 NOTE — Progress Notes (Addendum)
Patient ID: [name], female   DOB: [birth year], 66 y.o.   MRN: [id number]  1 Day Post-Op Subjective: Patient with expected incisional pain although has used very little of PCA. No nausea/vomiting.  She has not required pressor support and BP has been stable overnight.  Arterial line has been poorly functional. BPs obtained from BP cuff. She does complain of "heartburn". She had another apparent sustained run of VT last night although was asymptomatic and there was some question of whether p waves were actually present raising doubt to whether this was actually VT.  She did have two apparent sustained runs of VT in the OR yesterday managed successfully with lidocaine and magnesium per anesthesia.   Objective: Vital signs in last 24 hours: Temp:  [96.8 F (36 C)-98.4 F (36.9 C)] 98.4 F (36.9 C) (08/30 0400) Pulse Rate:  [74-105] 85  (08/30 0600) Resp:  [10-26] 22  (08/30 0600) BP: (90-130)/(41-72) 96/43 mmHg (08/30 0600) SpO2:  [97 %-100 %] 99 % (08/30 0600) Arterial Line BP: (95-125)/(42-49) 97/47 mmHg (08/29 1739) Weight:  [58.4 kg (128 lb 12 oz)] 58.4 kg (128 lb 12 oz) (08/29 1800)  Intake/Output from previous day: 08/29 0701 - 08/30 0700 In: 7953 [I.V.:7703; IV Piggyback:250] Out: 3600 [Urine:3200; Blood:400] Intake/Output this shift:    Physical Exam:  General: Alert and oriented CV: RRR Lungs: Clear Abdomen: Soft, ND Incisions: Dressing dry and intact Ext: NT, No erythema, Minimal edema  Lab Results:  Basename 07/04/12 0450 07/03/12 1600  HGB 6.7* 7.8*  HCT 20.0* 22.7*   BMET  Basename 07/04/12 0450 07/03/12 1600  NA 133* 139  K 2.8* 3.7  CL 96 102  CO2 29 30  GLUCOSE 126* 114*  BUN 9 9  CREATININE 0.53 0.48*  CALCIUM 7.9* 8.0*     Studies/Results: Path pending.  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 BP,  however, is stable without vasopressor agents. Questionable recurrent ventricular tachycardia episodes.  Will replace potassium (Low-2.8).  Magnesium is 2.0 this morning. Will talk with Cardiology about medication recommendations considering concerns and recent changes in her medication regimen and removal of pheochromocytoma. Will continue monitoring in the ICU until at least later today.  2) FEN: Change IVF to D5 NS + KCL and replace potassium. Advance diet to clear.  3) Postop care: Ambulate, IS use. Continue DVT prophylaxis and IV pain control.     LOS: 1 day   Savon Cobbs,LES 07/04/2012, 7:31 AM

## 2012-07-04 NOTE — Clinical Documentation Improvement (Signed)
Anemia Blood Loss Clarification  THIS DOCUMENT IS NOT A PERMANENT PART OF THE MEDICAL RECORD  RESPOND TO THE THIS QUERY, FOLLOW THE INSTRUCTIONS BELOW:  1. If needed, update documentation for the patient's encounter via the notes activity.  2. Access this query again and click edit on the In [employer].  3. After updating, or not, click F2 to complete all highlighted (required) fields concerning your review. Select "additional documentation in the medical record" OR "no additional documentation provided".  4. Click Sign note button.  5. The deficiency will fall out of your In Basket *Please let us know if you are not able to complete this workflow by phone or e-mail (listed below).        07/04/12  Dear Dr.L [facility],  In an effort to better capture your patient's severity of illness, reflect appropriate length of stay and utilization of resources, a review of the patient medical record has revealed the following indicators.    Based on your clinical judgment, please clarify and document in a progress note and/or discharge summary the clinical condition associated with the following supporting information:  In responding to this query please exercise your independent judgment.  The fact that a query is asked, does not imply that any particular answer is desired or expected. 07/04/12 Progr note..."1) CV: Hemoglobin is low and she will receive 2 units of PRBCs today.".Marland KitchenMarland Kitchen For accurate Dx specificity & severity please help clarify/specify clinical cond being eval'd, mon'd & tx'd as noted in progr note. Thank you  Possible Clinical Conditions?  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  Serial H&H monitoring' 07/04/12 Repeat H&H, BMET  Medications (Fe, Procrit):   Reviewed: additional documentation in the MEDICAL RECORD [id number]:dc summ of 07/07/12 rev'd,md resp noted.ORM  Thank You,  Toribio Harbour, RN, BSN, CCDS Certified Clinical Documentation Specialist Pager: [phone]  Health Information Management [place]

## 2012-07-04 NOTE — Progress Notes (Signed)
CARE MANAGEMENT NOTE 07/04/2012  Patient:  JUDINE, ARCINIEGA   Account Number:  [id number]  Date Initiated:  07/04/2012  Documentation initiated by:  DAVIS,RHONDA  Subjective/Objective Assessment:   pt with phenocytoma of the adrenals, with partial adrenalectomy, hgb 7.7, run of v-tach, low k+     Action/Plan:   from home   Anticipated DC Date:  07/07/2012   Anticipated DC Plan:  HOME/SELF CARE  In-house referral  NA      DC Planning Services  NA      [facility] Choice  NA   Choice offered to / List presented to:  NA   DME arranged  NA      DME agency  NA     HH arranged  NA      HH agency  NA   Status of service:  In process, will continue to follow Medicare Important Message given?  NA - LOS <3 / Initial given by admissions (If response is "NO", the following Medicare IM given date fields will be blank) Date Medicare IM given:   Date Additional Medicare IM given:    Discharge Disposition:    Per UR Regulation:  Reviewed for med. necessity/level of care/duration of stay  If discussed at Long Length of Stay Meetings, dates discussed:    Comments:  [id number]/Rhonda Earlene Plater, RN, BSN, CCM: CHART REVIEWED AND UPDATED. NO DISCHARGE NEEDS PRESENT AT THIS TIME. CASE MANAGEMENT [phone]

## 2012-07-04 NOTE — Consult Note (Signed)
Reason for Consult: Wide complex tachycardia. Referring Physician: Dr. Laverle Patter Primary cardiologist: Dr. Antoine Poche PCP: Dr. Rudi Heap  [name] is an 66 y.o. female.  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  The patient has a past history of palpitations and a past history of mitral valve prolapse with mitral regurgitation.  She has a past history of paroxysmal atrial fibrillation and has been on long-term flecainide 50 mg twice a day.  This has been successful in keeping her out of atrial fibrillation.  The patient did not want to go on chronic long-term anticoagulation.  The patient does not have any history of ischemic heart disease.  She had a nuclear stress test on 04/28/11 which showed no evidence of ischemia and her ejection fraction was 63% with no wall motion abnormalities.  Her last echocardiogram was 03/28/10 and showed normal left ventricular systolic function with an ejection fraction of 60-65% with grade 1 diastolic dysfunction and mild mitral regurgitation.  Her pulmonary artery pressure was elevated at 49%. We are asked to see her postoperatively because of runs of wide-complex tachycardia occurring last night and this morning.  These arrhythmias occurred in the setting of marked anemia with hemoglobin of 6.7 and marked hypokalemia with a potassium of 2.8.  The patient denies any chest pain and she is not expressing any shortness of breath. The patient previously had been on chronic metoprolol.  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 Antoine Poche.  Postoperatively her blood pressure has been low but she has not required any IV pressors  .  Past Medical History  Diagnosis Date  . Palpitations JUNE 2012    piror to diagnosis of SVT  -NO PROBLMS WITH REOCCURANCE  . Mitral valve  prolapse     per pt report  . MR (mitral regurgitation)     per 2-d echocardiogram  . GERD (gastroesophageal reflux disease)   . Thyroid cancer     age 72; s/p resection with subsequent hypothyroidism. 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 Pheochromocytoma   . Blurry vision, right eye     HX OF RT EYE VISION DISTURBANCE 01/2012 -PT SEEN BY DR. RANKIN--ELEVATED B/P  THOUGHT TO BE RELATED TO [place] SPOTTED FEVER THOUGHT TO BE CAUSE OF VISION PROBLEM-HAD LASER OF BOTH EYES (SLIGHT DAMAGE TO LEFT  EYE).    . Atrial fibrillation     PT ON FLECANIDE -DID NOT WANT TO BE ON BLOOD THINNERS OTHER THAN DAILY ASA  . Hypertension     B/P HAS BEEN UP AND DOWN BUT MOSTLY DOWN WHILE ON METOPROLOL -WAS TAKEN OFF METOROLOL IN NOV 2012 BY CARDIOLOGIST AND THEN IN MARCH 2013 PT'S B/P BECAME REALLY ELEVATED-SHE RESTARTED METOPROLOL  AND WAS GIVEN BENICAR.  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 Hypothyroidism   . PONV (postoperative nausea and vomiting)     HX OF N&V AFTER SURGERIES-EXCEPT NO PROBLEM AFTER WRIST SURGERY IN 2012-AT [facility]  . Osteoarthritis     LITTLE FINGER LEFT HAND  . Headache     PAST HX MIGRAINES  . Vegetarian diet     DOES EAT EGGS AND DAIRY--NO MEATS OR FISH  . TMJ locking     PT STATES IF MOUTH OPEN EXTENDED TIME-LIKE AT DENTIST--HER JAWS LOCK  .  Cataract     BILATERAL  . Hearing loss of right ear     Past Surgical History  Procedure Date  . Thyroidectomy   . Lung biopsies   . Appendectomy   . Wrist surgery     History reviewed. No pertinent family history.  Social History:  reports that she has never smoked. She does not have any smokeless tobacco history on file. She reports that she does not drink alcohol or use illicit drugs.  Allergies:  Allergies  Allergen Reactions  . Penicillins Anaphylaxis  . Acetaminophen Nausea And Vomiting and Other (See Comments)    Headache  . Alendronate Sodium Nausea Only  and Other (See Comments)    Heartburn, stomach upset  . Celecoxib Other (See Comments)    Stomach upset, heart burn  . Codeine Nausea And Vomiting and Other (See Comments)    Headache  . Ventolin (ZOX:[id number]) Palpitations    Rapid heart beat    Medications:  Scheduled:   . artificial tears   Both Eyes QHS  . dextrose 5 % and 0.9 % NaCl with KCl 20 mEq/L      . docusate sodium  100 mg Oral BID  . flecainide  50 mg Oral BID  . HYDROmorphone      . HYDROmorphone      . HYDROmorphone PCA 0.3 mg/mL   Intravenous Q4H  . HYDROmorphone PCA 0.3 mg/mL      . levothyroxine  137 mcg Oral QAC breakfast  . pantoprazole (PROTONIX) IV  40 mg Intravenous Once  . polyvinyl alcohol  1 drop Both Eyes BID  . potassium chloride  40 mEq Oral Once  . vancomycin  1,000 mg Intravenous Q12H  . DISCONTD: acetaminophen  1,000 mg Intravenous Q6H  . DISCONTD: carboxymethylcellulose  1 drop Both Eyes BID  . DISCONTD: esmolol  25-300 mcg/kg/min Intravenous To OR  . DISCONTD: magnesium sulfate  2 g Intravenous To OR  . DISCONTD: magnesium sulfate 1 - 4 g bolus IVPB  2 g Intravenous To OR  . DISCONTD: nitroPRUSSide  0.25-0.5 mcg/kg/min Intravenous To OR  . DISCONTD: REFRESH LACRI-LUBE  1 inch Ophthalmic QHS  . DISCONTD: vancomycin  1,000 mg Intravenous 60 min Pre-Op    Results for orders placed during the hospital encounter of 07/03/12 (from the past 48 hour(s))  ABO/RH     Status: Normal   Collection Time   07/03/12  8:00 AM      Component Value Range Comment   ABO/RH(D) O NEG     TYPE AND SCREEN     Status: Normal (Preliminary result)   Collection Time   07/03/12  8:10 AM      Component Value Range Comment   ABO/RH(D) O NEG      Antibody Screen NEG      Sample Expiration 07/06/2012      Unit Number [id number]      Blood Component Type RBC LR PHER2      Unit division 00      Status of Unit ISSUED      Transfusion Status OK TO TRANSFUSE      Crossmatch Result Compatible      Unit Number  [id number]      Blood Component Type RED CELLS,LR      Unit division 00      Status of Unit ISSUED      Transfusion Status OK TO TRANSFUSE      Crossmatch Result Compatible     BLOOD GAS, ARTERIAL  Status: Abnormal   Collection Time   07/03/12  1:12 PM      Component Value Range Comment   FIO2 1.00      pH, Arterial 7.481 (*) 7.350 - 7.450    pCO2 arterial 35.4  35.0 - 45.0 mmHg    pO2, Arterial 386.0 (*) 80.0 - 100.0 mmHg    Bicarbonate 26.2 (*) 20.0 - 24.0 mEq/L    TCO2 24.5  0 - 100 mmol/L    Acid-Base Excess 3.0 (*) 0.0 - 2.0 mmol/L    O2 Saturation 99.9      Patient temperature 98.6      Collection site COLLECTED BY OR      Drawn by COLLECTED BY OR      Sample type ARTERIAL DRAW     GLUCOSE, CAPILLARY     Status: Abnormal   Collection Time   07/03/12  1:55 PM      Component Value Range Comment   Glucose-Capillary 136 (*) 70 - 99 mg/dL   GLUCOSE, CAPILLARY     Status: Abnormal   Collection Time   07/03/12  3:39 PM      Component Value Range Comment   Glucose-Capillary 112 (*) 70 - 99 mg/dL    Comment 1 Documented in Chart      Comment 2 Notify RN     BASIC METABOLIC PANEL     Status: Abnormal   Collection Time   07/03/12  4:00 PM      Component Value Range Comment   Sodium 139  135 - 145 mEq/L    Potassium 3.7  3.5 - 5.1 mEq/L SLIGHT HEMOLYSIS   Chloride 102  96 - 112 mEq/L    CO2 30  19 - 32 mEq/L    Glucose, Bld 114 (*) 70 - 99 mg/dL    BUN 9  6 - 23 mg/dL    Creatinine, Ser 1.61 (*) 0.50 - 1.10 mg/dL    Calcium 8.0 (*) 8.4 - 10.5 mg/dL    GFR calc non Af Amer >90  >90 mL/min    GFR calc Af Amer >90  >90 mL/min   HEMOGLOBIN AND HEMATOCRIT, BLOOD     Status: Abnormal   Collection Time   07/03/12  4:00 PM      Component Value Range Comment   Hemoglobin 7.8 (*) 12.0 - 15.0 g/dL    HCT 09.6 (*) 04.5 - 46.0 %   BASIC METABOLIC PANEL     Status: Abnormal   Collection Time   07/04/12  4:50 AM      Component Value Range Comment   Sodium 133 (*) 135 - 145 mEq/L     Potassium 2.8 (*) 3.5 - 5.1 mEq/L    Chloride 96  96 - 112 mEq/L    CO2 29  19 - 32 mEq/L    Glucose, Bld 126 (*) 70 - 99 mg/dL    BUN 9  6 - 23 mg/dL    Creatinine, Ser 4.09  0.50 - 1.10 mg/dL    Calcium 7.9 (*) 8.4 - 10.5 mg/dL    GFR calc non Af Amer >90  >90 mL/min    GFR calc Af Amer >90  >90 mL/min   HEMOGLOBIN AND HEMATOCRIT, BLOOD     Status: Abnormal   Collection Time   07/04/12  4:50 AM      Component Value Range Comment   Hemoglobin 6.7 (*) 12.0 - 15.0 g/dL    HCT 81.1 (*) 91.4 - 46.0 %  REPEATED TO VERIFY  MAGNESIUM     Status: Normal   Collection Time   07/04/12  4:50 AM      Component Value Range Comment   Magnesium 2.0  1.5 - 2.5 mg/dL     No results found.  Review of systems: The patient gives a past history of tolerating beta blockers poorly.  They made her feel extremely weak and it caused her to experience the smell of smoke. Blood pressure 100/47, pulse 85, temperature 97.8 F (36.6 C), temperature source Oral, resp. rate 25, height 5\' 6"  (1.676 m), weight 128 lb 12 oz (58.4 kg), SpO2 98.00%. The patient appears to be in no distress.  The patient is pale.  Skin is warm and dry  Head and neck exam reveals that the pupils are equal and reactive.  The extraocular movements are full.  There is no scleral icterus.  Mouth and pharynx are benign.  No lymphadenopathy.  No carotid bruits.  The jugular venous pressure is normal.  Thyroid is not enlarged or tender.  Chest is clear to percussion and auscultation.  No rales or rhonchi.  Expansion of the chest is symmetrical.  Heart reveals no abnormal lift or heave.  First and second heart sounds are normal.  There is no murmur gallop rub or click.  The abdomen is soft .  Bowel sounds are absent.  There is an incision in the right upper quadrant. There is no hepatosplenomegaly or mass.  There are no abdominal bruits.  Extremities reveal no phlebitis or edema.  Pedal pulses are good.  There is no cyanosis or  clubbing.  Neurologic exam is normal strength and no lateralizing weakness.  No sensory deficits.  Integument reveals no rash  EKG done today shows normal sinus rhythm with left ventricular hypertrophy.  She has flat T waves and a prolonged QT interval consistent with her marked hypokalemia  Assessment/Plan: 1.  Postoperative wide complex tachycardia consistent with ventricular tachycardia occurring in the setting of severe anemia and severe hypokalemia. 2.  past history of paroxysmal atrial fibrillation, on long-term low-dose flecainide. 3.  mild mitral regurgitation by history and by prior echocardiogram. 4.  asymptomatic hypotension following successful right adrenalectomy for pheochromocytoma. 5.  Hypothyroidism  Recommendation: Continue present dose of flecainide.  Her arrhythmias will be expected to improve with correction of her hypokalemia and correction of her severe postoperative anemia.  Continue aggressive repletion of potassium and would transfuse up to a hemoglobin of 10. Will recheck EKG in am. Will plan to consider update of  2D echo after her hemoglobin has been repleted. We'll follow with you.  Cassell Clement 07/04/2012, 11:54 AM

## 2012-07-04 NOTE — Progress Notes (Signed)
Patient ID: [name], female   DOB: [birth year], 66 y.o.   MRN: [id number]   Pt doing well today.  No further tachycardic episodes.  BP still stable without need for pressor support.   Cardiology note reviewed.  Appreciate recommendations and assistance with management.    Hgb 9.5 after 2 units transfusion. Will use PRBCs for further resuscitation as needed with goal Hgb of 10.0 per cardiology.  BMP pending.  Will await results and continue to replace electrolytes as needed.  Pathology pending.  [name] has been stable today.  I think she can be transferred to a telemetry bed at this time.

## 2012-07-04 NOTE — Progress Notes (Signed)
CRITICAL VALUE ALERT  Critical value received:  HgB = 6.7  Date of notification:  07/04/12  Time of notification:  05:32  Critical value read back: Yes  Nurse who received alert:  Guinevere Scarlet, RN  MD notified (1st page):  Pecola Leisure, PA  Time of first page:  05:38   MD notified (2nd page): NA  Time of second page: NA  Responding MD:  NA  Time MD responded:  NA

## 2012-07-05 LAB — BASIC METABOLIC PANEL
BUN: 7 mg/dL (ref 6–23)
Calcium: 8.2 mg/dL — ABNORMAL LOW (ref 8.4–10.5)
GFR calc non Af Amer: >90 mL/min (ref >90)
Glucose, Bld: 126 mg/dL — ABNORMAL HIGH (ref 70–99)
Sodium: 134 mEq/L — ABNORMAL LOW (ref 135–145)

## 2012-07-05 LAB — TYPE AND SCREEN
ABO/RH(D): O NEG
Unit division: 0

## 2012-07-05 LAB — HEMOGLOBIN AND HEMATOCRIT, BLOOD: HCT: 27.9 % — ABNORMAL LOW (ref 36.0–46.0)

## 2012-07-05 MED ORDER — POTASSIUM CHLORIDE CRYS ER 20 MEQ PO TBCR
40.0000 meq | EXTENDED_RELEASE_TABLET | Freq: Once | ORAL | Status: AC
  Administered 2012-07-05: 40 meq via ORAL
  Filled 2012-07-05: qty 2

## 2012-07-05 NOTE — Progress Notes (Signed)
2 Days Post-Op  Subjective: 1 - Rt Adrenal Mass - POD 3 for open rt adrenalectomy for mass (?pheo). Out of ICU overnight to floor. Out of chair as well. Pain controlled. +flatus, hungry. No n/v or emesis. No more events of SVT.  Objective: Vital signs in last 24 hours: Temp:  [97.5 F (36.4 C)-99.4 F (37.4 C)] 99.4 F (37.4 C) (08/31 0536) Pulse Rate:  [80-110] 108  (08/31 0536) Resp:  [17-26] 21  (08/31 0536) BP: (91-109)/(39-61) 109/58 mmHg (08/31 0536) SpO2:  [93 %-100 %] 97 % (08/31 0536) Last BM Date: 07/02/12  Intake/Output from previous day: 08/30 0701 - 08/31 0700 In: 4251.5 [P.O.:360; I.V.:3191.5; Blood:700] Out: 1140 [Urine:1140] Intake/Output this shift: Total I/O In: 1375 [I.V.:1375] Out: 550 [Urine:550]  General appearance: alert, cooperative and appears stated age Head: Normocephalic, without obvious abnormality, atraumatic Eyes: conjunctivae/corneas clear. PERRL, EOM's intact. Fundi benign. Resp: clear to auscultation bilaterally Cardio: regular rate and rhythm, S1, S2 normal, no murmur, click, rub or gallop GI: soft, non-tender; bowel sounds normal; no masses,  no organomegaly Extremities: extremities normal, atraumatic, no cyanosis or edema Incision/Wound: Rt subcostal incision c/d/i.   Lab Results:   Basename 07/05/12 0432 07/04/12 1645  WBC -- --  HGB 9.5* 9.5*  HCT 27.9* 27.8*  PLT -- --   BMET  Basename 07/05/12 0432 07/04/12 1645  NA 134* 133*  K 3.3* 3.7  CL 101 98  CO2 28 29  GLUCOSE 126* 112*  BUN 7 9  CREATININE 0.49* 0.56  CALCIUM 8.2* 8.0*   PT/INR No results found for this basename: LABPROT:2,INR:2 in the last 72 hours ABG  Basename 07/03/12 1312  PHART 7.481*  HCO3 26.2*    Studies/Results: No results found.  Anti-infectives: Anti-infectives     Start     Dose/Rate Route Frequency Ordered Stop   07/03/12 2330   vancomycin (VANCOCIN) IVPB 1000 mg/200 mL premix        1,000 mg 200 mL/hr over 60 Minutes Intravenous  Every 12 hours 07/03/12 1434 07/03/12 2340   07/03/12 0738   vancomycin (VANCOCIN) IVPB 1000 mg/200 mL premix  Status:  Discontinued        1,000 mg 200 mL/hr over 60 Minutes Intravenous 60 min pre-op 07/03/12 0738 07/03/12 1434          Assessment/Plan:  1 - s/p Rt Adrenalectomy - doing well.  Reg diet, ambulate, DC foley. Hgb stable, but continue daily labs. Replace K. Outlined remaining goals of admission with DC after maintaining nutrition, pain controlled on PO meds, ambulatory.    LOS: 2 days    [facility], [name] 07/05/2012

## 2012-07-05 NOTE — Progress Notes (Signed)
Patient Name: [name]      SUBJECTIVE: admitted follwing adrenaectomy for suspected pheo with history of atrial fibrillation in the setting of mitral valve prolapse. She's been made on flecainide twice daily. She has a CHADS-VASc score 3 has chosen no anticoagulation.  Wide-complex tachycardia was noted yesterday in the setting of hypokalemia and anemia.  Without sob this anm  Past Medical History  Diagnosis Date  . Palpitations JUNE 2012    piror to diagnosis of SVT  -NO PROBLMS WITH REOCCURANCE  . Mitral valve prolapse     per pt report  . MR (mitral regurgitation)     per 2-d echocardiogram  . GERD (gastroesophageal reflux disease)   . Thyroid cancer     age 66; s/p resection with subsequent hypothyroidism. 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 Pheochromocytoma   . Blurry vision, right eye     HX OF RT EYE VISION DISTURBANCE 01/2012 -PT SEEN BY DR. RANKIN--ELEVATED B/P  THOUGHT TO BE RELATED TO [place] SPOTTED FEVER THOUGHT TO BE CAUSE OF VISION PROBLEM-HAD LASER OF BOTH EYES (SLIGHT DAMAGE TO LEFT  EYE).    . Atrial fibrillation     PT ON FLECANIDE -DID NOT WANT TO BE ON BLOOD THINNERS OTHER THAN DAILY ASA  . Hypertension     B/P HAS BEEN UP AND DOWN BUT MOSTLY DOWN WHILE ON METOPROLOL -WAS TAKEN OFF METOROLOL IN NOV 2012 BY CARDIOLOGIST AND THEN IN MARCH 2013 PT'S B/P BECAME REALLY ELEVATED-SHE RESTARTED METOPROLOL  AND WAS GIVEN BENICAR.  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 Hypothyroidism   . PONV (postoperative nausea and vomiting)     HX OF N&V AFTER SURGERIES-EXCEPT NO PROBLEM AFTER WRIST SURGERY IN 2012-AT [facility]  . Osteoarthritis     LITTLE FINGER LEFT HAND  . Headache     PAST HX MIGRAINES  . Vegetarian diet     DOES EAT EGGS AND DAIRY--NO MEATS OR FISH  . TMJ locking     PT STATES IF MOUTH OPEN EXTENDED TIME-LIKE AT DENTIST--HER JAWS LOCK  . Cataract     BILATERAL  . Hearing loss of  right ear     PHYSICAL EXAM Filed Vitals:   07/04/12 2301 07/04/12 2320 07/05/12 0408 07/05/12 0536  BP: 102/61   109/58  Pulse: 96   108  Temp: 98.3 F (36.8 C)   99.4 F (37.4 C)  TempSrc: Oral   Oral  Resp: 20 21 17 21   Height:      Weight:      SpO2: 98% 100% 97% 97%    Well developed and nourished in no acute distress wearing oxygen HENT normal Neck supple with JVP-flat Clear Regular butregular  rate and rhythm, no murmurs or gallops Abd-soft with active BS No Clubbing cyanosis edema +SCD Skin-warm and dry A & Oriented  Grossly normal sensory and motor function   TELEMETRY: Reviewed telemetry pt in  sinustachy with wide complex beats prob NOT VT but rather rate related aberration assoc with PACs :    Intake/Output Summary (Last 24 hours) at 07/05/12 0817 Last data filed at 07/05/12 0643  Gross per 24 hour  Intake 4250.84 ml  Output   1265 ml  Net 2985.84 ml    LABS: Basic Metabolic Panel:  Lab 07/05/12 4098 07/04/12 1645 07/04/12 0450 07/03/12 1600  NA 134* 133* 133* 139  K 3.3* 3.7 2.8* 3.7  CL  101 98 96 102  CO2 28 29 29 30   GLUCOSE 126* 112* 126* 114*  BUN 7 9 9 9   CREATININE 0.49* 0.56 0.53 0.48*  CALCIUM 8.2* 8.0* -- --  MG 1.7 -- 2.0 --  PHOS -- -- -- --   Cardiac Enzymes: No results found for this basename: CKTOTAL:3,CKMB:3,CKMBINDEX:3,TROPONINI:3 in the last 72 hours CBC:  Lab 07/05/12 0432 07/04/12 1645 07/04/12 0450 07/03/12 1600  WBC -- -- -- --  NEUTROABS -- -- -- --  HGB 9.5* 9.5* 6.7* 7.8*  HCT 27.9* 27.8* 20.0* 22.7*  MCV -- -- -- --  PLT -- -- -- --   Echo P  ASSESSMENT AND PLAN:  Sinus tach with broad complex beats, prob aberration Continue K and HGB repletion   Signed, Sherryl Manges MD  07/05/2012

## 2012-07-06 DIAGNOSIS — R000 Tachycardia, unspecified: Secondary | ICD-10-CM

## 2012-07-06 DIAGNOSIS — E876 Hypokalemia: Secondary | ICD-10-CM

## 2012-07-06 LAB — BASIC METABOLIC PANEL
CO2: 30 mEq/L (ref 19–32)
Calcium: 8.3 mg/dL — ABNORMAL LOW (ref 8.4–10.5)
GFR calc Af Amer: >90 mL/min (ref >90)
GFR calc non Af Amer: >90 mL/min (ref >90)
Sodium: 136 mEq/L (ref 135–145)

## 2012-07-06 LAB — HEMOGLOBIN AND HEMATOCRIT, BLOOD
HCT: 27.9 % — ABNORMAL LOW (ref 36.0–46.0)
Hemoglobin: 9.4 g/dL — ABNORMAL LOW (ref 12.0–15.0)

## 2012-07-06 MED ORDER — TRAMADOL HCL 50 MG PO TABS: 100.0000 mg | ORAL_TABLET | Freq: Four times a day (QID) | ORAL | Status: DC | PRN

## 2012-07-06 MED ORDER — POTASSIUM CHLORIDE CRYS ER 20 MEQ PO TBCR
40.0000 meq | EXTENDED_RELEASE_TABLET | Freq: Once | ORAL | Status: AC
  Administered 2012-07-06: 40 meq via ORAL
  Filled 2012-07-06: qty 2

## 2012-07-06 MED ORDER — BISACODYL 10 MG RE SUPP
10.0000 mg | Freq: Once | RECTAL | Status: AC
  Administered 2012-07-06: 10 mg via RECTAL
  Filled 2012-07-06: qty 1

## 2012-07-06 MED ORDER — KETOROLAC TROMETHAMINE 15 MG/ML IJ SOLN
15.0000 mg | Freq: Four times a day (QID) | INTRAMUSCULAR | Status: DC
  Administered 2012-07-06 (×2): 15 mg via INTRAVENOUS
  Filled 2012-07-06 (×5): qty 1

## 2012-07-06 MED ORDER — MAGNESIUM OXIDE 400 (241.3 MG) MG PO TABS
400.0000 mg | ORAL_TABLET | Freq: Once | ORAL | Status: AC
  Administered 2012-07-06: 400 mg via ORAL
  Filled 2012-07-06: qty 1

## 2012-07-06 NOTE — Progress Notes (Signed)
Patient Name: [name]      SUBJECTIVE: admitted follwing adrenaectomy for suspected pheo with history of atrial fibrillation in the setting of mitral valve prolapse. She's been made on flecainide twice daily. She has a CHADS-VASc score 3 has chosen no anticoagulation.  Wide-complex tachycardia was noted Friday  in the setting of hypokalemia and anemia.initially thought VT  But prob rate related aberration No cp or sob  Past Medical History  Diagnosis Date  . Palpitations JUNE 2012    piror to diagnosis of SVT  -NO PROBLMS WITH REOCCURANCE  . Mitral valve prolapse     per pt report  . MR (mitral regurgitation)     per 2-d echocardiogram  . GERD (gastroesophageal reflux disease)   . Thyroid cancer     age 66; s/p resection with subsequent hypothyroidism. 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 Pheochromocytoma   . Blurry vision, right eye     HX OF RT EYE VISION DISTURBANCE 01/2012 -PT SEEN BY DR. RANKIN--ELEVATED B/P  THOUGHT TO BE RELATED TO [place] SPOTTED FEVER THOUGHT TO BE CAUSE OF VISION PROBLEM-HAD LASER OF BOTH EYES (SLIGHT DAMAGE TO LEFT  EYE).    . Atrial fibrillation     PT ON FLECANIDE -DID NOT WANT TO BE ON BLOOD THINNERS OTHER THAN DAILY ASA  . Hypertension     B/P HAS BEEN UP AND DOWN BUT MOSTLY DOWN WHILE ON METOPROLOL -WAS TAKEN OFF METOROLOL IN NOV 2012 BY CARDIOLOGIST AND THEN IN MARCH 2013 PT'S B/P BECAME REALLY ELEVATED-SHE RESTARTED METOPROLOL  AND WAS GIVEN BENICAR.  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 Hypothyroidism   . PONV (postoperative nausea and vomiting)     HX OF N&V AFTER SURGERIES-EXCEPT NO PROBLEM AFTER WRIST SURGERY IN 2012-AT [facility]  . Osteoarthritis     LITTLE FINGER LEFT HAND  . Headache     PAST HX MIGRAINES  . Vegetarian diet     DOES EAT EGGS AND DAIRY--NO MEATS OR FISH  . TMJ locking     PT STATES IF MOUTH OPEN EXTENDED TIME-LIKE AT DENTIST--HER JAWS LOCK  .  Cataract     BILATERAL  . Hearing loss of right ear     PHYSICAL EXAM Filed Vitals:   07/06/12 0416 07/06/12 0529 07/06/12 0547 07/06/12 0800  BP:  130/72 134/73   Pulse:  106 106   Temp:  98.3 F (36.8 C) 98.9 F (37.2 C)   TempSrc:  Oral Oral   Resp: 20 30 25 27   Height:      Weight:      SpO2: 97% 97% 96% 96%    Well developed and nourished in no acute distress HENT normal Neck supple with JVP-flat Clear Regular burt rapid rate and rhythm, no murmurs or gallops Abd-soft with active BS No Clubbing cyanosis edema Skin-warm and dry A & Oriented  Grossly normal sensory and motor function  TELEMETRY: Reviewed telemetry pt in sinsu tache with rate related aberration    Intake/Output Summary (Last 24 hours) at 07/06/12 0931 Last data filed at 07/06/12 0640  Gross per 24 hour  Intake 3513.9 ml  Output   1750 ml  Net 1763.9 ml    LABS: Basic Metabolic Panel:  Lab 07/06/12 4098 07/05/12 0432 07/04/12 1645 07/04/12 0450 07/03/12 1600  NA 136 134* 133* 133* 139  K 3.5 3.3* 3.7 2.8* 3.7  CL 102 101 98 96  102  CO2 30 28 29 29 30   GLUCOSE 114* 126* 112* 126* 114*  BUN 5* 7 9 9 9   CREATININE 0.47* 0.49* 0.56 0.53 0.48*  CALCIUM 8.3* 8.2* -- -- --  MG 1.7 1.7 -- -- --  PHOS -- -- -- -- --   Cardiac Enzymes: No results found for this basename: CKTOTAL:3,CKMB:3,CKMBINDEX:3,TROPONINI:3 in the last 72 hours CBC:  Lab 07/06/12 0534 07/05/12 0432 07/04/12 1645 07/04/12 0450 07/03/12 1600  WBC -- -- -- -- --  NEUTROABS -- -- -- -- --  HGB 9.4* 9.5* 9.5* 6.7* 7.8*  HCT 27.9* 27.9* 27.8* 20.0* 22.7*  MCV -- -- -- -- --  PLT -- -- -- -- --     ASSESSMENT AND PLAN:  Patient Active Hospital Problem List: Atrial fibrillation (04/04/2010)-quiet   Sinus tachycardia (07/06/2012)  persistent  Hypokalemia (07/06/2012)  better  Will sign off call for questions    Signed, Sherryl Manges MD  07/06/2012

## 2012-07-06 NOTE — Progress Notes (Signed)
Patient ID: [name], female   DOB: [birth year], 66 y.o.   MRN: [id number]  3 Days Post-Op Subjective: Pt doing relatively well since transfer to the floor.  She has begun ambulating but only in her room. She is tolerating regular diet but has not yet passed flatus or had BM. No nausea or vomiting.  BP remains very stable off antihypertensives. Have been replacing electrolytes.  She has had two short asymptomatic runs of VT this morning. Complains of pain in right hip and back which is chronic but worse since surgery.  Objective: Vital signs in last 24 hours: Temp:  [98.3 F (36.8 C)-99.6 F (37.6 C)] 98.9 F (37.2 C) (09/01 0547) Pulse Rate:  [106-116] 106  (09/01 0547) Resp:  [16-30] 27  (09/01 0800) BP: (110-134)/(64-73) 134/73 mmHg (09/01 0547) SpO2:  [95 %-98 %] 96 % (09/01 0800)  Intake/Output from previous day: 08/31 0701 - 09/01 0700 In: 3753.9 [P.O.:600; I.V.:3153.9] Out: 1750 [Urine:1750] Intake/Output this shift:    Physical Exam:  General: Alert and oriented CV: Regular, slight tachycardia Lungs: Clear Abdomen: Soft, mild distention, positive BS Incisions: C/D/I Ext: NT, No erythema  Lab Results:  Basename 07/06/12 0534 07/05/12 0432 07/04/12 1645  HGB 9.4* 9.5* 9.5*  HCT 27.9* 27.9* 27.8*   BMET  Basename 07/06/12 0534 07/05/12 0432  NA 136 134*  K 3.5 3.3*  CL 102 101  CO2 30 28  GLUCOSE 114* 126*  BUN 5* 7  CREATININE 0.47* 0.49*  CALCIUM 8.3* 8.2*   Mg 1.7  Studies/Results: Pathology: Pending  Assessment/Plan: 1) CV: Hemodynamically stable. Hgb has been stable since transfusion. Will not transfuse at this time unless Cardiology feels it is important to have Hgb above 10.0.  Replacing electrolytes. Cardiology following and their advice is appreciated. She remains on flecainide. Will rely on Cardiology for advice on any medication changes prior to discharge.  Currently, not requiring calcium channel blocker/beta blocker for BP control.   Tachycardia appears to be sinus and likely related at least in part to pain.   2) F/E/N: Tolerating regular diet. Dulcolax suppository today. Replace potassium and magnesium.  3) Post-op care: Encouraged ambulation and IS use. DVT prophylaxis. Wean oxygen. Transition to po pain meds and toradol.  Will stop PCA.  4) Adrenal mass: Await pathology.    LOS: 3 days   Julen Rubert,LES 07/06/2012, 8:44 AM

## 2012-07-06 NOTE — Progress Notes (Signed)
Pt had an 8 bt run of V-tach.  Upon assessment pt was asymptomatic.  Pt was getting up to go to the bathroom when the V-tach occurred.  Vitals obtained.  On-call attending notified.  Will continue to monitor.

## 2012-07-06 NOTE — Progress Notes (Signed)
Pt had 13 beat run of v tach, was lying quietly in bed. On call attending notified.

## 2012-07-07 LAB — BASIC METABOLIC PANEL
BUN: 7 mg/dL (ref 6–23)
Calcium: 8.5 mg/dL (ref 8.4–10.5)
Creatinine, Ser: 0.47 mg/dL — ABNORMAL LOW (ref 0.50–1.10)
GFR calc Af Amer: >90 mL/min (ref >90)
GFR calc non Af Amer: >90 mL/min (ref >90)
Glucose, Bld: 105 mg/dL — ABNORMAL HIGH (ref 70–99)

## 2012-07-07 MED ORDER — DOCUSATE SODIUM 100 MG PO CAPS: 100.0000 mg | ORAL_CAPSULE | Freq: Two times a day (BID) | ORAL | Status: AC

## 2012-07-07 MED ORDER — TRAMADOL HCL 50 MG PO TABS: 100.0000 mg | ORAL_TABLET | Freq: Four times a day (QID) | ORAL | Status: AC | PRN

## 2012-07-07 NOTE — Discharge Summary (Signed)
Physician Discharge Summary  Patient ID: [name] MRN: [id number] DOB/AGE: 1946-04-17 66 y.o.  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  She underwent necessary preoperative medical therapy and her beta blocker had been discontinued preoperatively.  She was admitted in the morning and underwent aggressive IV fluid hydration and then was taken to the OR and underwent a right open adrenalectomy. 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  She did have two episodes of wide complex tachycardia intraoperatively managed with lidocaine and magnesium with subsequent resolution and no hemodynamic instability otherwise noted. Postoperatively, she was transferred to the ICU for close hemodynamic monitoring.  She remained stable without the need for vasopressor agents.  She again had an episode of wide complex tachycardia which was asymptomatic.  A cardiology consultation was obtained.  It was felt that the most likely factor was hypokalemia and acute blood loss anemia.  Her electrolytes were replenished and she received 2 units of PRBCs transfused with stabilization of her hemoglobin.  She was transferred to a telemetry bed and remained hemodynamically stable throughout her hospital course without the need for additional medical therapy for her blood pressure which remained quite stable.  She continued to have sinus tachycardia (HR 100-110). Cardiology  continued to follow her but did not have further recommendations regarding her medications except to continue her flecainide. By postoperative day # 4, she had return of bowel function and was ambulating well.  Her pain was well controlled. She was felt stable for discharge.  Her pathology report remains pending at the time of discharge.  Consults: cardiology  Disposition: 01-Home or Self Care  Discharge Orders    Future Appointments: Provider: Department: Dept Phone: Center:   07/30/2012 11:00 AM Rollene Rotunda, MD [facility] [phone] [facility]     Medication List  As of 07/07/2012  8:56 AM   STOP taking these medications         azithromycin 250 MG tablet         TAKE these medications         aspirin 325 MG EC tablet   Take 325 mg by mouth daily.      carboxymethylcellulose 0.5 % Soln   Commonly known as: REFRESH PLUS   Place 1 drop into both eyes 2 (two) times daily.      CENTRUM SILVER tablet   Take 1 tablet by mouth every morning.      PRESERVISION/LUTEIN PO   Take 1 capsule by mouth daily.      docusate sodium 100 MG capsule   Commonly known as: COLACE   Take 1 capsule (100 mg total) by mouth 2 (two) times daily.      flecainide 50 MG tablet   Commonly known as: TAMBOCOR   Take 1 tablet (50 mg total) by mouth 2 (two) times daily.      levothyroxine 137 MCG tablet   Commonly known as: SYNTHROID, LEVOTHROID   Take 137 mcg by mouth every morning.      loratadine 10 MG tablet   Commonly known as: CLARITIN  Take 10 mg by mouth daily as needed.      NIFEdipine 30 MG 24 hr tablet   Commonly known as: PROCARDIA-XL/ADALAT CC   Take 30 mg by mouth 2 (two) times daily with a meal.      REFRESH LACRI-LUBE Oint   Apply 1 inch to eye at bedtime. Apply to bottom lid      traMADol 50 MG tablet   Commonly known as: ULTRAM   Take 2 tablets (100 mg total) by mouth every 6 (six) hours as needed.      Vitamin D3 2000 UNITS Tabs   Take 1 capsule by mouth  daily.           Follow-up Information    Follow up with Crecencio Mc, MD on 07/15/2012. (at 10:30)    Contact information:   [address] [place], [address] [facility] [place] [address] [phone]          Signed: Crecencio Mc 07/07/2012, 8:56 AM

## 2012-07-07 NOTE — Progress Notes (Signed)
Patient ID: [name], female   DOB: 03/26/1946, 66 y.o.   MRN: [id number]  4 Days Post-Op Subjective: Pt feeling improved. Pain is well controlled with minimal medication.  She has been ambulating without difficulty and tolerating a regular diet.  She had had flatus and a bowel movement. No episodes of arrythmia in last 24 hrs.  Cardiology has signed off without recommendations for changes in medications right now. BP has been very stable.  Objective: Vital signs in last 24 hours: Temp:  [98.3 F (36.8 C)-98.9 F (37.2 C)] 98.9 F (37.2 C) (09/02 0544) Pulse Rate:  [101-108] 108  (09/02 0544) Resp:  [19-22] 19  (09/02 0544) BP: (122-129)/(70-73) 122/73 mmHg (09/02 0544) SpO2:  [97 %-99 %] 97 % (09/02 0544)  Intake/Output from previous day: 09/01 0701 - 09/02 0700 In: 2661.7 [P.O.:720; I.V.:1941.7] Out: 700 [Urine:700] Intake/Output this shift:    Physical Exam:  General: Alert and oriented CV: Regular, slightly tachycardic Lungs: Clear Abdomen: Soft, ND, positive BS Incisions: C/D/I Ext: NT, No erythema, no edema  Lab Results:  Basename 07/06/12 0534 07/05/12 0432 07/04/12 1645  HGB 9.4* 9.5* 9.5*  HCT 27.9* 27.9* 27.8*   BMET  Basename 07/07/12 0421 07/06/12 0534  NA 138 136  K 3.8 3.5  CL 106 102  CO2 29 30  GLUCOSE 105* 114*  BUN 7 5*  CREATININE 0.47* 0.47*  CALCIUM 8.5 8.3*     Studies/Results: Path pending.  Assessment/Plan: 1) CV: No further recommendations per cardiology. Continue flecainide upon discharge.  Followup with Dr. Antoine Poche as scheduled or sooner if palpitations or chest pain. She does have slight sinus tachycardic without a history of significant CAD.  I have recommended patient monitor her HR and BP at home.    2) Right adrenal mass: Pathology pending.  Will call patient with results once final.  3) Disp: Will discharge home.   LOS: 4 days   [name],LES 07/07/2012, 8:49 AM

## 2012-07-24 ENCOUNTER — Telehealth: Payer: Self-pay | Admitting: Cardiology

## 2012-07-24 NOTE — Telephone Encounter (Signed)
Will have Dr Antoine Poche to review to see if pt even needs pre-med before dental work

## 2012-07-24 NOTE — Telephone Encounter (Signed)
New problem:   Please advise on pre - med -  Her history is stating she can take azithromycin . Dental Md is stating this is not the best method.  appt on 9/24.

## 2012-07-25 NOTE — Telephone Encounter (Signed)
faxed

## 2012-07-25 NOTE — Telephone Encounter (Signed)
She does not need SBP prophylaxis.

## 2012-07-30 ENCOUNTER — Ambulatory Visit (INDEPENDENT_AMBULATORY_CARE_PROVIDER_SITE_OTHER): Payer: Medicare Other | Admitting: Cardiology

## 2012-07-30 ENCOUNTER — Encounter: Payer: Self-pay | Admitting: Cardiology

## 2012-07-30 VITALS — BP 120/70 | HR 93 | Ht 66.0 in | Wt 135.0 lb

## 2012-07-30 DIAGNOSIS — I059 Rheumatic mitral valve disease, unspecified: Secondary | ICD-10-CM

## 2012-07-30 DIAGNOSIS — D3500 Benign neoplasm of unspecified adrenal gland: Secondary | ICD-10-CM

## 2012-07-30 DIAGNOSIS — I10 Essential (primary) hypertension: Secondary | ICD-10-CM

## 2012-07-30 DIAGNOSIS — I4891 Unspecified atrial fibrillation: Secondary | ICD-10-CM

## 2012-07-30 NOTE — Progress Notes (Signed)
HPI The patient presents for follow up of HTN.   Since I last him he is to undergo adrenalectomy. 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  She is now status post resection. She brings a blood pressure diary and shows that her blood pressures are actually now quite low. Her heart rate tends to run about 100 areas he denies palpitations, presyncope or syncope. She's having a little breathlessness when she bends over which she thinks is related to her abdominal scar. However, she's not describing any PND or orthopnea. She's not noticing any palpitations, presyncope or syncope.   Allergies  Allergen Reactions  . Penicillins Anaphylaxis  . Acetaminophen Nausea And Vomiting and Other (See Comments)    Headache  . Alendronate Sodium Nausea Only and Other (See Comments)    Heartburn, stomach upset  . Celecoxib Other (See Comments)    Stomach upset, heart burn  . Codeine Nausea And Vomiting and Other (See Comments)    Headache  . Ventolin ([id number]) Palpitations    Rapid heart beat    Current Outpatient Prescriptions  Medication Sig Dispense Refill  . Artificial Tear Ointment (REFRESH LACRI-LUBE) OINT Apply 1 inch to eye at bedtime. Apply to bottom lid      . aspirin 325 MG EC tablet Take 325 mg by mouth daily.       . carboxymethylcellulose (REFRESH PLUS) 0.5 % SOLN Place 1 drop into both eyes 2 (two) times daily.      . Cholecalciferol (VITAMIN D3) 2000 UNITS TABS Take 1 capsule by mouth daily.       . flecainide (TAMBOCOR) 50 MG tablet Take 1 tablet (50 mg total) by mouth 2 (two) times daily.  60 tablet  6  . levothyroxine (SYNTHROID, LEVOTHROID) 137 MCG tablet Take 137 mcg by mouth every morning.       . loratadine (CLARITIN) 10 MG tablet Take 10 mg by mouth daily as needed.       . Multiple Vitamins-Minerals (CENTRUM SILVER) tablet Take 1 tablet by mouth every morning.       . Multiple Vitamins-Minerals (PRESERVISION/LUTEIN PO) Take 1 capsule by mouth daily.         Past Medical History  Diagnosis Date  . Palpitations JUNE 2012    piror to diagnosis of SVT  -NO PROBLMS WITH REOCCURANCE  . Mitral valve prolapse     per pt report  . MR (mitral regurgitation)     per 2-d echocardiogram  . GERD (gastroesophageal reflux disease)   . Thyroid cancer     age 66; s/p resection with subsequent hypothyroidism. 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 Pheochromocytoma   . Blurry vision, right eye     HX OF RT EYE VISION DISTURBANCE 01/2012 -PT SEEN BY DR. RANKIN--ELEVATED B/P  THOUGHT TO BE RELATED TO [place] SPOTTED FEVER THOUGHT TO BE CAUSE OF VISION PROBLEM-HAD LASER OF BOTH EYES (SLIGHT DAMAGE TO LEFT  EYE).    . Atrial fibrillation     PT ON FLECANIDE -DID NOT WANT TO BE ON BLOOD THINNERS OTHER THAN DAILY ASA  . Hypertension     B/P HAS BEEN UP AND DOWN BUT MOSTLY DOWN WHILE ON METOPROLOL -WAS TAKEN OFF METOROLOL IN NOV 2012 BY CARDIOLOGIST AND THEN IN MARCH 2013 PT'S B/P BECAME REALLY ELEVATED-SHE RESTARTED METOPROLOL  AND WAS GIVEN BENICAR.  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 Hypothyroidism   .  PONV (postoperative nausea and vomiting)     HX OF N&V AFTER SURGERIES-EXCEPT NO PROBLEM AFTER WRIST SURGERY IN 2012-AT [facility]  . Osteoarthritis     LITTLE FINGER LEFT HAND  . Headache     PAST HX MIGRAINES  . Vegetarian diet     DOES EAT EGGS AND DAIRY--NO MEATS OR FISH  . TMJ locking     PT STATES IF MOUTH OPEN EXTENDED TIME-LIKE AT DENTIST--HER JAWS LOCK  . Cataract     BILATERAL  . Hearing loss of right ear     Past Surgical History  Procedure Date  . Thyroidectomy   . Lung biopsies   . Appendectomy   . Wrist surgery   . Adrenalectomy 07/03/2012    Procedure: ADRENALECTOMY;  Surgeon: Crecencio Mc, MD;  Location: [facility];  Service: Urology;  Laterality: Right;    ROS:  As stated in the HPI and negative for all other systems.  PHYSICAL EXAM BP 120/70  Pulse 93  Ht 5\' 6"  (1.676 m)   Wt 135 lb (61.236 kg)  BMI 21.79 kg/m2 GENERAL:  Well appearing NECK:  No jugular venous distention, waveform within normal limits, carotid upstroke brisk and symmetric, no bruits, no thyromegaly, healed thyroidectomy scar LYMPHATICS:  No cervical, inguinal adenopathy LUNGS:  Clear to auscultation bilaterally BACK:  No CVA tenderness CHEST:  Unremarkable HEART:  PMI not displaced or sustained,S1 and S2 within normal limits, no S3, no S4, no clicks, no rubs, no murmurs ABD:  Flat, positive bowel sounds normal in frequency in pitch, no bruits, no rebound, no guarding, no midline pulsatile mass, no hepatomegaly, no splenomegaly, healing abdominal scar EXT:  2 plus pulses throughout, no edema, no cyanosis no clubbing, varicose veins   ASSESSMENT AND PLAN   HYPERTENSION -  Her blood pressure is now beautifully controlled. She will continue with the medicines as listed.  ATRIAL FIBRILLATION -  She has had no symptomatic or documented paroxysms of her atrial fibrillation since being on flecainide. No change in therapy is indicated.  PHEOCHROMOCYTOMA -  The patient feels much better since resection of this. No change in therapy is suggested.   TRICUSPID REGURGITATION - She has not had an echo since 2011. She had moderate TR and mild MR at that time. Her pulmonary pressures were slightly elevated. I will followup with an echocardiogram.

## 2012-07-30 NOTE — Patient Instructions (Addendum)
The current medical regimen is effective;  continue present plan and medications.  Your physician has requested that you have an echocardiogram. Echocardiography is a painless test that uses sound waves to create images of your heart. It provides your doctor with information about the size and shape of your heart and how well your heart's chambers and valves are working. This procedure takes approximately one hour. There are no restrictions for this procedure.  Follow up in 6 months with Dr Hochrein.  You will receive a letter in the mail 2 months before you are due.  Please call us when you receive this letter to schedule your follow up appointment.  

## 2012-08-05 ENCOUNTER — Ambulatory Visit (HOSPITAL_COMMUNITY): Payer: Medicare Other | Attending: Cardiology | Admitting: Radiology

## 2012-08-05 DIAGNOSIS — I379 Nonrheumatic pulmonary valve disorder, unspecified: Secondary | ICD-10-CM | POA: Insufficient documentation

## 2012-08-05 DIAGNOSIS — I10 Essential (primary) hypertension: Secondary | ICD-10-CM | POA: Insufficient documentation

## 2012-08-05 DIAGNOSIS — I059 Rheumatic mitral valve disease, unspecified: Secondary | ICD-10-CM

## 2012-08-05 DIAGNOSIS — I369 Nonrheumatic tricuspid valve disorder, unspecified: Secondary | ICD-10-CM | POA: Insufficient documentation

## 2012-08-05 DIAGNOSIS — I4891 Unspecified atrial fibrillation: Secondary | ICD-10-CM | POA: Insufficient documentation

## 2012-08-05 NOTE — Progress Notes (Signed)
Echocardiogram performed.  

## 2012-08-13 ENCOUNTER — Telehealth: Payer: Self-pay | Admitting: Cardiology

## 2012-08-13 NOTE — Telephone Encounter (Signed)
Pt rtn call to pam re results

## 2012-08-13 NOTE — Telephone Encounter (Signed)
Pt aware of results of echocardiogram

## 2013-01-21 ENCOUNTER — Ambulatory Visit (INDEPENDENT_AMBULATORY_CARE_PROVIDER_SITE_OTHER): Payer: Medicare Other | Admitting: Cardiology

## 2013-01-21 ENCOUNTER — Encounter: Payer: Self-pay | Admitting: Cardiology

## 2013-01-21 VITALS — BP 119/68 | HR 84 | Ht 66.0 in | Wt 146.0 lb

## 2013-01-21 DIAGNOSIS — I10 Essential (primary) hypertension: Secondary | ICD-10-CM

## 2013-01-21 DIAGNOSIS — I059 Rheumatic mitral valve disease, unspecified: Secondary | ICD-10-CM

## 2013-01-21 DIAGNOSIS — I4891 Unspecified atrial fibrillation: Secondary | ICD-10-CM

## 2013-01-21 MED ORDER — FLECAINIDE ACETATE 50 MG PO TABS: 50.0000 mg | ORAL_TABLET | Freq: Two times a day (BID) | ORAL | Status: DC

## 2013-01-21 NOTE — Patient Instructions (Addendum)
The current medical regimen is effective;  continue present plan and medications.  Your physician has requested that you have an exercise tolerance test. For further information please visit [email or web address] Please also follow instruction sheet, as given.  Please have blood work the same day as your treadmill test with Dr Antoine Poche.

## 2013-01-21 NOTE — Progress Notes (Signed)
HPI The patient presents for follow up of HTN.   She has had surgery for treatment of  pheochromocytoma.  After the last appointment I ordered an echocardiogram which demonstrated well-preserved ejection fraction, moderate tricuspid regurgitation with no evidence of pulmonary hypertension.  There was no significant mitral regurgitation which was identified previously. She's had no palpitations since I last saw her. She's had no presyncope or syncope. Her blood pressures have actually run low. She denies any chest pressure, neck or arm discomfort. She's had mild weight gain but no edema.  She has had significant cold intolerance which is new since her surgery.  Allergies  Allergen Reactions  . Penicillins Anaphylaxis  . Acetaminophen Nausea And Vomiting and Other (See Comments)    Headache  . Alendronate Sodium Nausea Only and Other (See Comments)    Heartburn, stomach upset  . Celecoxib Other (See Comments)    Stomach upset, heart burn  . Codeine Nausea And Vomiting and Other (See Comments)    Headache  . Ventolin (WUJ:[id number]) Palpitations    Rapid heart beat    Current Outpatient Prescriptions  Medication Sig Dispense Refill  . Artificial Tear Ointment (REFRESH LACRI-LUBE) OINT Apply 1 inch to eye at bedtime. Apply to bottom lid      . aspirin 325 MG EC tablet Take 325 mg by mouth daily.       . carboxymethylcellulose (REFRESH PLUS) 0.5 % SOLN Place 1 drop into both eyes 2 (two) times daily.      . Cholecalciferol (VITAMIN D3) 2000 UNITS TABS Take 1 capsule by mouth daily.       . fish oil-omega-3 fatty acids 1000 MG capsule Take 1 g by mouth daily.      . flecainide (TAMBOCOR) 50 MG tablet Take 1 tablet (50 mg total) by mouth 2 (two) times daily.  60 tablet  6  . levothyroxine (SYNTHROID, LEVOTHROID) 137 MCG tablet Take 137 mcg by mouth every morning.       . loratadine (CLARITIN) 10 MG tablet Take 10 mg by mouth daily as needed.       . Multiple Vitamins-Minerals (CENTRUM  SILVER) tablet Take 1 tablet by mouth every morning.       . Multiple Vitamins-Minerals (PRESERVISION/LUTEIN PO) Take 1 capsule by mouth daily.       No current facility-administered medications for this visit.    Past Medical History  Diagnosis Date  . Palpitations JUNE 2012    piror to diagnosis of SVT  -NO PROBLMS WITH REOCCURANCE  . Mitral valve prolapse     per pt report  . MR (mitral regurgitation)     per 2-d echocardiogram  . GERD (gastroesophageal reflux disease)   . Thyroid cancer     age 70; s/p resection with subsequent hypothyroidism. 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 Pheochromocytoma   . Blurry vision, right eye     HX OF RT EYE VISION DISTURBANCE 01/2012 -PT SEEN BY DR. RANKIN--ELEVATED B/P  THOUGHT TO BE RELATED TO [place] SPOTTED FEVER THOUGHT TO BE CAUSE OF VISION PROBLEM-HAD LASER OF BOTH EYES (SLIGHT DAMAGE TO LEFT  EYE).    . Atrial fibrillation     PT ON FLECANIDE -DID NOT WANT TO BE ON BLOOD THINNERS OTHER THAN DAILY ASA  . Hypertension     B/P HAS BEEN UP AND DOWN BUT MOSTLY DOWN WHILE ON METOPROLOL -WAS TAKEN OFF METOROLOL IN NOV 2012 BY CARDIOLOGIST AND THEN IN [facility]  2013 PT'S B/P BECAME REALLY ELEVATED-SHE RESTARTED METOPROLOL  AND WAS GIVEN BENICAR.  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 Hypothyroidism   . PONV (postoperative nausea and vomiting)     HX OF N&V AFTER SURGERIES-EXCEPT NO PROBLEM AFTER WRIST SURGERY IN 2012-AT [facility]  . Osteoarthritis     LITTLE FINGER LEFT HAND  . Headache     PAST HX MIGRAINES  . Vegetarian diet     DOES EAT EGGS AND DAIRY--NO MEATS OR FISH  . TMJ locking     PT STATES IF MOUTH OPEN EXTENDED TIME-LIKE AT DENTIST--HER JAWS LOCK  . Cataract     BILATERAL  . Hearing loss of right ear     Past Surgical History  Procedure Laterality Date  . Thyroidectomy    . Lung biopsies    . Appendectomy    . Wrist surgery    . Adrenalectomy  07/03/2012     Procedure: ADRENALECTOMY;  Surgeon: Crecencio Mc, MD;  Location: [facility];  Service: Urology;  Laterality: Right;    ROS:  As stated in the HPI and negative for all other systems.  PHYSICAL EXAM BP 119/68  Pulse 84  Ht 5\' 6"  (1.676 m)  Wt 146 lb (66.225 kg)  BMI 23.58 kg/m2 GENERAL:  Well appearing NECK:  No jugular venous distention, waveform within normal limits, carotid upstroke brisk and symmetric, no bruits, no thyromegaly, healed thyroidectomy scar LYMPHATICS:  No cervical, inguinal adenopathy LUNGS:  Clear to auscultation bilaterally BACK:  No CVA tenderness CHEST:  Unremarkable HEART:  PMI not displaced or sustained,S1 and S2 within normal limits, no S3, no S4, no clicks, no rubs, no murmurs ABD:  Flat, positive bowel sounds normal in frequency in pitch, no bruits, no rebound, no guarding, no midline pulsatile mass, no hepatomegaly, no splenomegaly, healing abdominal scar EXT:  2 plus pulses throughout, no edema, no cyanosis no clubbing, varicose veins SKIN:  Unremarkable NEURO:  Nonfocal, cranial nerves II through XII grossly intact, motor grossly intact throughout.  EKG: Sinus rhythm, rate 73, left ventricular hypertrophy by voltage criteria, repolarization changes, no acute ST-T wave changes. 01/21/2013   ASSESSMENT AND PLAN   HYPERTENSION -  Her blood pressure is now well controlled since surgery. No change in therapy is indicated.   ATRIAL FIBRILLATION -  She has had no symptomatic or documented paroxysms of her atrial fibrillation since being on flecainide. I will have her come back for an exercise treadmill test and a drug level.  PHEOCHROMOCYTOMA -  The patient feels much better since resection of this. No change in therapy is suggested. However, I have encouraged her to followup with her endocrinologist. She does have some symptoms such as cold intolerance. She's had normal thyroid studies and electrolytes. I'll defer followup such as cortisol level.  TRICUSPID  REGURGITATION - This is unchanged from previous. I will continue to follow this clinically.

## 2013-01-22 ENCOUNTER — Other Ambulatory Visit: Payer: Self-pay | Admitting: Family Medicine

## 2013-01-29 ENCOUNTER — Other Ambulatory Visit: Payer: Self-pay | Admitting: Family Medicine

## 2013-02-16 ENCOUNTER — Ambulatory Visit (INDEPENDENT_AMBULATORY_CARE_PROVIDER_SITE_OTHER): Payer: Medicare Other | Admitting: Family Medicine

## 2013-02-16 ENCOUNTER — Encounter: Payer: Self-pay | Admitting: Family Medicine

## 2013-02-16 VITALS — BP 125/66 | HR 72 | Temp 96.7°F | Ht 66.5 in | Wt 146.8 lb

## 2013-02-16 DIAGNOSIS — E559 Vitamin D deficiency, unspecified: Secondary | ICD-10-CM

## 2013-02-16 DIAGNOSIS — D3500 Benign neoplasm of unspecified adrenal gland: Secondary | ICD-10-CM

## 2013-02-16 DIAGNOSIS — E785 Hyperlipidemia, unspecified: Secondary | ICD-10-CM

## 2013-02-16 DIAGNOSIS — M810 Age-related osteoporosis without current pathological fracture: Secondary | ICD-10-CM

## 2013-02-16 DIAGNOSIS — E876 Hypokalemia: Secondary | ICD-10-CM

## 2013-02-16 DIAGNOSIS — I10 Essential (primary) hypertension: Secondary | ICD-10-CM

## 2013-02-16 LAB — BASIC METABOLIC PANEL WITH GFR
BUN: 12 mg/dL (ref 6–23)
CO2: 27 mEq/L (ref 19–32)
Chloride: 112 mEq/L (ref 96–112)
Creat: 0.6 mg/dL (ref 0.50–1.10)
GFR, Est Non African American: >89 mL/min
Potassium: 4.5 mEq/L (ref 3.5–5.3)

## 2013-02-16 LAB — HEPATIC FUNCTION PANEL
AST: 22 U/L (ref 0–37)
Albumin: 4 g/dL (ref 3.5–5.2)
Alkaline Phosphatase: 76 U/L (ref 39–117)
Indirect Bilirubin: 0.5 mg/dL (ref 0.0–0.9)
Total Bilirubin: 0.6 mg/dL (ref 0.3–1.2)

## 2013-02-16 LAB — POCT CBC
Granulocyte percent: 61.4 %G (ref 37–80)
Hemoglobin: 12.4 g/dL (ref 12.2–16.2)
MCH, POC: 29.8 pg (ref 27–31.2)
MCV: 88.8 fL (ref 80–97)
MPV: 7.3 fL (ref 0–99.8)
RBC: 4.2 M/uL (ref 4.04–5.48)

## 2013-02-16 NOTE — Patient Instructions (Signed)
Continue current medications. Continue therapeutic lifestyle changes. Return to clinic in 4 months

## 2013-02-16 NOTE — Progress Notes (Signed)
  Subjective:    Patient ID: [name], female    DOB: [birth year], 67 y.o.   MRN: [id number]  HPI This patient presents for recheck of multiple medical problems. No one accompanies the patient today.  Patient Active Problem List  Diagnosis  . HYPERTENSION, BENIGN  . MITRAL VALVE PROLAPSE  . Atrial fibrillation  . GERD  . OSTEOPOROSIS  . PALPITATIONS  . ADVERSE DRUG REACTION  . Pheochromocytoma  . Sinus tachycardia  . Hypokalemia    In addition, see most recent note from Dr. Antoine Poche. A stress test as being planned by him on April 25. Also see note from Dr. Cherene Altes at [facility].   .BP 125/66  Pulse 72  Temp(Src) 96.7 F (35.9 C) (Oral)  Ht 5' 6.5" (1.689 m)  Wt 146 lb 12.8 oz (66.588 kg)  BMI 23.34 kg/m2    The allergies, current medications, past medical history, surgical history, family and social history are reviewed.  Immunizations reviewed.  Health maintenance reviewed.  The following items are outstanding: None  .  Review of Systems  Constitutional: Negative.   HENT: Negative.   Eyes: Negative.   Respiratory: Negative.   Cardiovascular: Negative.   Gastrointestinal: Negative.   Endocrine: Positive for cold intolerance (due to adrenal removal).  Genitourinary: Negative.   Musculoskeletal: Positive for arthralgias.  Allergic/Immunologic: Positive for environmental allergies (seasonal).  Neurological: Positive for dizziness (due to hypotension, positional).  Psychiatric/Behavioral: Negative.        Objective:   Physical Exam BP 125/66  Pulse 72  Temp(Src) 96.7 F (35.9 C) (Oral)  Ht 5' 6.5" (1.689 m)  Wt 146 lb 12.8 oz (66.588 kg)  BMI 23.34 kg/m2  The patient appeared well nourished and normally developed, alert and oriented to time and place. Speech, behavior and judgement appear normal. Vital signs as documented.  Head exam is unremarkable. No scleral icterus or pallor noted.  Neck is without jugular venous  distension, thyromegally, or carotid bruits. Carotid upstrokes are brisk bilaterally. No cervical adenopathy. Lungs are clear anteriorly and posteriorly to auscultation. Normal respiratory effort. Cardiac exam reveals regular rate and rhythm @ 72/min. First and second heart sounds normal. No murmurs, rubs or gallops.  Abdominal exam reveals normal bowl sounds, no masses, no organomegaly and no aortic enlargement.  Extremities are nonedematous and both femoral and pedal pulses are normal. Skin without pallor or jaundice.  Warm and dry, without rash. Neurologic exam reveals normal deep tendon reflexes and normal sensation.          Assessment & Plan:  1. HYPERTENSION, BENIGN Actually she is on no medication at this time for her blood pressure  2. Hypokalemia  3. OSTEOPOROSIS Last DEXA scan was October of 2011. She refused one in 2013  4. Pheochromocytoma, unspecified laterality This is on the right side  5. Labs are being drawn today to followup on her above problem

## 2013-02-17 LAB — VITAMIN D 25 HYDROXY (VIT D DEFICIENCY, FRACTURES): Vit D, 25-Hydroxy: 41 ng/mL (ref 30–89)

## 2013-02-18 LAB — NMR LIPOPROFILE WITH LIPIDS
HDL Particle Number: 35.5 umol/L (ref >=30.5)
HDL Size: 10.8 nm (ref >=9.2)
HDL-C: 94 mg/dL (ref >=40)
Large HDL-P: >20 umol/L (ref >=4.8)
Large VLDL-P: 1.9 nmol/L (ref <=2.7)
Triglycerides: 33 mg/dL (ref <150)

## 2013-02-23 ENCOUNTER — Encounter: Payer: Self-pay | Admitting: *Deleted

## 2013-02-26 ENCOUNTER — Other Ambulatory Visit (INDEPENDENT_AMBULATORY_CARE_PROVIDER_SITE_OTHER): Payer: Medicare Other

## 2013-02-26 ENCOUNTER — Ambulatory Visit (INDEPENDENT_AMBULATORY_CARE_PROVIDER_SITE_OTHER): Payer: Medicare Other | Admitting: Cardiology

## 2013-02-26 DIAGNOSIS — I4891 Unspecified atrial fibrillation: Secondary | ICD-10-CM

## 2013-02-26 DIAGNOSIS — I059 Rheumatic mitral valve disease, unspecified: Secondary | ICD-10-CM

## 2013-02-26 DIAGNOSIS — R0989 Other specified symptoms and signs involving the circulatory and respiratory systems: Secondary | ICD-10-CM

## 2013-02-26 NOTE — Procedures (Signed)
Exercise Treadmill Test  Pre-Exercise Testing Evaluation Rhythm: normal sinus  Rate: 74     Test  Exercise Tolerance Test Ordering MD: Angelina Sheriff, MD  Interpreting MD: Angelina Sheriff, MD  Unique Test No: 1   Treadmill:  1  Indication for ETT: htn  Contraindication to ETT: No   Stress Modality: exercise - treadmill  Cardiac Imaging Performed: non   Protocol: standard Bruce - maximal  Max BP:  172/62  Max MPHR (bpm):  154 85% MPR (bpm):  133  MPHR obtained (bpm):  150 % MPHR obtained:  90  Reached 85% MPHR (min:sec):  3:15 Total Exercise Time (min-sec):  4:38  Workload in METS:  6.4 Borg Scale: 19  Reason ETT Terminated:  desired heart rate attained    ST Segment Analysis At Rest: non-specific ST segment slurring With Exercise: non-specific ST changes  Other Information Arrhythmia:  No Angina during ETT:  absent (0) Quality of ETT:  non-diagnostic  ETT Interpretation:  borderline (indeterminate) with non-specific ST changes  Comments: This study was done to evaluate dysrhythmia on flecainide. She started the exam with an interventricular conduction delay and had exacerbation of this with increased rate and resolution when her rate resolved. She had an uninterpretable EKG from ST segment standpoint. However, at her usual level of exertion and peak level that she was performed there were no dysrhythmias. There was a brief run of nonsustained ventricular tachycardia but only when she was well above the level of exertion that she would achieve on her own.  Recommendations: I will be checking a flecainide level. There was no proarrhythmia at the level of exertion that she would maximally achieve any real life situation. Therefore, I think that she can continue the flecainide.

## 2013-04-14 ENCOUNTER — Encounter: Payer: Self-pay | Admitting: Family Medicine

## 2013-04-14 ENCOUNTER — Ambulatory Visit (INDEPENDENT_AMBULATORY_CARE_PROVIDER_SITE_OTHER): Payer: Medicare Other | Admitting: Family Medicine

## 2013-04-14 ENCOUNTER — Ambulatory Visit (INDEPENDENT_AMBULATORY_CARE_PROVIDER_SITE_OTHER): Payer: Medicare Other

## 2013-04-14 ENCOUNTER — Telehealth: Payer: Self-pay | Admitting: Family Medicine

## 2013-04-14 VITALS — BP 115/71 | HR 99 | Temp 97.4°F | Ht 66.5 in | Wt 145.8 lb

## 2013-04-14 DIAGNOSIS — R05 Cough: Secondary | ICD-10-CM

## 2013-04-14 DIAGNOSIS — R0989 Other specified symptoms and signs involving the circulatory and respiratory systems: Secondary | ICD-10-CM

## 2013-04-14 DIAGNOSIS — R509 Fever, unspecified: Secondary | ICD-10-CM

## 2013-04-14 LAB — POCT CBC
Hemoglobin: 14.1 g/dL (ref 12.2–16.2)
MCHC: 33.9 g/dL (ref 31.8–35.4)
MPV: 7.5 fL (ref 0–99.8)
POC Granulocyte: 5.8 (ref 2–6.9)
POC LYMPH PERCENT: 10.6 %L (ref 10–50)
RDW, POC: 12.3 %
WBC: 7 10*3/uL (ref 4.6–10.2)

## 2013-04-14 NOTE — Patient Instructions (Addendum)
Take medications as directed. You may use either Mucinex or Delsym for the cough Drink plenty of fluids. Take Tylenol for aches pains or fever

## 2013-04-14 NOTE — Progress Notes (Signed)
  Subjective:    Patient ID: [name], female    DOB: 1946/07/09, 67 y.o.   MRN: [id number]  HPI Patient presents today with fever cough is productive, left ear pain and sore throat for 4 days. She had pretty severe chills 3 days ago and the fever broke a couple days ago.   Review of Systems  Constitutional: Positive for fever.  HENT: Positive for ear pain (L ear), congestion (started Friday) and sore throat.   Respiratory: Positive for cough (prod. at times, green). Negative for shortness of breath.   Neurological: Positive for headaches.       Objective:   Physical Exam  Nursing note and vitals reviewed. Constitutional: She is oriented to person, place, and time. No distress.  Somewhat pale and weak looking  HENT:  Head: Normocephalic and atraumatic.  Right Ear: External ear normal.  Left Ear: External ear normal.  Mouth/Throat: Oropharyngeal exudate present.  Left TM slightly red Nasal congestion bilaterally.  Eyes: Conjunctivae are normal. Right eye exhibits no discharge. Left eye exhibits no discharge. No scleral icterus.  Neck: Normal range of motion. Neck supple. No thyromegaly present.  Cardiovascular: Normal rate and regular rhythm.  Exam reveals friction rub. Exam reveals no gallop.   No murmur heard. At 84 per minute  Pulmonary/Chest: Effort normal and breath sounds normal. No respiratory distress. She has no wheezes. She has no rales.  Dry cough  Neurological: She is alert and oriented to person, place, and time.  Skin: Skin is warm and dry. She is not diaphoretic.  Psychiatric: She has a normal mood and affect. Her behavior is normal. Judgment and thought content normal.   Results for orders placed in visit on 04/14/13  POCT CBC      Result Value Range   WBC 7.0  4.6 - 10.2 K/uL   Lymph, poc 0.7  0.6 - 3.4   POC LYMPH PERCENT 10.6  10 - 50 %L   POC Granulocyte 5.8  2 - 6.9   Granulocyte percent 83.5 (*) 37 - 80 %G   RBC 4.6  4.04 - 5.48 M/uL   Hemoglobin 14.1  12.2 - 16.2 g/dL   HCT, POC 40.9  81.1 - 47.9 %   MCV 89.7  80 - 97 fL   MCH, POC 30.4  27 - 31.2 pg   MCHC 33.9  31.8 - 35.4 g/dL   RDW, POC 91.4     Platelet Count, POC 150.0  142 - 424 K/uL   MPV 7.5  0 - 99.8 fL   [facility] reading (PRIMARY) by  Dr.Ryzen Deady: Chest x-ray-within normal limit                                        Assessment & Plan:  1. Cough - POCT CBC - DG Chest 2 View  2. Fever - POCT CBC - DG Chest 2 View  3. Chest congestion - POCT CBC - DG Chest 2 View   Patient Instructions  Take medications as directed. You may use either Mucinex or Delsym for the cough Drink plenty of fluids. Take Tylenol for aches pains or fever     Doxycycline 100 mg twice daily with food for 10 day

## 2013-04-14 NOTE — Telephone Encounter (Signed)
APPT MADE

## 2013-06-18 ENCOUNTER — Ambulatory Visit (INDEPENDENT_AMBULATORY_CARE_PROVIDER_SITE_OTHER): Payer: Medicare Other | Admitting: Family Medicine

## 2013-06-18 ENCOUNTER — Encounter: Payer: Self-pay | Admitting: Family Medicine

## 2013-06-18 VITALS — BP 103/59 | HR 74 | Temp 96.6°F | Ht 66.5 in | Wt 148.2 lb

## 2013-06-18 DIAGNOSIS — E039 Hypothyroidism, unspecified: Secondary | ICD-10-CM

## 2013-06-18 DIAGNOSIS — J309 Allergic rhinitis, unspecified: Secondary | ICD-10-CM

## 2013-06-18 DIAGNOSIS — R5383 Other fatigue: Secondary | ICD-10-CM

## 2013-06-18 DIAGNOSIS — E559 Vitamin D deficiency, unspecified: Secondary | ICD-10-CM

## 2013-06-18 DIAGNOSIS — M810 Age-related osteoporosis without current pathological fracture: Secondary | ICD-10-CM

## 2013-06-18 DIAGNOSIS — Z85850 Personal history of malignant neoplasm of thyroid: Secondary | ICD-10-CM | POA: Insufficient documentation

## 2013-06-18 DIAGNOSIS — Z79899 Other long term (current) drug therapy: Secondary | ICD-10-CM | POA: Insufficient documentation

## 2013-06-18 DIAGNOSIS — I498 Other specified cardiac arrhythmias: Secondary | ICD-10-CM

## 2013-06-18 DIAGNOSIS — B354 Tinea corporis: Secondary | ICD-10-CM

## 2013-06-18 DIAGNOSIS — I471 Supraventricular tachycardia: Secondary | ICD-10-CM

## 2013-06-18 LAB — POCT CBC
Granulocyte percent: 63 %G (ref 37–80)
Hemoglobin: 12.6 g/dL (ref 12.2–16.2)
Lymph, poc: 1.7 (ref 0.6–3.4)
MCH, POC: 29.6 pg (ref 27–31.2)
MPV: 7.5 fL (ref 0–99.8)
POC LYMPH PERCENT: 29.4 %L (ref 10–50)
Platelet Count, POC: 160 10*3/uL (ref 142–424)
RBC: 4.3 M/uL (ref 4.04–5.48)

## 2013-06-18 NOTE — Patient Instructions (Addendum)
Fall precautions discussed Continue current meds and therapeutic lifestyle changes Return to clinic in September or October for a flu shot Schedule pap in October Schedule DEXA scan Please use your access code to create your My Chart account so that you can view your lab and x-ray results and communicate electronically. Lamisil over-the-counter for fungal infection of skin, use as directed

## 2013-06-18 NOTE — Progress Notes (Signed)
Subjective:    Patient ID: [name], female    DOB: 09-29-1946, 67 y.o.   MRN: [id number]  HPI Patient returns to clinic today for followup of chronic medical problems. The patient sees the cardiologist periodically. She has a history of SVT thyroid cancer, osteoporosis, and vitamin D deficiency. In the past she has refused getting DEXA scanning and getting a colonoscopy. She is due to get her mammogram and this will be scheduled for September. Patient will see the cardiologist this month. She had a stress test from him in May,  and did well with this. She will return her FOBT. She will reconsider getting a DEXA scan.   Review of Systems  Constitutional: Positive for fatigue (slight).  HENT: Negative for ear pain, congestion, sore throat, rhinorrhea, sneezing, postnasal drip and sinus pressure.   Eyes: Negative.  Negative for photophobia, pain, discharge, redness, itching and visual disturbance.  Respiratory: Negative.  Negative for cough, choking, chest tightness, shortness of breath and wheezing.   Cardiovascular: Negative.  Negative for chest pain, palpitations and leg swelling.  Gastrointestinal: Negative.  Negative for vomiting, diarrhea, constipation, anal bleeding and rectal pain.  Endocrine: Negative.  Negative for cold intolerance, heat intolerance, polydipsia, polyphagia and polyuria.  Genitourinary: Negative.  Negative for dysuria, urgency, frequency, hematuria, vaginal bleeding, vaginal discharge, difficulty urinating and vaginal pain.  Musculoskeletal: Negative.  Negative for myalgias, back pain, joint swelling, arthralgias and gait problem.  Skin: Positive for rash (eczema bilateral hands).  Allergic/Immunologic: Positive for environmental allergies (seasonal).  Neurological: Positive for headaches (occasional with fatigue). Negative for dizziness, tremors, syncope and weakness.  Hematological: Negative.   Psychiatric/Behavioral: Negative.  Negative for confusion, sleep  disturbance, decreased concentration and agitation. The patient is not nervous/anxious.        Objective:   Physical Exam BP 103/59  Pulse 74  Temp(Src) 96.6 F (35.9 C) (Oral)  Ht 5' 6.5" (1.689 m)  Wt 148 lb 3.2 oz (67.223 kg)  BMI 23.56 kg/m2  The patient appeared well nourished and normally developed for her, alert and oriented to time and place. Speech, behavior and judgement appear normal. Her skin always has pallor. Vital signs as documented.  Head exam is unremarkable. No scleral icterus or pallor noted. Mouth throat and ear canals were within normal limit. She has nasal congestion bilaterally. Neck is without jugular venous distension, thyromegally, or carotid bruits. Carotid upstrokes are brisk bilaterally. No cervical adenopathy. Lungs are clear anteriorly and posteriorly to auscultation. Normal respiratory effort. Cardiac exam reveals regular rate and rhythm at 72 per minute. First and second heart sounds normal.  No murmurs, rubs or gallops.  Abdominal exam reveals normal bowl sounds, no masses, no organomegaly and no aortic enlargement. No inguinal adenopathy. Extremities are nonedematous and both femoral and pedal pulses are normal. Skin with pallor as usual but no jaundice.  Warm and dry, without rash. There is a small healing area of tenia on her right abdomen. Neurologic exam reveals normal deep tendon reflexes and normal sensation.          Assessment & Plan:  1. Hypothyroid - Thyroid Panel With TSH  2. Vitamin D deficiency - Vitamin D 25 hydroxy; Standing - Vitamin D 25 hydroxy  3. Osteoporosis -Patient will consider getting a DEXA  4. SVT (supraventricular tachycardia) - NMR, lipoprofile; Standing - NMR, lipoprofile  5. Fatigue - POCT CBC; Standing - BMP8+EGFR; Standing - POCT CBC - BMP8+EGFR  6. High risk medication use - Hepatic function panel; Standing -  Hepatic function panel  7. Allergic rhinitis  8. Tinea corporis -Use Lamisil  over-the-counter  Patient Instructions  Fall precautions discussed Continue current meds and therapeutic lifestyle changes Return to clinic in September or October for a flu shot Schedule pap in October Schedule DEXA scan Please use your access code to create your My Chart account so that you can view your lab and x-ray results and communicate electronically. Lamisil over-the-counter for fungal infection of skin, use as directed   Be sure and get pelvic exam and breast exam in October  Nyra Capes MD

## 2013-06-20 LAB — HEPATIC FUNCTION PANEL
ALT: 16 IU/L (ref 0–32)
Albumin: 4.3 g/dL (ref 3.6–4.8)
Bilirubin, Direct: 0.14 mg/dL (ref 0.00–0.40)
Total Bilirubin: 0.4 mg/dL (ref 0.0–1.2)
Total Protein: 6.9 g/dL (ref 6.0–8.5)

## 2013-06-20 LAB — BMP8+EGFR
BUN: 18 mg/dL (ref 8–27)
CO2: 24 mmol/L (ref 18–29)
Calcium: 9.2 mg/dL (ref 8.6–10.2)
Creatinine, Ser: 0.61 mg/dL (ref 0.57–1.00)
GFR calc non Af Amer: 94 mL/min/{1.73_m2} (ref >59)
Glucose: 92 mg/dL (ref 65–99)

## 2013-06-20 LAB — NMR, LIPOPROFILE
Cholesterol: 163 mg/dL (ref <200)
HDL Cholesterol by NMR: 88 mg/dL (ref >=40)
HDL Particle Number: 35.5 umol/L (ref >=30.5)
LDL Size: 21.3 nm (ref >20.5)
Small LDL Particle Number: <90 nmol/L (ref <=527)
Triglycerides by NMR: 69 mg/dL (ref <150)

## 2013-06-20 LAB — THYROID PANEL WITH TSH: TSH: 0.008 u[IU]/mL — ABNORMAL LOW (ref 0.450–4.500)

## 2013-07-01 ENCOUNTER — Encounter: Payer: Self-pay | Admitting: Cardiology

## 2013-07-01 ENCOUNTER — Ambulatory Visit (INDEPENDENT_AMBULATORY_CARE_PROVIDER_SITE_OTHER): Payer: Medicare Other | Admitting: Cardiology

## 2013-07-01 VITALS — BP 98/61 | HR 71 | Ht 66.0 in | Wt 151.0 lb

## 2013-07-01 DIAGNOSIS — I4891 Unspecified atrial fibrillation: Secondary | ICD-10-CM

## 2013-07-01 NOTE — Patient Instructions (Addendum)
Continue current medications as listed.  Follow up in 1 year with Dr Hochrein.  You will receive a letter in the mail 2 months before you are due.  Please call us when you receive this letter to schedule your follow up appointment.  

## 2013-07-01 NOTE — Progress Notes (Signed)
HPI The patient presents for follow up of HTN.   She has had surgery for treatment of  pheochromocytoma.  She has moderate tricuspid regurgitation with no evidence of pulmonary hypertension.  There was no significant mitral regurgitation which was identified previously. Since I last saw her she's had no palpitations. She's had no presyncope or syncope. Her blood pressures have continues to run low. She denies any chest pressure, neck or arm discomfort. She's had mild weight gain but no edema.  She is still working.  Allergies  Allergen Reactions  . Penicillins Anaphylaxis  . Ceclor [Cefaclor] Hives  . Acetaminophen Nausea And Vomiting and Other (See Comments)    Headache  . Alendronate Sodium Nausea Only and Other (See Comments)    Heartburn, stomach upset  . Celecoxib Other (See Comments)    Stomach upset, heart burn  . Codeine Nausea And Vomiting and Other (See Comments)    Headache  . Ventolin [Kdc:Albuterol] Palpitations    Rapid heart beat    Current Outpatient Prescriptions  Medication Sig Dispense Refill  . Artificial Tear Ointment (REFRESH LACRI-LUBE) OINT Apply 1 inch to eye at bedtime. Apply to bottom lid      . aspirin 325 MG EC tablet Take 325 mg by mouth daily.       . carboxymethylcellulose (REFRESH PLUS) 0.5 % SOLN Place 1 drop into both eyes 2 (two) times daily.      . Cholecalciferol (VITAMIN D3) 2000 UNITS TABS Take 1.5 capsules by mouth daily.       . flecainide (TAMBOCOR) 50 MG tablet Take 1 tablet (50 mg total) by mouth 2 (two) times daily.  60 tablet  6  . loratadine (CLARITIN) 10 MG tablet Take 10 mg by mouth daily as needed.       . Multiple Vitamins-Minerals (CENTRUM SILVER) tablet Take 1 tablet by mouth every morning.       . Multiple Vitamins-Minerals (PRESERVISION/LUTEIN PO) Take 1 capsule by mouth daily.      . Omega-3 Fatty Acids (FISH OIL) 1200 MG CAPS Take 1 capsule by mouth daily.      Marland Kitchen SYNTHROID 137 MCG tablet TAKE ONE TABLET BY MOUTH EVERY DAY  30  tablet  5   No current facility-administered medications for this visit.    Past Medical History  Diagnosis Date  . Palpitations JUNE 2012    piror to diagnosis of SVT  -NO PROBLMS WITH REOCCURANCE  . Mitral valve prolapse     per pt report  . MR (mitral regurgitation)     per 2-d echocardiogram  . GERD (gastroesophageal reflux disease)   . Thyroid cancer     age 67; s/p resection with subsequent hypothyroidism. 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 Pheochromocytoma   . Blurry vision, right eye     HX OF RT EYE VISION DISTURBANCE 01/2012 -PT SEEN BY DR. RANKIN--ELEVATED B/P  THOUGHT TO BE RELATED TO [place] SPOTTED FEVER THOUGHT TO BE CAUSE OF VISION PROBLEM-HAD LASER OF BOTH EYES (SLIGHT DAMAGE TO LEFT  EYE).    . Atrial fibrillation     PT ON FLECANIDE -DID NOT WANT TO BE ON BLOOD THINNERS OTHER THAN DAILY ASA  . Hypertension     B/P HAS BEEN UP AND DOWN BUT MOSTLY DOWN WHILE ON METOPROLOL -WAS TAKEN OFF METOROLOL IN NOV 2012 BY CARDIOLOGIST AND THEN IN MARCH 2013 PT'S B/P BECAME REALLY ELEVATED-SHE RESTARTED METOPROLOL  AND WAS GIVEN BENICAR.  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 Hypothyroidism   . PONV (postoperative nausea and vomiting)     HX OF N&V AFTER SURGERIES-EXCEPT NO PROBLEM AFTER WRIST SURGERY IN 2012-[facility]  . Osteoarthritis     LITTLE FINGER LEFT HAND  . Headache(784.0)     PAST HX MIGRAINES  . Vegetarian diet     DOES EAT EGGS AND DAIRY--NO MEATS OR FISH  . TMJ locking     PT STATES IF MOUTH OPEN EXTENDED TIME-LIKE AT DENTIST--HER JAWS LOCK  . Cataract     BILATERAL  . Hearing loss of right ear     Past Surgical History  Procedure Laterality Date  . Thyroidectomy    . Lung biopsies    . Appendectomy    . Wrist surgery    . Adrenalectomy  07/03/2012    Procedure: ADRENALECTOMY;  Surgeon: Crecencio Mc, MD;  Location: [facility];  Service: Urology;  Laterality: Right;    ROS:  As stated in the  HPI and negative for all other systems.  PHYSICAL EXAM BP 98/61  Pulse 71  Ht 5\' 6"  (1.676 m)  Wt 151 lb (68.493 kg)  BMI 24.38 kg/m2 GENERAL:  Well appearing NECK:  No jugular venous distention, waveform within normal limits, carotid upstroke brisk and symmetric, no bruits, no thyromegaly, healed thyroidectomy scar LYMPHATICS:  No cervical, inguinal adenopathy LUNGS:  Clear to auscultation bilaterally BACK:  No CVA tenderness CHEST:  Unremarkable HEART:  PMI not displaced or sustained,S1 and S2 within normal limits, no S3, no S4, no clicks, no rubs, no murmurs ABD:  Flat, positive bowel sounds normal in frequency in pitch, no bruits, no rebound, no guarding, no midline pulsatile mass, no hepatomegaly, no splenomegaly, healing abdominal scar EXT:  2 plus pulses throughout, no edema, no cyanosis no clubbing, varicose veins SKIN:  Unremarkable NEURO:  Nonfocal, cranial nerves II through XII grossly intact, motor grossly intact throughout.  EKG: Sinus rhythm, rate 71, left ventricular hypertrophy by voltage criteria, repolarization changes, no acute ST-T wave changes. 07/01/2013   ASSESSMENT AND PLAN   HYPERTENSION -  Her blood pressure is now running low. No change in therapy is indicated.   ATRIAL FIBRILLATION -  She has had no symptomatic or documented paroxysms of her atrial fibrillation since being on flecainide.her drug level was low level normal. Her exercise treadmill test demonstrated no proarrhythmia.   TRICUSPID REGURGITATION - This is unchanged from previous. I will continue to follow this clinically.

## 2013-07-23 ENCOUNTER — Other Ambulatory Visit: Payer: Self-pay | Admitting: Family Medicine

## 2013-08-19 ENCOUNTER — Encounter: Payer: Self-pay | Admitting: Nurse Practitioner

## 2013-08-19 ENCOUNTER — Ambulatory Visit (INDEPENDENT_AMBULATORY_CARE_PROVIDER_SITE_OTHER): Payer: Medicare Other | Admitting: Nurse Practitioner

## 2013-08-19 VITALS — BP 120/65 | HR 79 | Temp 96.9°F | Ht 66.0 in | Wt 149.0 lb

## 2013-08-19 DIAGNOSIS — Z124 Encounter for screening for malignant neoplasm of cervix: Secondary | ICD-10-CM

## 2013-08-19 DIAGNOSIS — Z23 Encounter for immunization: Secondary | ICD-10-CM

## 2013-08-19 DIAGNOSIS — Z01419 Encounter for gynecological examination (general) (routine) without abnormal findings: Secondary | ICD-10-CM

## 2013-08-19 LAB — POCT URINALYSIS DIPSTICK
Bilirubin, UA: NEGATIVE
Ketones, UA: NEGATIVE
Leukocytes, UA: NEGATIVE
Spec Grav, UA: 1.01
pH, UA: 5

## 2013-08-19 NOTE — Progress Notes (Addendum)
  Subjective:    Patient ID: [name], female    DOB: May 11, 1946, 67 y.o.   MRN: [id number]  HPI  [name] is a regular patient of Dr. Christell Constant who was sent for a PAP and breast exam- She is doing well and had follow up with Dr. Christell Constant in August 2014.    Review of Systems  All other systems reviewed and are negative.       Objective:   Physical Exam  Constitutional: She is oriented to person, place, and time. She appears well-developed and well-nourished.  HENT:  Head: Normocephalic.  Right Ear: Hearing, tympanic membrane, external ear and ear canal normal.  Left Ear: Hearing, tympanic membrane, external ear and ear canal normal.  Nose: Nose normal.  Mouth/Throat: Uvula is midline and oropharynx is clear and moist.  Eyes: Conjunctivae and EOM are normal. Pupils are equal, round, and reactive to light.  Neck: Normal range of motion and full passive range of motion without pain. Neck supple. No JVD present. Carotid bruit is not present. No mass and no thyromegaly present.  Cardiovascular: Normal rate, normal heart sounds and intact distal pulses.   No murmur heard. Pulmonary/Chest: Effort normal and breath sounds normal. Right breast exhibits no inverted nipple, no mass, no nipple discharge, no skin change and no tenderness. Left breast exhibits no inverted nipple, no mass, no nipple discharge, no skin change and no tenderness.  Abdominal: Soft. Bowel sounds are normal. She exhibits no mass. There is no tenderness.  Genitourinary: Vagina normal and uterus normal. No breast swelling, tenderness, discharge or bleeding.  bimanual exam-No adnexal masses or tenderness. Cervix parous and pink no discharge  Musculoskeletal: Normal range of motion.  Lymphadenopathy:    She has no cervical adenopathy.  Neurological: She is alert and oriented to person, place, and time.  Skin: Skin is warm and dry.  Psychiatric: She has a normal mood and affect. Her behavior is normal. Judgment and  thought content normal.    BP 120/65  Pulse 79  Temp(Src) 96.9 F (36.1 C) (Oral)  Ht 5\' 6"  (1.676 m)  Wt 149 lb (67.586 kg)  BMI 24.06 kg/m2       Assessment & Plan:  1. Encounter for routine gynecological examination Keep follow up appointment with Dr, Christell Constant - POCT urinalysis dipstick - Pap IG (Image Guided)  Mary-Margaret Daphine Deutscher, FNP

## 2013-08-19 NOTE — Patient Instructions (Signed)

## 2013-08-22 ENCOUNTER — Other Ambulatory Visit: Payer: Self-pay | Admitting: Cardiology

## 2013-08-24 LAB — PAP IG (IMAGE GUIDED): PAP Smear Comment: 0

## 2013-10-21 ENCOUNTER — Ambulatory Visit (INDEPENDENT_AMBULATORY_CARE_PROVIDER_SITE_OTHER): Payer: Medicare Other | Admitting: Family Medicine

## 2013-10-21 ENCOUNTER — Encounter: Payer: Self-pay | Admitting: Family Medicine

## 2013-10-21 VITALS — BP 100/60 | HR 75 | Temp 97.5°F | Ht 66.0 in | Wt 151.0 lb

## 2013-10-21 DIAGNOSIS — Z780 Asymptomatic menopausal state: Secondary | ICD-10-CM

## 2013-10-21 DIAGNOSIS — K219 Gastro-esophageal reflux disease without esophagitis: Secondary | ICD-10-CM

## 2013-10-21 DIAGNOSIS — M810 Age-related osteoporosis without current pathological fracture: Secondary | ICD-10-CM

## 2013-10-21 DIAGNOSIS — E559 Vitamin D deficiency, unspecified: Secondary | ICD-10-CM | POA: Insufficient documentation

## 2013-10-21 DIAGNOSIS — I4891 Unspecified atrial fibrillation: Secondary | ICD-10-CM

## 2013-10-21 DIAGNOSIS — D3500 Benign neoplasm of unspecified adrenal gland: Secondary | ICD-10-CM

## 2013-10-21 DIAGNOSIS — E039 Hypothyroidism, unspecified: Secondary | ICD-10-CM | POA: Insufficient documentation

## 2013-10-21 DIAGNOSIS — Z23 Encounter for immunization: Secondary | ICD-10-CM

## 2013-10-21 DIAGNOSIS — D3501 Benign neoplasm of right adrenal gland: Secondary | ICD-10-CM

## 2013-10-21 DIAGNOSIS — I10 Essential (primary) hypertension: Secondary | ICD-10-CM

## 2013-10-21 LAB — POCT CBC
Granulocyte percent: 64.8 %G (ref 37–80)
Lymph, poc: 1.9 (ref 0.6–3.4)
MCHC: 32.4 g/dL (ref 31.8–35.4)
MPV: 8.1 fL (ref 0–99.8)
POC Granulocyte: 4.3 (ref 2–6.9)
POC LYMPH PERCENT: 29.3 %L (ref 10–50)
Platelet Count, POC: 203 10*3/uL (ref 142–424)
RDW, POC: 13.1 %
WBC: 6.6 10*3/uL (ref 4.6–10.2)

## 2013-10-21 NOTE — Addendum Note (Signed)
Addended by: Orma Render F on: 10/21/2013 11:56 AM   Modules accepted: Orders

## 2013-10-21 NOTE — Progress Notes (Signed)
Subjective:    Patient ID: [name], female    DOB: [birth year], 67 y.o.   MRN: [id number]  HPI Pt here for follow up and management of chronic medical problems. The patient is doing well overall, she is concerned about some recent weight gain.she continues to see the endocrinologist, a cardiologist and the specialist.      Patient Active Problem List   Diagnosis Date Noted  . Hx of thyroid cancer 06/18/2013  . High risk medication use 06/18/2013  . Sinus tachycardia 07/06/2012  . Hypokalemia 07/06/2012  . Pheochromocytoma, history of 05/21/2012  . Hypertension 04/10/2010  . Atrial fibrillation 04/04/2010  . MITRAL VALVE PROLAPSE 03/28/2010  . GERD 03/28/2010  . OSTEOPOROSIS 03/28/2010   Outpatient Encounter Prescriptions as of 10/21/2013  Medication Sig  . Artificial Tear Ointment (REFRESH LACRI-LUBE) OINT Apply 1 inch to eye at bedtime. Apply to bottom lid  . aspirin 325 MG EC tablet Take 325 mg by mouth daily.   . carboxymethylcellulose (REFRESH PLUS) 0.5 % SOLN Place 1 drop into both eyes 2 (two) times daily.  . Cholecalciferol (VITAMIN D3) 2000 UNITS TABS Take 1.5 capsules by mouth daily.   . flecainide (TAMBOCOR) 50 MG tablet TAKE 1 TABLET (50 MG TOTAL) BY MOUTH 2 (TWO) TIMES DAILY.  . Multiple Vitamins-Minerals (CENTRUM SILVER) tablet Take 1 tablet by mouth every morning.   . Multiple Vitamins-Minerals (PRESERVISION/LUTEIN PO) Take 1 capsule by mouth daily.  . Omega-3 Fatty Acids (FISH OIL) 1200 MG CAPS Take 1 capsule by mouth daily.  Marland Kitchen SYNTHROID 137 MCG tablet TAKE ONE TABLET BY MOUTH EVERY DAY  . loratadine (CLARITIN) 10 MG tablet Take 10 mg by mouth daily as needed.     Review of Systems  Constitutional: Negative.   HENT: Negative.   Eyes: Negative.   Respiratory: Negative.   Cardiovascular: Negative.   Gastrointestinal: Negative.   Endocrine: Negative.   Genitourinary: Negative.   Musculoskeletal: Negative.   Skin: Negative.   Allergic/Immunologic:  Negative.   Neurological: Negative.   Hematological: Negative.   Psychiatric/Behavioral: Negative.        Objective:   Physical Exam  Nursing note and vitals reviewed. Constitutional: She is oriented to person, place, and time. She appears well-developed and well-nourished. No distress.  HENT:  Head: Normocephalic and atraumatic.  Right Ear: External ear normal.  Left Ear: External ear normal.  Nose: Nose normal.  Mouth/Throat: Oropharynx is clear and moist.  Eyes: Conjunctivae and EOM are normal. Pupils are equal, round, and reactive to light. Right eye exhibits no discharge. Left eye exhibits no discharge. No scleral icterus.  Neck: Normal range of motion. Neck supple. No JVD present. No thyromegaly present.  No carotid bruits  Cardiovascular: Normal rate, regular rhythm, normal heart sounds and intact distal pulses.  Exam reveals no gallop and no friction rub.   No murmur heard. The heart has a regular rhythm today at 72 per minute  Pulmonary/Chest: Effort normal and breath sounds normal. No respiratory distress. She has no wheezes. She has no rales. She exhibits no tenderness.  Abdominal: Soft. Bowel sounds are normal. She exhibits no mass. There is no tenderness. There is no rebound and no guarding.  Musculoskeletal: Normal range of motion. She exhibits no edema and no tenderness.  Lymphadenopathy:    She has no cervical adenopathy.  Neurological: She is alert and oriented to person, place, and time. She has normal reflexes. No cranial nerve deficit.  Skin: Skin is warm and dry.  Psychiatric:  She has a normal mood and affect. Her behavior is normal. Judgment and thought content normal.   BP 100/60  Pulse 75  Temp(Src) 97.5 F (36.4 C) (Oral)  Ht 5\' 6"  (1.676 m)  Wt 151 lb (68.493 kg)  BMI 24.38 kg/m2        Assessment & Plan:   1. Hypertension - Lipid panel - Hepatic function panel - Brain natriuretic peptide - POCT CBC  2. GERD - POCT CBC  3. Atrial  fibrillation - POCT CBC  4. OSTEOPOROSIS - POCT CBC  5. hypothyroidism - Thyroid Panel With TSH  6. Vitamin D deficiency - Vit D  25 hydroxy (rtn osteoporosis monitoring)  7. history of pheochromocytoma - Cortisol Orders Placed This Encounter  Procedures  . Lipid panel  . Hepatic function panel  . Brain natriuretic peptide  . Vit D  25 hydroxy (rtn osteoporosis monitoring)  . Thyroid Panel With TSH  . Cortisol  . POCT CBC   No orders of the defined types were placed in this encounter.   Patient Instructions  Continue current medications. Continue good therapeutic lifestyle changes which include good diet and exercise. Fall precautions discussed with patient. Schedule your flu vaccine if you haven't had it yet If you are over 40 years old - you may need Prevnar 13 or the adult Pneumonia vaccine. You really do need to get a DEXA scan he would appear not going to have her receive any treatment for osteoporosis Continue followup with specialist as you are doing We will call you with the results of the lab work once it is available   Nyra Capes MD

## 2013-10-21 NOTE — Addendum Note (Signed)
Addended by: Orma Render F on: 10/21/2013 11:54 AM   Modules accepted: Orders

## 2013-10-21 NOTE — Patient Instructions (Addendum)
Continue current medications. Continue good therapeutic lifestyle changes which include good diet and exercise. Fall precautions discussed with patient. Schedule your flu vaccine if you haven't had it yet If you are over 67 years old - you may need Prevnar 13 or the adult Pneumonia vaccine. You really do need to get a DEXA scan he would appear not going to have her receive any treatment for osteoporosis Continue followup with specialist as you are doing We will call you with the results of the lab work once it is available

## 2013-10-21 NOTE — Addendum Note (Signed)
Addended by: Orma Render F on: 10/21/2013 11:41 AM   Modules accepted: Orders

## 2013-10-21 NOTE — Addendum Note (Signed)
Addended by: Magdalene River on: 10/21/2013 10:59 AM   Modules accepted: Orders

## 2013-10-22 LAB — BMP8+EGFR
BUN/Creatinine Ratio: 26 (ref 11–26)
BUN: 15 mg/dL (ref 8–27)
CO2: 25 mmol/L (ref 18–29)
Calcium: 10 mg/dL (ref 8.6–10.2)
Chloride: 100 mmol/L (ref 97–108)
Creatinine, Ser: 0.57 mg/dL (ref 0.57–1.00)
Glucose: 88 mg/dL (ref 65–99)

## 2013-10-22 LAB — LIPID PANEL
Chol/HDL Ratio: 1.7 ratio units (ref 0.0–4.4)
Cholesterol, Total: 182 mg/dL (ref 100–199)
HDL: 105 mg/dL (ref >39)
LDL Calculated: 69 mg/dL (ref 0–99)
Triglycerides: 41 mg/dL (ref 0–149)
VLDL Cholesterol Cal: 8 mg/dL (ref 5–40)

## 2013-10-22 LAB — HEPATIC FUNCTION PANEL
AST: 23 IU/L (ref 0–40)
Albumin: 4.4 g/dL (ref 3.6–4.8)
Alkaline Phosphatase: 93 IU/L (ref 39–117)
Bilirubin, Direct: 0.12 mg/dL (ref 0.00–0.40)
Total Bilirubin: 0.4 mg/dL (ref 0.0–1.2)
Total Protein: 6.9 g/dL (ref 6.0–8.5)

## 2013-10-22 LAB — THYROID PANEL WITH TSH
Free Thyroxine Index: 3.2 (ref 1.2–4.9)
T4, Total: 10.1 ug/dL (ref 4.5–12.0)

## 2013-11-11 ENCOUNTER — Other Ambulatory Visit: Payer: Medicare Other

## 2013-11-11 ENCOUNTER — Ambulatory Visit: Payer: Medicare Other

## 2013-12-23 ENCOUNTER — Other Ambulatory Visit: Payer: Medicare Other

## 2013-12-23 ENCOUNTER — Ambulatory Visit: Payer: Medicare Other

## 2014-01-06 ENCOUNTER — Other Ambulatory Visit: Payer: Medicare Other

## 2014-01-06 ENCOUNTER — Ambulatory Visit: Payer: Medicare Other

## 2014-02-03 ENCOUNTER — Ambulatory Visit (INDEPENDENT_AMBULATORY_CARE_PROVIDER_SITE_OTHER): Payer: Medicare Other

## 2014-02-03 ENCOUNTER — Ambulatory Visit (INDEPENDENT_AMBULATORY_CARE_PROVIDER_SITE_OTHER): Payer: Medicare Other | Admitting: Pharmacist

## 2014-02-03 ENCOUNTER — Encounter: Payer: Self-pay | Admitting: Pharmacist

## 2014-02-03 VITALS — Ht 66.0 in | Wt 150.0 lb

## 2014-02-03 DIAGNOSIS — M810 Age-related osteoporosis without current pathological fracture: Secondary | ICD-10-CM

## 2014-02-03 DIAGNOSIS — E559 Vitamin D deficiency, unspecified: Secondary | ICD-10-CM

## 2014-02-03 DIAGNOSIS — Z780 Asymptomatic menopausal state: Secondary | ICD-10-CM

## 2014-02-03 NOTE — Patient Instructions (Signed)

## 2014-02-03 NOTE — Progress Notes (Signed)
Patient ID: [name], female   DOB: Apr 15, 1946, 68 y.o.   MRN: [id number]  Osteoporosis [facility] Current Height: Height: 5\' 6"  (167.6 cm)      Max Lifetime Height: 5\' 6"  Current Weight: Weight: 150 lb (68.04 kg)       Ethnicity:Caucasian    HPI: Patient with diagnosis of osteoporosis.  She has refused treatment for osteoporosis in the past Back Pain?  No       Kyphosis?  No Prior fracture?  No Med(s) for Osteoporosis/Osteopenia:  none Med(s) previously tried for Osteoporosis/Osteopenia:  Fosamax and actonel - caused GI distress                                                             PMH: Age at menopause:  66's Hysterectomy?  No Oophorectomy?  No HRT? No Steroid Use?  No Thyroid med?  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  No Current or previous eating disorders?  No Last Vitamin D Result:  43.9 (10/21/2013) Last GFR Result:  96 (10/21/2013)   FH/SH: Family history of osteoporosis?  Yes   Parent with history of hip fracture?  No Family history of breast cancer?  No Exercise?  Yes - walking Smoking?  No Alcohol?  No    Calcium Assessment Calcium Intake  # of servings/day  Calcium mg  Milk (8 oz) 1  x  300  = 300mg   Yogurt (4 oz) 0 x  200 = 0  Cheese (1 oz) 1 x  200 = 200mg   Other Calcium sources   250mg   Ca supplement mvi = 400mg    Estimated calcium intake per day 1150mg     DEXA Results Date of Test T-Score for AP Spine L1-L4 T-Score for Total Left Hip T-Score for Total Right Hip  02/03/2014 -3.0 -2.5 -2.4  08/16/2010 -2.8 -2.3 -2.3  06/02/2008 -2.3 -2.1 -2.5  05/27/2006 -2.1 -2.1 -2.0    Assessment: osteoporosis  Recommendations: 1.  Patient refused medication to treat osteoporosis.  High risk of fractures with her current diagnosis of osteoporosis was explained and patient understands. 2.  recommend calcium 1200mg  daily through supplementation or diet.  3.  continue weight bearing  exercise - 30 minutes at least 4 days per week.   4.  Counseled and educated about fall risk and prevention.  Recheck DEXA:  2 years  Time spent counseling patient:  30 minutes  Cherre Robins, PharmD, CPP

## 2014-02-17 ENCOUNTER — Encounter: Payer: Self-pay | Admitting: Family Medicine

## 2014-02-17 ENCOUNTER — Ambulatory Visit (INDEPENDENT_AMBULATORY_CARE_PROVIDER_SITE_OTHER): Payer: Medicare Other | Admitting: Family Medicine

## 2014-02-17 VITALS — BP 110/66 | HR 75 | Temp 96.7°F | Ht 66.0 in | Wt 151.0 lb

## 2014-02-17 DIAGNOSIS — I10 Essential (primary) hypertension: Secondary | ICD-10-CM

## 2014-02-17 DIAGNOSIS — I498 Other specified cardiac arrhythmias: Secondary | ICD-10-CM

## 2014-02-17 DIAGNOSIS — E559 Vitamin D deficiency, unspecified: Secondary | ICD-10-CM

## 2014-02-17 DIAGNOSIS — R5381 Other malaise: Secondary | ICD-10-CM

## 2014-02-17 DIAGNOSIS — R6889 Other general symptoms and signs: Secondary | ICD-10-CM

## 2014-02-17 DIAGNOSIS — Z862 Personal history of diseases of the blood and blood-forming organs and certain disorders involving the immune mechanism: Secondary | ICD-10-CM

## 2014-02-17 DIAGNOSIS — I471 Supraventricular tachycardia: Secondary | ICD-10-CM

## 2014-02-17 DIAGNOSIS — K219 Gastro-esophageal reflux disease without esophagitis: Secondary | ICD-10-CM

## 2014-02-17 DIAGNOSIS — Z8639 Personal history of other endocrine, nutritional and metabolic disease: Secondary | ICD-10-CM

## 2014-02-17 DIAGNOSIS — R5383 Other fatigue: Secondary | ICD-10-CM

## 2014-02-17 DIAGNOSIS — E039 Hypothyroidism, unspecified: Secondary | ICD-10-CM

## 2014-02-17 DIAGNOSIS — Z86018 Personal history of other benign neoplasm: Secondary | ICD-10-CM

## 2014-02-17 DIAGNOSIS — I4891 Unspecified atrial fibrillation: Secondary | ICD-10-CM

## 2014-02-17 LAB — POCT CBC
GRANULOCYTE PERCENT: 64.5 % (ref 37–80)
HEMATOCRIT: 37.5 % — AB (ref 37.7–47.9)
Hemoglobin: 11.9 g/dL — AB (ref 12.2–16.2)
Lymph, poc: 1.6 (ref 0.6–3.4)
MCH, POC: 29.7 pg (ref 27–31.2)
MCHC: 31.7 g/dL — AB (ref 31.8–35.4)
MCV: 93.7 fL (ref 80–97)
MPV: 6.6 fL (ref 0–99.8)
POC Granulocyte: 3.9 (ref 2–6.9)
POC LYMPH PERCENT: 27.2 %L (ref 10–50)
Platelet Count, POC: 409 10*3/uL (ref 142–424)
RBC: 4 M/uL — AB (ref 4.04–5.48)
RDW, POC: 14.3 %
WBC: 6 10*3/uL (ref 4.6–10.2)

## 2014-02-17 NOTE — Progress Notes (Signed)
Subjective:    Patient ID: [name], female    DOB: December 28, 1945, 68 y.o.   MRN: [id number]  HPI Pt here for follow up and management of chronic medical problems. Patient has a problem with feeling cold all the time. The patient is alert and active and still works one half day a week. She indicates that she has had no further problems with reflux or esophagitis for 2 years or more. She is on a high-risk medication for a history of atrial fibrillation and supraventricular tachycardia.       Patient Active Problem List   Diagnosis Date Noted  . Vitamin D deficiency 10/21/2013  . 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 hypothyroidism 10/21/2013  . Hx of thyroid cancer 06/18/2013  . High risk medication use 06/18/2013  . Sinus tachycardia 07/06/2012  . Hypokalemia 07/06/2012  . Pheochromocytoma, history of 05/21/2012  . Hypertension 04/10/2010  . Atrial fibrillation 04/04/2010  . MITRAL VALVE PROLAPSE 03/28/2010  . GERD 03/28/2010  . 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 Artificial Tear Ointment (REFRESH LACRI-LUBE) OINT Apply 1 inch to eye at bedtime. Apply to bottom lid  . aspirin 325 MG EC tablet Take 325 mg by mouth daily.   . carboxymethylcellulose (REFRESH PLUS) 0.5 % SOLN Place 1 drop into both eyes 2 (two) times daily.  . Cholecalciferol (VITAMIN D3) 2000 UNITS TABS Take 1.5 capsules by mouth daily.   . flecainide (TAMBOCOR) 50 MG tablet TAKE 1 TABLET (50 MG TOTAL) BY MOUTH 2 (TWO) TIMES DAILY.  . Multiple Vitamins-Minerals (CENTRUM SILVER) tablet Take 1 tablet by mouth every morning.   . Multiple Vitamins-Minerals (PRESERVISION/LUTEIN PO) Take 1 capsule by mouth daily.  . Omega-3 Fatty Acids (FISH OIL) 1200 MG CAPS Take 1 capsule by mouth daily.  Marland Kitchen SYNTHROID 137 MCG tablet TAKE ONE TABLET BY MOUTH EVERY DAY  . loratadine (CLARITIN) 10 MG tablet Take 10 mg by mouth daily as needed.      Review of Systems  Constitutional: Negative.  Chills: cold "all the time"  HENT: Negative.   Eyes: Negative.   Respiratory: Negative.   Cardiovascular: Negative.   Gastrointestinal: Negative.   Endocrine: Negative.   Genitourinary: Negative.   Musculoskeletal: Negative.   Skin: Negative.   Allergic/Immunologic: Negative.   Neurological: Negative.   Hematological: Negative.   Psychiatric/Behavioral: Negative.        Objective:   Physical Exam  Nursing note and vitals reviewed. Constitutional: She is oriented to person, place, and time. She appears well-developed and well-nourished. No distress.  HENT:  Head: Normocephalic and atraumatic.  Right Ear: External ear normal.  Left Ear: External ear normal.  Mouth/Throat: Oropharynx is clear and moist.  Nasal congestion bilaterally  Eyes: Conjunctivae and EOM are normal. Pupils are equal, round, and reactive to light. Right eye exhibits no discharge. Left eye exhibits no discharge. No scleral icterus.  Neck: Normal range of motion. Neck supple. No thyromegaly present.  No thyroid masses or adenopathy  Cardiovascular: Normal rate, regular rhythm, normal heart sounds and intact distal pulses.   No murmur heard. At 84 per minute  Pulmonary/Chest: Effort normal and breath sounds normal. No respiratory distress. She has no wheezes. She has no rales.  Abdominal: Soft. Bowel sounds are normal. She exhibits no mass. There is no tenderness. There is no rebound and no guarding.  Musculoskeletal: Normal range of motion. She exhibits no edema and no tenderness.  Lymphadenopathy:  She has no cervical adenopathy.  Neurological: She is alert and oriented to person, place, and time. She has normal reflexes. No cranial nerve deficit.  Skin: Skin is warm and dry.  Psychiatric: She has a normal mood and affect. Her behavior is normal. Judgment and thought content normal.   BP 110/66  Pulse 75  Temp(Src) 96.7 F (35.9 C) (Oral)  Ht _0   (1.676 m)  Wt 151 lb (68.493 kg)  BMI 24.38 kg/m2        Assessment & Plan:  1. Atrial fibrillation - POCT CBC - NMR, lipoprofile -This is stable  2. GERD - POCT CBC -This is resolved  3. Hypertension - POCT CBC - BMP8+EGFR - Hepatic function panel - NMR, lipoprofile  4. hypothyroidism - POCT CBC - Thyroid Panel With TSH  5. Vitamin D deficiency - POCT CBC - Vit D  25 hydroxy (rtn osteoporosis monitoring)  6. SVT (supraventricular tachycardia), history of  7. Fatigue  8. History of pheochromocytoma  9. Cold intolerance  No orders of the defined types were placed in this encounter.   Patient Instructions                       Medicare Annual Wellness Visit  [facility] and the medical providers at [facility] strive to bring you the best medical care.  In doing so we not only want to address your current medical conditions and concerns but also to detect new conditions early and prevent illness, disease and health-related problems.    Medicare offers a yearly Wellness Visit which allows our clinical staff to assess your need for preventative services including immunizations, lifestyle education, counseling to decrease risk of preventable diseases and screening for fall risk and other medical concerns.    This visit is provided free of charge (no copay) for all Medicare recipients. The clinical pharmacists at [facility] have begun to conduct these Wellness Visits which will also include a thorough review of all your medications.    As you primary medical provider recommend that you make an appointment for your Annual Wellness Visit if you have not done so already this year.  You may set up this appointment before you leave today or you may call back ([phone]) and schedule an appointment.  Please make sure when you call that you mention that you are scheduling your Annual Wellness Visit with the clinical pharmacist so  that the appointment may be made for the proper length of time.       Continue current medications. Continue good therapeutic lifestyle changes which include good diet and exercise. Fall precautions discussed with patient. If an FOBT was given today- please return it to our front desk. If you are over 25 years old - you may need Prevnar 93 or the adult Pneumonia vaccine.  Continue medications as directed We will call you with the results of the lab work once those results are available   Arrie Senate MD

## 2014-02-17 NOTE — Patient Instructions (Addendum)
Medicare Annual Wellness Visit  [facility] and the medical providers at [facility] strive to bring you the best medical care.  In doing so we not only want to address your current medical conditions and concerns but also to detect new conditions early and prevent illness, disease and health-related problems.    Medicare offers a yearly Wellness Visit which allows our clinical staff to assess your need for preventative services including immunizations, lifestyle education, counseling to decrease risk of preventable diseases and screening for fall risk and other medical concerns.    This visit is provided free of charge (no copay) for all Medicare recipients. The clinical pharmacists at [facility] have begun to conduct these Wellness Visits which will also include a thorough review of all your medications.    As you primary medical provider recommend that you make an appointment for your Annual Wellness Visit if you have not done so already this year.  You may set up this appointment before you leave today or you may call back [phone]) and schedule an appointment.  Please make sure when you call that you mention that you are scheduling your Annual Wellness Visit with the clinical pharmacist so that the appointment may be made for the proper length of time.       Continue current medications. Continue good therapeutic lifestyle changes which include good diet and exercise. Fall precautions discussed with patient. If an FOBT was given today- please return it to our front desk. If you are over 65 years old - you may need Prevnar 65 or the adult Pneumonia vaccine.  Continue medications as directed We will call you with the results of the lab work once those results are available

## 2014-02-18 LAB — IRON AND TIBC
IRON SATURATION: 20 % (ref 15–55)
IRON: 68 ug/dL (ref 35–155)
TIBC: 342 ug/dL (ref 250–450)
UIBC: 274 ug/dL (ref 150–375)

## 2014-02-18 LAB — THYROID PANEL WITH TSH
Free Thyroxine Index: 3.6 (ref 1.2–4.9)
T3 Uptake Ratio: 34 % (ref 24–39)
T4, Total: 10.6 ug/dL (ref 4.5–12.0)
TSH: 0.011 u[IU]/mL — ABNORMAL LOW (ref 0.450–4.500)

## 2014-02-18 LAB — HEPATIC FUNCTION PANEL
ALBUMIN: 4.3 g/dL (ref 3.6–4.8)
ALK PHOS: 84 IU/L (ref 39–117)
ALT: 18 IU/L (ref 0–32)
AST: 24 IU/L (ref 0–40)
BILIRUBIN DIRECT: 0.15 mg/dL (ref 0.00–0.40)
BILIRUBIN TOTAL: 0.6 mg/dL (ref 0.0–1.2)
Total Protein: 6.8 g/dL (ref 6.0–8.5)

## 2014-02-18 LAB — BMP8+EGFR
BUN / CREAT RATIO: 23 (ref 11–26)
BUN: 13 mg/dL (ref 8–27)
CHLORIDE: 102 mmol/L (ref 97–108)
CO2: 28 mmol/L (ref 18–29)
Calcium: 9.6 mg/dL (ref 8.7–10.3)
Creatinine, Ser: 0.56 mg/dL — ABNORMAL LOW (ref 0.57–1.00)
GFR calc non Af Amer: 97 mL/min/{1.73_m2} (ref >59)
GFR, EST AFRICAN AMERICAN: 112 mL/min/{1.73_m2} (ref >59)
Glucose: 87 mg/dL (ref 65–99)
Potassium: 4.2 mmol/L (ref 3.5–5.2)
Sodium: 142 mmol/L (ref 134–144)

## 2014-02-18 LAB — FERRITIN: Ferritin: 55 ng/mL (ref 15–150)

## 2014-02-18 LAB — NMR, LIPOPROFILE
Cholesterol: 173 mg/dL (ref <200)
HDL Cholesterol by NMR: 100 mg/dL (ref >=40)
HDL PARTICLE NUMBER: 37 umol/L (ref >=30.5)
LDL Particle Number: 607 nmol/L (ref <1000)
LDL Size: 21.2 nm (ref >20.5)
LDLC SERPL CALC-MCNC: 65 mg/dL (ref <100)
Triglycerides by NMR: 39 mg/dL (ref <150)

## 2014-02-18 LAB — VITAMIN D 25 HYDROXY (VIT D DEFICIENCY, FRACTURES): Vit D, 25-Hydroxy: 45.7 ng/mL (ref 30.0–100.0)

## 2014-02-18 LAB — SPECIMEN STATUS REPORT

## 2014-02-23 ENCOUNTER — Other Ambulatory Visit: Payer: Self-pay | Admitting: *Deleted

## 2014-02-23 MED ORDER — SYNTHROID 150 MCG PO TABS: 150.0000 ug | ORAL_TABLET | ORAL | Status: DC

## 2014-03-23 ENCOUNTER — Other Ambulatory Visit: Payer: Self-pay | Admitting: *Deleted

## 2014-03-23 MED ORDER — FLECAINIDE ACETATE 50 MG PO TABS: ORAL_TABLET | ORAL | Status: DC

## 2014-06-13 IMAGING — CR DG CHEST 2V
2 series · 2 of 2 positions shown · non-contrast
Comparison: 04/23/2011.  04/26/2011.

CLINICAL DATA: History of adrenal neoplasm.  History of thyroid
carcinoma.  History of hypertension.

CHEST - 2 VIEW

[w chest pa]
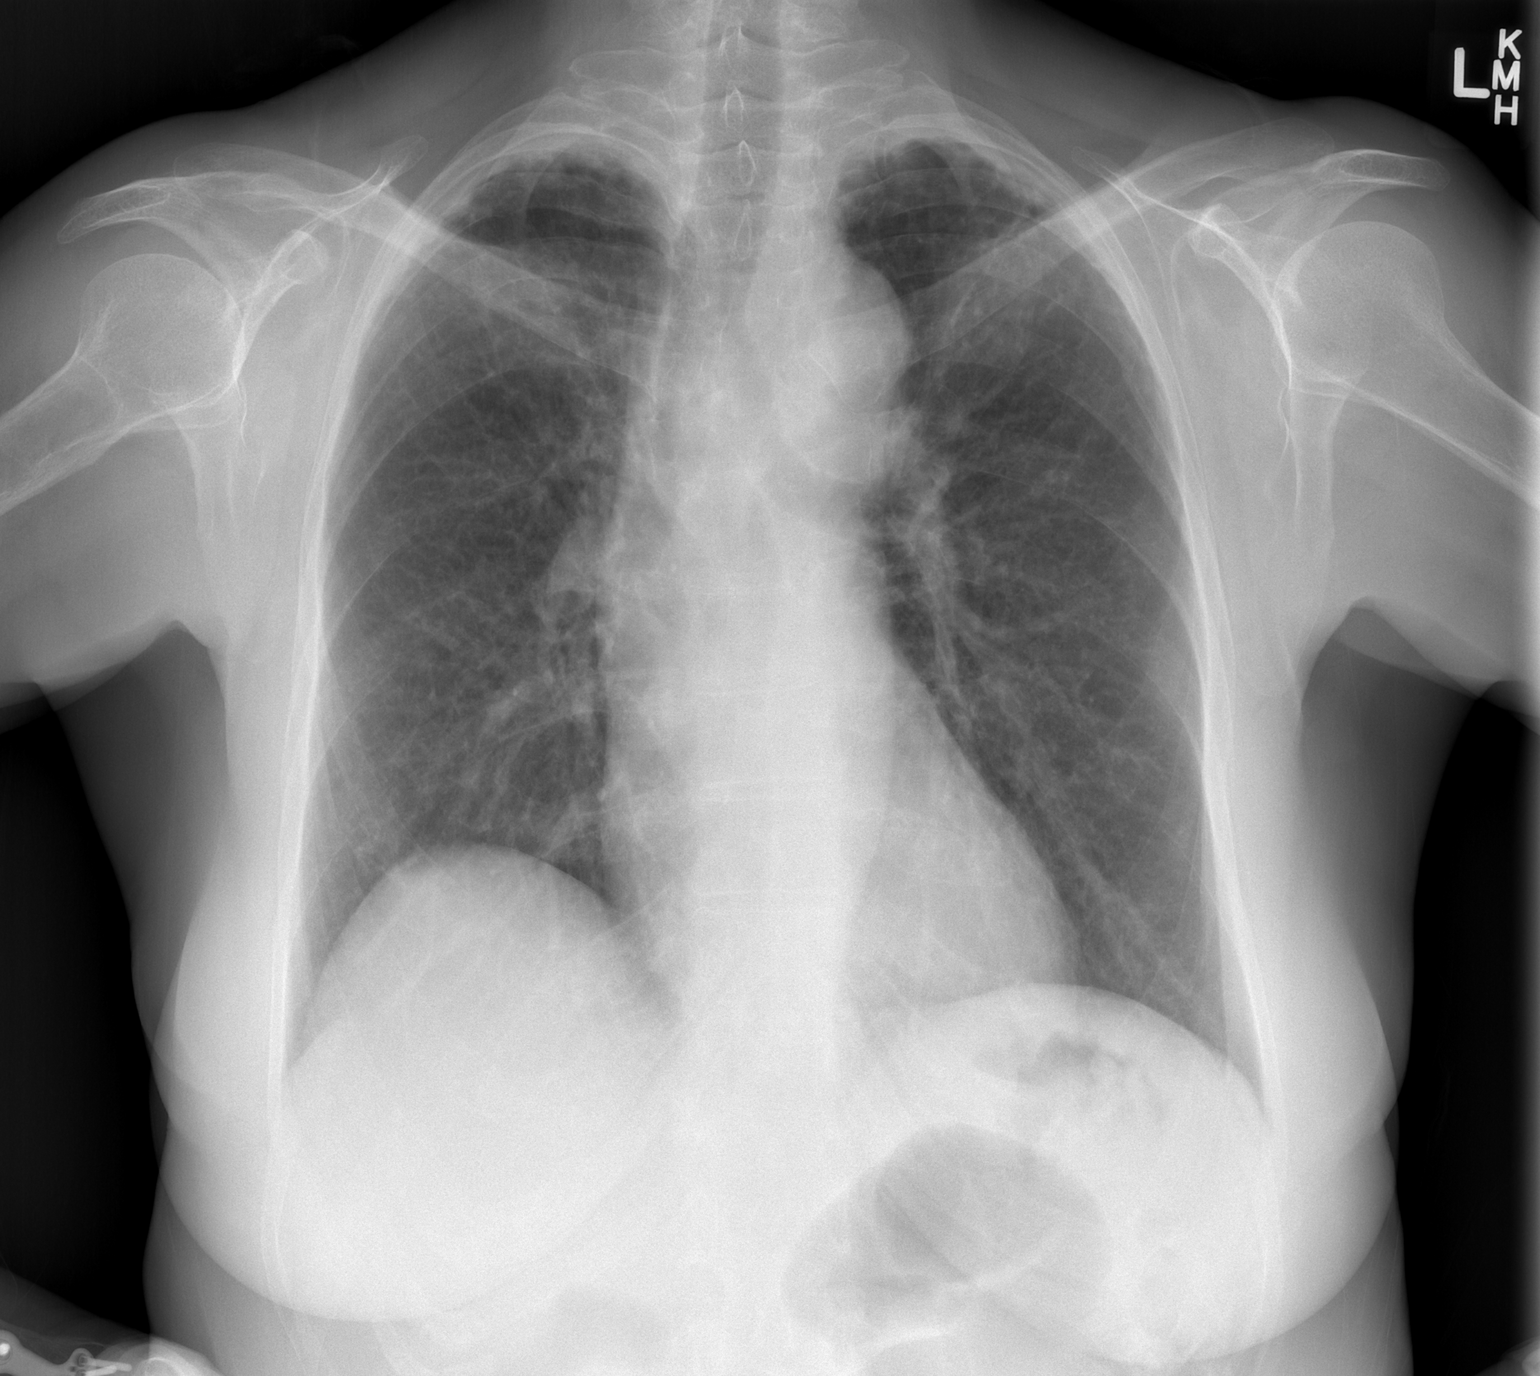

[w chest lat]
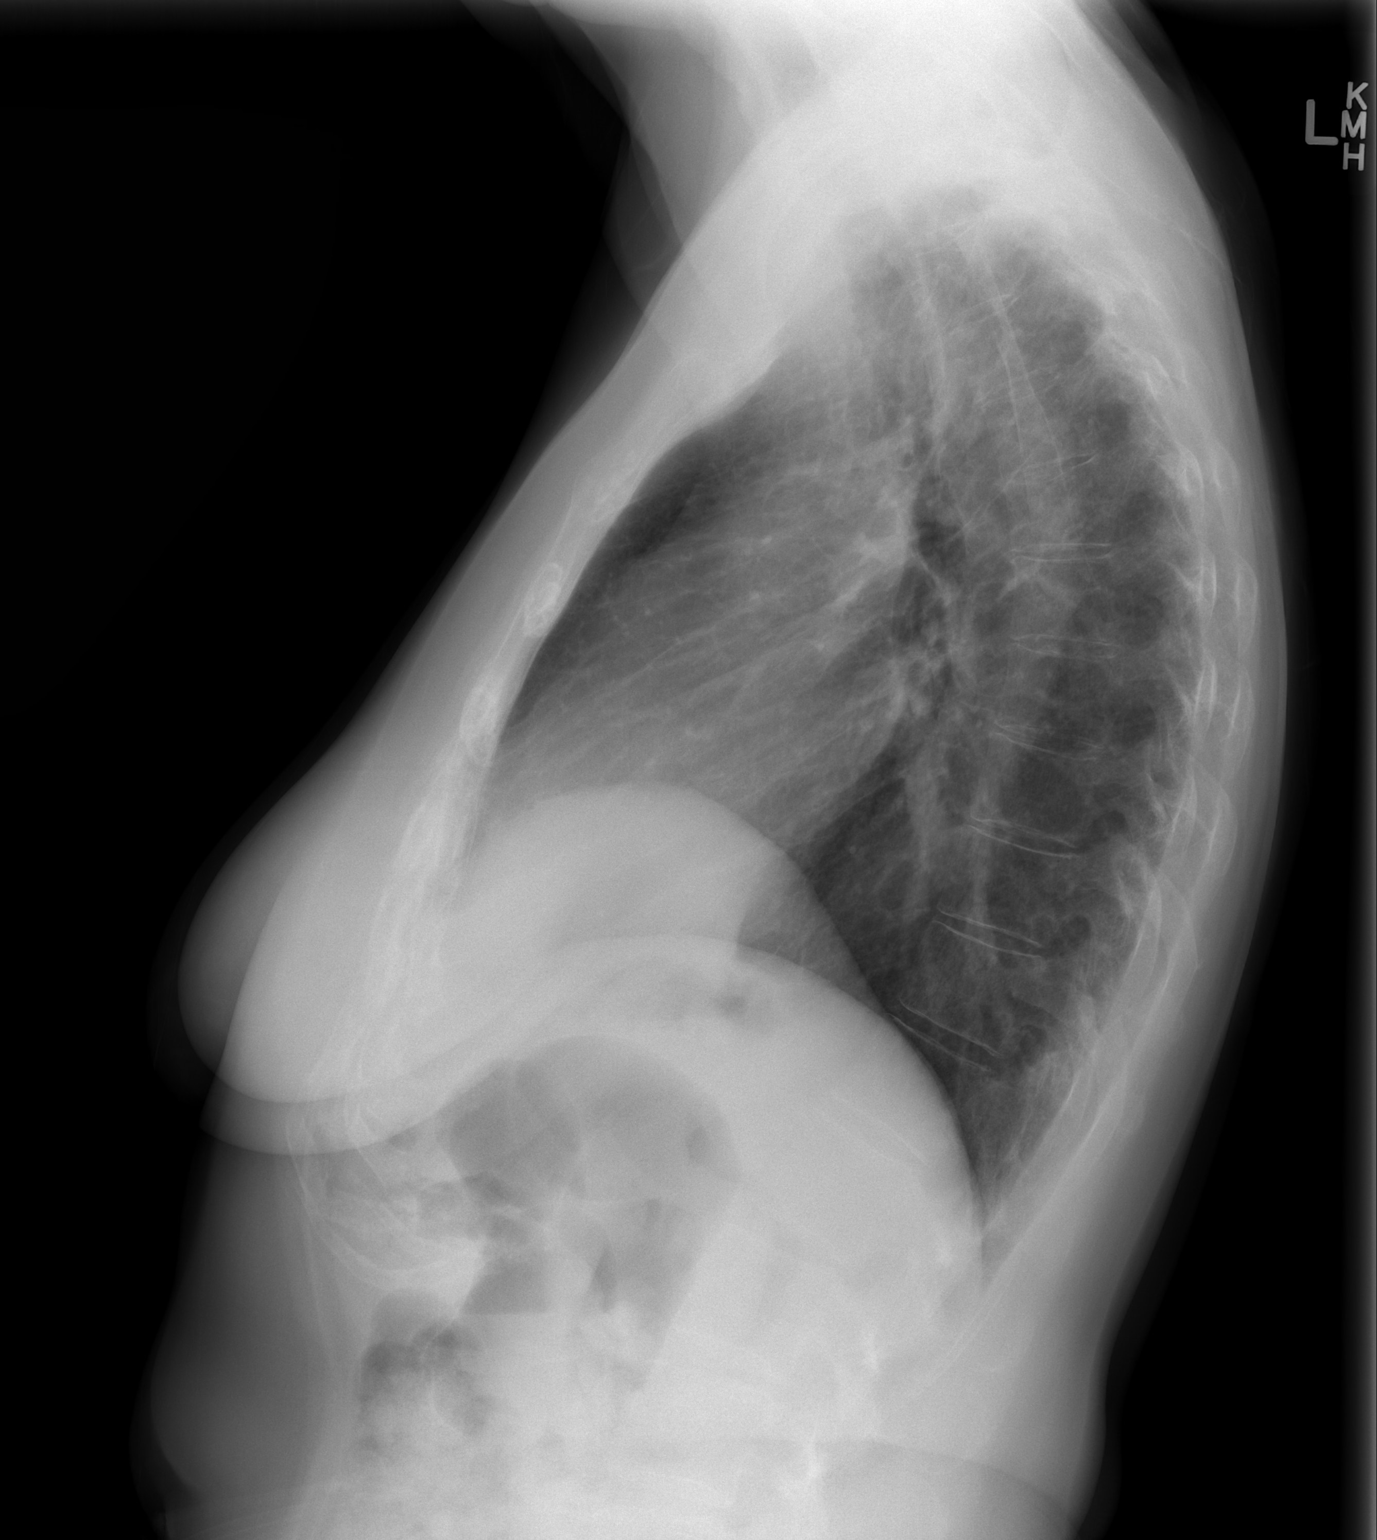

[2 of 2 positions shown; findings below may reference images not displayed]

FINDINGS: Cardiac silhouette is normal size and shape.  Ectasia of
thoracic aorta is seen.  Mediastinal and hilar contours appear
stable.  There is chronic elevation of the anterior aspect of the
right hemidiaphragm.  Bilateral apical noncalcific pleural
thickening with associated pulmonary fibrotic changes appear
stable.  No acute pulmonary infiltrates or nodules were evident.
Slight chronic increase in reticular interstitial markings appears
stable.  No pleural effusions are evident.  There is osteopenic
appearance of bones.  There is minimal degenerative spondylosis.
IMPRESSION: No acute or active cardiopulmonary or pleural abnormalities are
evident.  Stable chronic findings are detailed above.

## 2014-06-24 ENCOUNTER — Encounter: Payer: Self-pay | Admitting: Family Medicine

## 2014-06-24 ENCOUNTER — Ambulatory Visit (INDEPENDENT_AMBULATORY_CARE_PROVIDER_SITE_OTHER): Payer: Medicare Other | Admitting: Family Medicine

## 2014-06-24 VITALS — BP 111/64 | HR 83 | Temp 97.2°F | Ht 66.0 in | Wt 149.0 lb

## 2014-06-24 DIAGNOSIS — I4891 Unspecified atrial fibrillation: Secondary | ICD-10-CM

## 2014-06-24 DIAGNOSIS — D3500 Benign neoplasm of unspecified adrenal gland: Secondary | ICD-10-CM

## 2014-06-24 DIAGNOSIS — Z85850 Personal history of malignant neoplasm of thyroid: Secondary | ICD-10-CM

## 2014-06-24 DIAGNOSIS — I10 Essential (primary) hypertension: Secondary | ICD-10-CM

## 2014-06-24 DIAGNOSIS — Z79899 Other long term (current) drug therapy: Secondary | ICD-10-CM

## 2014-06-24 DIAGNOSIS — J069 Acute upper respiratory infection, unspecified: Secondary | ICD-10-CM

## 2014-06-24 DIAGNOSIS — E559 Vitamin D deficiency, unspecified: Secondary | ICD-10-CM

## 2014-06-24 DIAGNOSIS — E039 Hypothyroidism, unspecified: Secondary | ICD-10-CM

## 2014-06-24 LAB — POCT CBC
GRANULOCYTE PERCENT: 69.9 % (ref 37–80)
HEMATOCRIT: 40 % (ref 37.7–47.9)
Hemoglobin: 13.5 g/dL (ref 12.2–16.2)
LYMPH, POC: 1.6 (ref 0.6–3.4)
MCH, POC: 29.7 pg (ref 27–31.2)
MCHC: 33.7 g/dL (ref 31.8–35.4)
MCV: 88.2 fL (ref 80–97)
MPV: 7.7 fL (ref 0–99.8)
PLATELET COUNT, POC: 179 10*3/uL (ref 142–424)
POC Granulocyte: 4.1 (ref 2–6.9)
POC LYMPH PERCENT: 26.8 %L (ref 10–50)
RBC: 4.5 M/uL (ref 4.04–5.48)
RDW, POC: 13 %
WBC: 5.9 10*3/uL (ref 4.6–10.2)

## 2014-06-24 NOTE — Progress Notes (Signed)
Subjective:    Patient ID: [name], female    DOB: 03-May-1946, 68 y.o.   MRN: [id number]  HPI Pt here for follow up and management of chronic medical problems. The patient is doing well. She is followed periodically by the cardiologist and the endocrinologist. She is due to get lab work today. She has an FOBT which she has not returned at her and she will be encouraged to do this. She does not need any refills on her medication. The patient will call the endocrinologist herself to make an appointment. She'll see the cardiologist in October.        Patient Active Problem List   Diagnosis Date Noted  . History of pheochromocytoma 02/17/2014  . Cold intolerance 02/17/2014  . Vitamin D deficiency 10/21/2013  . 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 hypothyroidism 10/21/2013  . Hx of thyroid cancer 06/18/2013  . High risk medication use 06/18/2013  . Sinus tachycardia 07/06/2012  . Hypokalemia 07/06/2012  . Pheochromocytoma, history of 05/21/2012  . Hypertension 04/10/2010  . Atrial fibrillation 04/04/2010  . MITRAL VALVE PROLAPSE 03/28/2010  . 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 Artificial Tear Ointment (REFRESH LACRI-LUBE) OINT Apply 1 inch to eye at bedtime. Apply to bottom lid  . aspirin 325 MG EC tablet Take 325 mg by mouth daily.   . Cholecalciferol (VITAMIN D3) 2000 UNITS TABS Take 1.5 capsules by mouth daily.   . flecainide (TAMBOCOR) 50 MG tablet TAKE 1 TABLET (50 MG TOTAL) BY MOUTH 2 (TWO) TIMES DAILY.  Marland Kitchen loratadine (CLARITIN) 10 MG tablet Take 10 mg by mouth daily as needed.   . Multiple Vitamins-Minerals (CENTRUM SILVER) tablet Take 1 tablet by mouth every morning.   . Multiple Vitamins-Minerals (PRESERVISION/LUTEIN PO) Take 1 capsule by mouth daily.  . Omega-3 Fatty Acids (FISH OIL) 1200 MG CAPS Take 1 capsule by mouth daily.  Marland Kitchen SYNTHROID 150 MCG tablet Take 1 tablet (150 mcg  total) by mouth as directed.  . [DISCONTINUED] carboxymethylcellulose (REFRESH PLUS) 0.5 % SOLN Place 1 drop into both eyes 2 (two) times daily.    Review of Systems  Constitutional: Negative.   HENT: Negative.   Eyes: Negative.   Respiratory: Negative.   Cardiovascular: Negative.   Gastrointestinal: Negative.   Endocrine: Negative.   Genitourinary: Negative.   Musculoskeletal: Negative.   Skin: Negative.   Allergic/Immunologic: Negative.   Neurological: Negative.   Hematological: Negative.   Psychiatric/Behavioral: Negative.        Objective:   Physical Exam  Nursing note and vitals reviewed. Constitutional: She is oriented to person, place, and time. She appears well-developed and well-nourished.  Patient is alert. She has a thin body habitus.  HENT:  Head: Normocephalic and atraumatic.  Right Ear: External ear normal.  Left Ear: External ear normal.  Nose: Nose normal.  Mouth/Throat: Oropharyngeal exudate present.  She has yellow drainage in the posterior throat area  Eyes: Conjunctivae and EOM are normal. Pupils are equal, round, and reactive to light. Right eye exhibits no discharge. Left eye exhibits no discharge. No scleral icterus.  Neck: Normal range of motion. Neck supple. No JVD present. No thyromegaly present.  No anterior cervical nodes  Cardiovascular: Normal rate, regular rhythm, normal heart sounds and intact distal pulses.  Exam reveals no gallop and no friction rub.   No murmur heard. At 84 per minute, the patient has a significant amount of varicose veins in both  lower extremities  Pulmonary/Chest: Effort normal and breath sounds normal. No respiratory distress. She has no wheezes. She has no rales. She exhibits no tenderness.  Abdominal: Soft. Bowel sounds are normal. She exhibits no mass. There is no tenderness. There is no rebound and no guarding.  Musculoskeletal: Normal range of motion. She exhibits no edema and no tenderness.  Lymphadenopathy:     She has no cervical adenopathy.  Neurological: She is alert and oriented to person, place, and time. She has normal reflexes. No cranial nerve deficit.  Skin: Skin is warm and dry.  Psychiatric: She has a normal mood and affect. Her behavior is normal. Judgment and thought content normal.    BP 111/64  Pulse 83  Temp(Src) 97.2 F (36.2 C) (Oral)  Ht 5' 6"  (1.676 m)  Wt 149 lb (67.586 kg)  BMI 24.06 kg/m2       Assessment & Plan:  1. Hypertension - POCT CBC - BMP8+EGFR - Hepatic function panel - NMR, lipoprofile  2. Hx of thyroid cancer - POCT CBC  3. High risk medication use - POCT CBC  4. Atrial fibrillation, unspecified - POCT CBC  5. hypothyroidism - POCT CBC - Thyroid Panel With TSH  6. Vitamin D deficiency - POCT CBC - Vit D  25 hydroxy (rtn osteoporosis monitoring)  7. Pheochromocytoma, unspecified laterality  8. URI (upper respiratory infection)   Patient Instructions                       Medicare Annual Wellness Visit  [facility] and the medical providers at [facility] strive to bring you the best medical care.  In doing so we not only want to address your current medical conditions and concerns but also to detect new conditions early and prevent illness, disease and health-related problems.    Medicare offers a yearly Wellness Visit which allows our clinical staff to assess your need for preventative services including immunizations, lifestyle education, counseling to decrease risk of preventable diseases and screening for fall risk and other medical concerns.    This visit is provided free of charge (no copay) for all Medicare recipients. The clinical pharmacists at [facility] have begun to conduct these Wellness Visits which will also include a thorough review of all your medications.    As you primary medical provider recommend that you make an appointment for your Annual Wellness Visit if you have  not done so already this year.  You may set up this appointment before you leave today or you may call back ([phone]) and schedule an appointment.  Please make sure when you call that you mention that you are scheduling your Annual Wellness Visit with the clinical pharmacist so that the appointment may be made for the proper length of time.       Continue current medications. Continue good therapeutic lifestyle changes which include good diet and exercise. Fall precautions discussed with patient. If an FOBT was given today- please return it to our front desk. If you are over 82 years old - you may need Prevnar 76 or the adult Pneumonia vaccine.  Flu Shots will be available at our office starting mid- September. Please call and schedule a FLU CLINIC APPOINTMENT.   Keep followup appointments with endocrinology and cardiology Return the FOBT We will call you with your lab work is and as those results are available Use conservative treatment for throat drainage-like gargling with warm salty water and using  nasal saline Drink plenty of water If drainage progresses and or if cough develops, please call back and we will call in an antibiotic for this  Use plain Mucinex to help loosen congestion and cough   Arrie Senate MD

## 2014-06-24 NOTE — Patient Instructions (Addendum)
Medicare Annual Wellness Visit  [facility] and the medical providers at [facility] strive to bring you the best medical care.  In doing so we not only want to address your current medical conditions and concerns but also to detect new conditions early and prevent illness, disease and health-related problems.    Medicare offers a yearly Wellness Visit which allows our clinical staff to assess your need for preventative services including immunizations, lifestyle education, counseling to decrease risk of preventable diseases and screening for fall risk and other medical concerns.    This visit is provided free of charge (no copay) for all Medicare recipients. The clinical pharmacists at [facility] have begun to conduct these Wellness Visits which will also include a thorough review of all your medications.    As you primary medical provider recommend that you make an appointment for your Annual Wellness Visit if you have not done so already this year.  You may set up this appointment before you leave today or you may call back ([phone]) and schedule an appointment.  Please make sure when you call that you mention that you are scheduling your Annual Wellness Visit with the clinical pharmacist so that the appointment may be made for the proper length of time.       Continue current medications. Continue good therapeutic lifestyle changes which include good diet and exercise. Fall precautions discussed with patient. If an FOBT was given today- please return it to our front desk. If you are over 69 years old - you may need Prevnar 4 or the adult Pneumonia vaccine.  Flu Shots will be available at our office starting mid- September. Please call and schedule a FLU CLINIC APPOINTMENT.   Keep followup appointments with endocrinology and cardiology Return the FOBT We will call you with your lab work is and as those results are  available Use conservative treatment for throat drainage-like gargling with warm salty water and using nasal saline Drink plenty of water If drainage progresses and or if cough develops, please call back and we will call in an antibiotic for this  Use plain Mucinex to help loosen congestion and cough

## 2014-06-25 LAB — NMR, LIPOPROFILE
CHOLESTEROL: 178 mg/dL (ref 100–199)
HDL CHOLESTEROL BY NMR: 108 mg/dL (ref >39)
HDL Particle Number: 38.8 umol/L (ref >=30.5)
LDL Particle Number: 512 nmol/L (ref <1000)
LDL Size: 21 nm (ref >20.5)
LDLC SERPL CALC-MCNC: 63 mg/dL (ref 0–99)
LP-IR Score: <25 (ref <=45)
TRIGLYCERIDES BY NMR: 36 mg/dL (ref 0–149)

## 2014-06-25 LAB — HEPATIC FUNCTION PANEL
ALT: 20 IU/L (ref 0–32)
AST: 24 IU/L (ref 0–40)
Albumin: 4.6 g/dL (ref 3.6–4.8)
Alkaline Phosphatase: 85 IU/L (ref 39–117)
Bilirubin, Direct: 0.2 mg/dL (ref 0.00–0.40)
TOTAL PROTEIN: 7.1 g/dL (ref 6.0–8.5)
Total Bilirubin: 0.8 mg/dL (ref 0.0–1.2)

## 2014-06-25 LAB — BMP8+EGFR
BUN/Creatinine Ratio: 24 (ref 11–26)
BUN: 15 mg/dL (ref 8–27)
CALCIUM: 10 mg/dL (ref 8.7–10.3)
CHLORIDE: 101 mmol/L (ref 97–108)
CO2: 25 mmol/L (ref 18–29)
Creatinine, Ser: 0.63 mg/dL (ref 0.57–1.00)
GFR calc Af Amer: 107 mL/min/{1.73_m2} (ref >59)
GFR calc non Af Amer: 92 mL/min/{1.73_m2} (ref >59)
GLUCOSE: 84 mg/dL (ref 65–99)
Potassium: 5 mmol/L (ref 3.5–5.2)
Sodium: 142 mmol/L (ref 134–144)

## 2014-06-25 LAB — VITAMIN D 25 HYDROXY (VIT D DEFICIENCY, FRACTURES): VIT D 25 HYDROXY: 43.7 ng/mL (ref 30.0–100.0)

## 2014-06-25 LAB — THYROID PANEL WITH TSH
FREE THYROXINE INDEX: 3.3 (ref 1.2–4.9)
T3 UPTAKE RATIO: 30 % (ref 24–39)
T4, Total: 11 ug/dL (ref 4.5–12.0)
TSH: 0.005 u[IU]/mL — ABNORMAL LOW (ref 0.450–4.500)

## 2014-07-20 ENCOUNTER — Other Ambulatory Visit: Payer: Self-pay

## 2014-07-20 MED ORDER — FLECAINIDE ACETATE 50 MG PO TABS: ORAL_TABLET | ORAL | Status: DC

## 2014-08-18 ENCOUNTER — Ambulatory Visit (INDEPENDENT_AMBULATORY_CARE_PROVIDER_SITE_OTHER): Payer: Medicare Other | Admitting: Cardiology

## 2014-08-18 ENCOUNTER — Encounter: Payer: Self-pay | Admitting: Cardiology

## 2014-08-18 VITALS — BP 100/58 | HR 81 | Ht 66.0 in | Wt 154.0 lb

## 2014-08-18 DIAGNOSIS — I10 Essential (primary) hypertension: Secondary | ICD-10-CM

## 2014-08-18 DIAGNOSIS — I4891 Unspecified atrial fibrillation: Secondary | ICD-10-CM

## 2014-08-18 DIAGNOSIS — I059 Rheumatic mitral valve disease, unspecified: Secondary | ICD-10-CM

## 2014-08-18 NOTE — Patient Instructions (Signed)
The current medical regimen is effective;  continue present plan and medications.  Follow up in 6 months with Dr. Percival Spanish in [place].  You will receive a letter in the mail 2 months before you are due.  Please call us when you receive this letter to schedule your follow up appointment.
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
## 2014-08-19 ENCOUNTER — Ambulatory Visit (INDEPENDENT_AMBULATORY_CARE_PROVIDER_SITE_OTHER): Payer: Medicare Other

## 2014-08-19 DIAGNOSIS — Z23 Encounter for immunization: Secondary | ICD-10-CM

## 2014-08-20 NOTE — Telephone Encounter (Signed)
Nothing was typed/tmj 

## 2014-09-09 ENCOUNTER — Encounter: Payer: Medicare Other | Admitting: Family Medicine

## 2014-09-27 ENCOUNTER — Ambulatory Visit (INDEPENDENT_AMBULATORY_CARE_PROVIDER_SITE_OTHER): Payer: Medicare Other | Admitting: Family Medicine

## 2014-09-27 ENCOUNTER — Encounter: Payer: Self-pay | Admitting: Family Medicine

## 2014-09-27 VITALS — BP 107/58 | HR 76 | Temp 96.7°F | Ht 66.0 in | Wt 153.0 lb

## 2014-09-27 DIAGNOSIS — I480 Paroxysmal atrial fibrillation: Secondary | ICD-10-CM

## 2014-09-27 DIAGNOSIS — Z85850 Personal history of malignant neoplasm of thyroid: Secondary | ICD-10-CM

## 2014-09-27 DIAGNOSIS — E559 Vitamin D deficiency, unspecified: Secondary | ICD-10-CM

## 2014-09-27 DIAGNOSIS — Z0000 Encounter for general adult medical examination without abnormal findings: Secondary | ICD-10-CM

## 2014-09-27 DIAGNOSIS — I10 Essential (primary) hypertension: Secondary | ICD-10-CM

## 2014-09-27 DIAGNOSIS — D3501 Benign neoplasm of right adrenal gland: Secondary | ICD-10-CM

## 2014-09-27 LAB — POCT UA - MICROSCOPIC ONLY
Casts, Ur, LPF, POC: NEGATIVE
Crystals, Ur, HPF, POC: NEGATIVE
MUCUS UA: NEGATIVE
RBC, URINE, MICROSCOPIC: NEGATIVE
Yeast, UA: NEGATIVE

## 2014-09-27 LAB — POCT URINALYSIS DIPSTICK
BILIRUBIN UA: NEGATIVE
Blood, UA: NEGATIVE
Glucose, UA: NEGATIVE
Ketones, UA: NEGATIVE
Leukocytes, UA: NEGATIVE
NITRITE UA: NEGATIVE
PH UA: 5
Protein, UA: NEGATIVE
Spec Grav, UA: 1.01
Urobilinogen, UA: NEGATIVE

## 2014-09-27 LAB — POCT CBC
GRANULOCYTE PERCENT: 60.6 % (ref 37–80)
HCT, POC: 41 % (ref 37.7–47.9)
Hemoglobin: 12.9 g/dL (ref 12.2–16.2)
Lymph, poc: 2.2 (ref 0.6–3.4)
MCH, POC: 27.7 pg (ref 27–31.2)
MCHC: 31.4 g/dL — AB (ref 31.8–35.4)
MCV: 88.1 fL (ref 80–97)
MPV: 8 fL (ref 0–99.8)
PLATELET COUNT, POC: 193 10*3/uL (ref 142–424)
POC GRANULOCYTE: 3.8 (ref 2–6.9)
POC LYMPH PERCENT: 34.7 %L (ref 10–50)
RBC: 4.7 M/uL (ref 4.04–5.48)
RDW, POC: 12.5 %
WBC: 6.2 10*3/uL (ref 4.6–10.2)

## 2014-09-27 NOTE — Progress Notes (Signed)
Subjective:    Patient ID: [name], female    DOB: 26-Feb-1946, 68 y.o.   MRN: [id number]  HPI Patient is here today for annual wellness exam and follow up of chronic medical problems. The patient's history is significant in that she has a history of thyroid cancer and thyroid removal. She has a history of atrial fibrillation and is on a high risk medication for this. She also has a history of pheochromocytoma. This has been removed. Her heart irregularities appear to be stable and she no longer has to see the nephrologist. She has an FOBT at home which she promises to return. She will get lab work today. She is up-to-date on her other health maintenance issues.          Patient Active Problem List   Diagnosis Date Noted  . History of pheochromocytoma 02/17/2014  . Cold intolerance 02/17/2014  . Vitamin D deficiency 10/21/2013  . 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 hypothyroidism 10/21/2013  . Hx of thyroid cancer 06/18/2013  . High risk medication use 06/18/2013  . Sinus tachycardia 07/06/2012  . Hypokalemia 07/06/2012  . Pheochromocytoma, history of 05/21/2012  . Hypertension 04/10/2010  . Atrial fibrillation 04/04/2010  . Mitral valve disorder 03/28/2010  . 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 Artificial Tear Ointment (REFRESH LACRI-LUBE) OINT Apply 1 inch to eye at bedtime. Apply to bottom lid  . aspirin 325 MG EC tablet Take 325 mg by mouth daily.   . Cholecalciferol (VITAMIN D3) 2000 UNITS TABS Take 1.5 capsules by mouth daily.   . flecainide (TAMBOCOR) 50 MG tablet TAKE 1 TABLET (50 MG TOTAL) BY MOUTH 2 (TWO) TIMES DAILY.  Marland Kitchen loratadine (CLARITIN) 10 MG tablet Take 10 mg by mouth daily as needed.   . Multiple Vitamins-Minerals (CENTRUM SILVER) tablet Take 1 tablet by mouth every morning.   . Multiple Vitamins-Minerals (PRESERVISION/LUTEIN PO) Take 1 capsule by mouth daily.  .  Omega-3 Fatty Acids (FISH OIL) 1200 MG CAPS Take 1 capsule by mouth daily.  Marland Kitchen SYNTHROID 150 MCG tablet Take 1 tablet (150 mcg total) by mouth as directed.    Review of Systems  Constitutional: Negative.   HENT: Negative.   Eyes: Negative.   Respiratory: Negative.   Cardiovascular: Negative.   Gastrointestinal: Negative.   Endocrine: Negative.   Genitourinary: Negative.   Musculoskeletal: Negative.   Skin: Negative.   Allergic/Immunologic: Negative.   Neurological: Negative.   Hematological: Negative.   Psychiatric/Behavioral: Negative.        Objective:   Physical Exam  Constitutional: She is oriented to person, place, and time. She appears well-developed and well-nourished. No distress.  Patient is pleasant, cooperative.  HENT:  Head: Normocephalic and atraumatic.  Right Ear: External ear normal.  Left Ear: External ear normal.  Nose: Nose normal.  Mouth/Throat: Oropharynx is clear and moist. No oropharyngeal exudate.  Eyes: Conjunctivae and EOM are normal. Pupils are equal, round, and reactive to light. Right eye exhibits no discharge. Left eye exhibits no discharge. No scleral icterus.  Neck: Normal range of motion. Neck supple. No thyromegaly present.  There is no thyroid enlargement or abnormal masses in the neck.  Cardiovascular: Normal rate, regular rhythm, normal heart sounds and intact distal pulses.   No murmur heard. The rhythm is regular at 84/m  Pulmonary/Chest: Effort normal and breath sounds normal. No respiratory distress. She has no wheezes. She has no rales. She exhibits no  tenderness.  Abdominal: Soft. She exhibits no mass. There is no tenderness. There is no rebound and no guarding.  Bowel sounds were quiet  Musculoskeletal: Normal range of motion. She exhibits no edema or tenderness.  Lymphadenopathy:    She has no cervical adenopathy.  Neurological: She is alert and oriented to person, place, and time. She has normal reflexes. No cranial nerve  deficit.  Skin: Skin is warm and dry. No rash noted.  Psychiatric: She has a normal mood and affect. Her behavior is normal. Judgment and thought content normal.  Nursing note and vitals reviewed.  BP 107/58 mmHg  Pulse 76  Temp(Src) 96.7 F (35.9 C) (Oral)  Ht _0  (1.676 m)  Wt 153 lb (69.4 kg)  BMI 24.71 kg/m2          Assessment & Plan:  1. Hypertension - POCT CBC - BMP8+EGFR - Hepatic function panel - NMR, lipoprofile  2. Hx of thyroid cancer - POCT CBC - Thyroid Panel With TSH  3. Vitamin D deficiency - POCT CBC - Vit D  25 hydroxy (rtn osteoporosis monitoring)  4. Annual physical exam - POCT CBC - BMP8+EGFR - Hepatic function panel - NMR, lipoprofile - Vit D  25 hydroxy (rtn osteoporosis monitoring) - Thyroid Panel With TSH  5. history of pheochromocytoma  6. Paroxysmal atrial fibrillation   Patient Instructions                       Medicare Annual Wellness Visit  [facility] and the medical providers at [facility] strive to bring you the best medical care.  In doing so we not only want to address your current medical conditions and concerns but also to detect new conditions early and prevent illness, disease and health-related problems.    Medicare offers a yearly Wellness Visit which allows our clinical staff to assess your need for preventative services including immunizations, lifestyle education, counseling to decrease risk of preventable diseases and screening for fall risk and other medical concerns.    This visit is provided free of charge (no copay) for all Medicare recipients. The clinical pharmacists at [facility] have begun to conduct these Wellness Visits which will also include a thorough review of all your medications.    As you primary medical provider recommend that you make an appointment for your Annual Wellness Visit if you have not done so already this year.  You may set up this  appointment before you leave today or you may call back ([phone]) and schedule an appointment.  Please make sure when you call that you mention that you are scheduling your Annual Wellness Visit with the clinical pharmacist so that the appointment may be made for the proper length of time.       Continue current medications. Continue good therapeutic lifestyle changes which include good diet and exercise. Fall precautions discussed with patient. If an FOBT was given today- please return it to our front desk. If you are over 34 years old - you may need Prevnar 12 or the adult Pneumonia vaccine.  Flu Shots will be available at our office starting mid- September. Please call and schedule a FLU CLINIC APPOINTMENT.   Please, please return the FOBT Keep appointments with cardiology, endocrinology and ophthalmology. Continue to be careful and do not put herself at risk for falling   Arrie Senate MD

## 2014-09-27 NOTE — Patient Instructions (Addendum)
Medicare Annual Wellness Visit  [facility] and the medical providers at [facility] strive to bring you the best medical care.  In doing so we not only want to address your current medical conditions and concerns but also to detect new conditions early and prevent illness, disease and health-related problems.    Medicare offers a yearly Wellness Visit which allows our clinical staff to assess your need for preventative services including immunizations, lifestyle education, counseling to decrease risk of preventable diseases and screening for fall risk and other medical concerns.    This visit is provided free of charge (no copay) for all Medicare recipients. The clinical pharmacists at [facility] have begun to conduct these Wellness Visits which will also include a thorough review of all your medications.    As you primary medical provider recommend that you make an appointment for your Annual Wellness Visit if you have not done so already this year.  You may set up this appointment before you leave today or you may call back [phone]) and schedule an appointment.  Please make sure when you call that you mention that you are scheduling your Annual Wellness Visit with the clinical pharmacist so that the appointment may be made for the proper length of time.       Continue current medications. Continue good therapeutic lifestyle changes which include good diet and exercise. Fall precautions discussed with patient. If an FOBT was given today- please return it to our front desk. If you are over 38 years old - you may need Prevnar 110 or the adult Pneumonia vaccine.  Flu Shots will be available at our office starting mid- September. Please call and schedule a FLU CLINIC APPOINTMENT.   Please, please return the FOBT Keep appointments with cardiology, endocrinology and ophthalmology. Continue to be careful and do not put herself at  risk for falling

## 2014-09-27 NOTE — Addendum Note (Signed)
Addended by: Zannie Cove on: 09/27/2014 09:19 AM   Modules accepted: Orders

## 2014-09-28 ENCOUNTER — Telehealth: Payer: Self-pay

## 2014-09-28 LAB — HEPATIC FUNCTION PANEL
ALBUMIN: 4.3 g/dL (ref 3.6–4.8)
ALT: 17 IU/L (ref 0–32)
AST: 24 IU/L (ref 0–40)
Alkaline Phosphatase: 85 IU/L (ref 39–117)
BILIRUBIN TOTAL: 0.5 mg/dL (ref 0.0–1.2)
Bilirubin, Direct: 0.17 mg/dL (ref 0.00–0.40)
Total Protein: 7.1 g/dL (ref 6.0–8.5)

## 2014-09-28 LAB — NMR, LIPOPROFILE
CHOLESTEROL: 185 mg/dL (ref 100–199)
HDL CHOLESTEROL BY NMR: 108 mg/dL (ref >39)
HDL Particle Number: 38.1 umol/L (ref >=30.5)
LDL Particle Number: 692 nmol/L (ref <1000)
LDL Size: 21.2 nm (ref >20.5)
LDL-C: 69 mg/dL (ref 0–99)
LP-IR Score: <25 (ref <=45)
Triglycerides by NMR: 41 mg/dL (ref 0–149)

## 2014-09-28 LAB — URINE CULTURE

## 2014-09-28 LAB — BMP8+EGFR
BUN / CREAT RATIO: 21 (ref 11–26)
BUN: 12 mg/dL (ref 8–27)
CHLORIDE: 101 mmol/L (ref 97–108)
CO2: 28 mmol/L (ref 18–29)
CREATININE: 0.57 mg/dL (ref 0.57–1.00)
Calcium: 9.5 mg/dL (ref 8.7–10.3)
GFR calc Af Amer: 110 mL/min/{1.73_m2} (ref >59)
GFR calc non Af Amer: 96 mL/min/{1.73_m2} (ref >59)
GLUCOSE: 89 mg/dL (ref 65–99)
Potassium: 4.2 mmol/L (ref 3.5–5.2)
Sodium: 142 mmol/L (ref 134–144)

## 2014-09-28 LAB — THYROID PANEL WITH TSH
Free Thyroxine Index: 3.6 (ref 1.2–4.9)
T3 UPTAKE RATIO: 32 % (ref 24–39)
T4, Total: 11.1 ug/dL (ref 4.5–12.0)
TSH: 0.007 u[IU]/mL — ABNORMAL LOW (ref 0.450–4.500)

## 2014-09-28 LAB — VITAMIN D 25 HYDROXY (VIT D DEFICIENCY, FRACTURES): VIT D 25 HYDROXY: 46.8 ng/mL (ref 30.0–100.0)

## 2014-09-28 NOTE — Telephone Encounter (Signed)
-----   Message from Chipper Herb, MD sent at 09/28/2014  8:30 AM EST ----- The CBC has a normal white blood cell count. The hemoglobin is good at 12.0. The platelet count is adequate. The blood sugar is good at 89. The creatinine the most important kidney function test is within normal limits. Electrolytes including potassium are within normal limits. Data liver function tests are within normal limits. All cholesterol numbers with advanced lipid testing are excellent and at goal, continue aggressive therapeutic lifestyle changes The vitamin D level is good, continue current treatment The TSH remains very low. The remainder of the thyroid tests are within normal limits. Continue treatment considering recommendations from Dr. Nicoletta Dress

## 2014-09-28 NOTE — Telephone Encounter (Signed)
Labs left on home am as per DPR of 02/2013 

## 2014-09-29 ENCOUNTER — Telehealth: Payer: Self-pay

## 2014-09-29 NOTE — Telephone Encounter (Signed)
-----   Message from Chipper Herb, MD sent at 09/29/2014  7:46 AM EST ----- The urine culture had no growth and therefore no antibiotic treatment is warranted.

## 2014-09-29 NOTE — Telephone Encounter (Signed)
Pt aware of results 

## 2014-11-17 ENCOUNTER — Other Ambulatory Visit: Payer: Self-pay | Admitting: Cardiology

## 2015-01-31 ENCOUNTER — Ambulatory Visit (INDEPENDENT_AMBULATORY_CARE_PROVIDER_SITE_OTHER): Payer: PPO | Admitting: Family Medicine

## 2015-01-31 ENCOUNTER — Encounter: Payer: Self-pay | Admitting: Family Medicine

## 2015-01-31 VITALS — BP 129/68 | HR 82 | Temp 96.7°F | Ht 66.0 in | Wt 151.0 lb

## 2015-01-31 DIAGNOSIS — Z86018 Personal history of other benign neoplasm: Secondary | ICD-10-CM

## 2015-01-31 DIAGNOSIS — E785 Hyperlipidemia, unspecified: Secondary | ICD-10-CM

## 2015-01-31 DIAGNOSIS — Z85850 Personal history of malignant neoplasm of thyroid: Secondary | ICD-10-CM

## 2015-01-31 DIAGNOSIS — Z8639 Personal history of other endocrine, nutritional and metabolic disease: Secondary | ICD-10-CM | POA: Diagnosis not present

## 2015-01-31 DIAGNOSIS — I10 Essential (primary) hypertension: Secondary | ICD-10-CM | POA: Diagnosis not present

## 2015-01-31 DIAGNOSIS — E559 Vitamin D deficiency, unspecified: Secondary | ICD-10-CM | POA: Diagnosis not present

## 2015-01-31 DIAGNOSIS — Z79899 Other long term (current) drug therapy: Secondary | ICD-10-CM

## 2015-01-31 DIAGNOSIS — I480 Paroxysmal atrial fibrillation: Secondary | ICD-10-CM | POA: Diagnosis not present

## 2015-01-31 DIAGNOSIS — K219 Gastro-esophageal reflux disease without esophagitis: Secondary | ICD-10-CM

## 2015-01-31 DIAGNOSIS — E279 Disorder of adrenal gland, unspecified: Secondary | ICD-10-CM | POA: Diagnosis not present

## 2015-01-31 LAB — POCT CBC
Granulocyte percent: 58.2 %G (ref 37–80)
HCT, POC: 41.7 % (ref 37.7–47.9)
HEMOGLOBIN: 13 g/dL (ref 12.2–16.2)
Lymph, poc: 1.9 (ref 0.6–3.4)
MCH, POC: 27.9 pg (ref 27–31.2)
MCHC: 31.3 g/dL — AB (ref 31.8–35.4)
MCV: 89 fL (ref 80–97)
MPV: 7.7 fL (ref 0–99.8)
PLATELET COUNT, POC: 207 10*3/uL (ref 142–424)
POC GRANULOCYTE: 3.2 (ref 2–6.9)
POC LYMPH %: 34.2 % (ref 10–50)
RBC: 4.68 M/uL (ref 4.04–5.48)
RDW, POC: 12.9 %
WBC: 5.5 10*3/uL (ref 4.6–10.2)

## 2015-01-31 NOTE — Patient Instructions (Addendum)
Medicare Annual Wellness Visit  [facility] and the medical providers at [facility] strive to bring you the best medical care.  In doing so we not only want to address your current medical conditions and concerns but also to detect new conditions early and prevent illness, disease and health-related problems.    Medicare offers a yearly Wellness Visit which allows our clinical staff to assess your need for preventative services including immunizations, lifestyle education, counseling to decrease risk of preventable diseases and screening for fall risk and other medical concerns.    This visit is provided free of charge (no copay) for all Medicare recipients. The clinical pharmacists at [facility] have begun to conduct these Wellness Visits which will also include a thorough review of all your medications.    As you primary medical provider recommend that you make an appointment for your Annual Wellness Visit if you have not done so already this year.  You may set up this appointment before you leave today or you may call back ([phone]) and schedule an appointment.  Please make sure when you call that you mention that you are scheduling your Annual Wellness Visit with the clinical pharmacist so that the appointment may be made for the proper length of time.     Continue current medications. Continue good therapeutic lifestyle changes which include good diet and exercise. Fall precautions discussed with patient. If an FOBT was given today- please return it to our front desk. If you are over 50 years old - you may need Prevnar 66 or the adult Pneumonia vaccine.  Flu Shots are still available at our office. If you still haven't had one please call to set up a nurse visit to get one.   After your visit with [place] today you will receive a survey in the mail or online from [employer] regarding your care with [place]. Please take a moment to  fill this out. Your feedback is very important to [place] as you can help [place] better understand your patient needs as well as improve your experience and satisfaction. WE CARE ABOUT YOU!!!   The patient should keep her appointments with cardiology and ophthalmology.

## 2015-01-31 NOTE — Progress Notes (Signed)
Subjective:    Patient ID: [name], female    DOB: 1946-04-21, 69 y.o.   MRN: [id number]  HPI Pt here for follow up and management of chronic medical problems which includes hypertension and hyperlipidemia. She is taking medications regularly. 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 She is also on chronic thyroid replacement because of thyroid cancer. She is also had several fractures in the past but she has not had any falls recently and she is doing well with this. The patient does see the cardiologist and an endocrinologist periodically. She is also due to see the ophthalmologist soon.         Patient Active Problem List   Diagnosis Date Noted  . History of pheochromocytoma 02/17/2014  . Cold intolerance 02/17/2014  . Vitamin D deficiency 10/21/2013  . 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 hypothyroidism 10/21/2013  . Hx of thyroid cancer 06/18/2013  . High risk medication use 06/18/2013  . Sinus tachycardia 07/06/2012  . Hypokalemia 07/06/2012  . Pheochromocytoma, history of 05/21/2012  . Hypertension 04/10/2010  . Atrial fibrillation 04/04/2010  . Mitral valve disorder 03/28/2010  . 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 Artificial Tear Ointment (REFRESH LACRI-LUBE) OINT Apply 1 inch to eye at bedtime. Apply to bottom lid  . aspirin 325 MG EC tablet Take 325 mg by mouth daily.   . Cholecalciferol (VITAMIN D3) 2000 UNITS TABS Take 1.5 capsules by mouth daily.   . flecainide (TAMBOCOR) 50 MG tablet TAKE 1 TABLET (50 MG TOTAL) BY MOUTH 2 (TWO) TIMES DAILY.  Marland Kitchen loratadine (CLARITIN) 10 MG tablet Take 10 mg by mouth daily as needed.   . Multiple Vitamins-Minerals (CENTRUM SILVER) tablet Take 1 tablet by mouth every morning.   . Multiple Vitamins-Minerals (PRESERVISION/LUTEIN PO) Take 1 capsule by mouth daily.  . Omega-3 Fatty Acids (FISH  OIL) 1200 MG CAPS Take 1 capsule by mouth daily.  Marland Kitchen SYNTHROID 150 MCG tablet Take 1 tablet (150 mcg total) by mouth as directed.    Review of Systems  Constitutional: Negative.   HENT: Negative.   Eyes: Negative.   Respiratory: Negative.   Cardiovascular: Negative.   Gastrointestinal: Negative.   Endocrine: Negative.   Genitourinary: Negative.   Musculoskeletal: Negative.   Skin: Negative.   Allergic/Immunologic: Negative.   Neurological: Negative.   Hematological: Negative.   Psychiatric/Behavioral: Negative.        Objective:   Physical Exam  Constitutional: She is oriented to person, place, and time. She appears well-developed and well-nourished. No distress.  HENT:  Head: Normocephalic and atraumatic.  Right Ear: External ear normal.  Left Ear: External ear normal.  Nose: Nose normal.  Mouth/Throat: Oropharynx is clear and moist. No oropharyngeal exudate.  Eyes: Conjunctivae and EOM are normal. Pupils are equal, round, and reactive to light. Right eye exhibits no discharge. Left eye exhibits no discharge. No scleral icterus.  Neck: Normal range of motion. Neck supple. No thyromegaly present.  There no carotid bruits or anterior cervical adenopathy. There is no thyromegaly or thyroid masses.  Cardiovascular: Normal rate, regular rhythm, normal heart sounds and intact distal pulses.  Exam reveals no gallop and no friction rub.   No murmur heard. The heart has a regular rate and rhythm at 72/m  Pulmonary/Chest: Effort normal and breath sounds normal. No respiratory distress. She has no wheezes. She has no rales. She exhibits no tenderness.  Abdominal: Soft. Bowel sounds are normal. She exhibits no mass. There is no tenderness. There is no rebound and no guarding.  The abdomen is nontender and there are no masses or bruits  Musculoskeletal: Normal range of motion. She exhibits no edema or tenderness.  Lymphadenopathy:    She has no cervical adenopathy.  Neurological: She is  alert and oriented to person, place, and time. She has normal reflexes. No cranial nerve deficit.  Skin: Skin is warm and dry. No rash noted.  Psychiatric: She has a normal mood and affect. Her behavior is normal. Judgment and thought content normal.  Nursing note and vitals reviewed.  BP 129/68 mmHg  Pulse 82  Temp(Src) 96.7 F (35.9 C) (Oral)  Ht 5' 6"  (1.676 m)  Wt 151 lb (68.493 kg)  BMI 24.38 kg/m2        Assessment & Plan:  1. Hypertension -The blood pressure is under good control with her current medications. - POCT CBC - BMP8+EGFR - Hepatic function panel  2. Vitamin D deficiency -The patient has a history of osteoporosis and we'll have her vitamin D checked today and any change in treatment will be addressed after the results are returned - POCT CBC - Vit D  25 hydroxy (rtn osteoporosis monitoring)  3. Hx of thyroid cancer -She continues to take her thyroid replacement medicine and any changes in treatment will be addressed when the results are returned - POCT CBC - Thyroid Panel With TSH  4. Paroxysmal atrial fibrillation -The patient has had no problems with atrial fibrillation and sees the cardiologist every 6 months. - POCT CBC  5. High risk medication use -The patient indicates that she thinks the flecainide makes her feel colder than usual. - POCT CBC  6. Hyperlipemia -She will continue with her diet and exercise regimen and with omega-3 fatty acids - POCT CBC - NMR, lipoprofile  7. Gastroesophageal reflux disease, esophagitis presence not specified -She is doing well with this today and having no complaints. - POCT CBC - Hepatic function panel  8. Adrenal gland disorder -She has a history of a fee oh chromocytoma and we will recheck her cortisone levels today. - POCT CBC - Cortisol  9. History of pheochromocytoma -She is several years out from this pheochromocytoma removal and currently she is having no symptoms of cortisol deficiency Patient  Instructions                       Medicare Annual Wellness Visit  [facility] and the medical providers at [facility] strive to bring you the best medical care.  In doing so we not only want to address your current medical conditions and concerns but also to detect new conditions early and prevent illness, disease and health-related problems.    Medicare offers a yearly Wellness Visit which allows our clinical staff to assess your need for preventative services including immunizations, lifestyle education, counseling to decrease risk of preventable diseases and screening for fall risk and other medical concerns.    This visit is provided free of charge (no copay) for all Medicare recipients. The clinical pharmacists at [facility] have begun to conduct these Wellness Visits which will also include a thorough review of all your medications.    As you primary medical provider recommend that you make an appointment for your Annual Wellness Visit if you have not done so already this year.  You may set up this appointment before you  leave today or you may call back ([phone]) and schedule an appointment.  Please make sure when you call that you mention that you are scheduling your Annual Wellness Visit with the clinical pharmacist so that the appointment may be made for the proper length of time.     Continue current medications. Continue good therapeutic lifestyle changes which include good diet and exercise. Fall precautions discussed with patient. If an FOBT was given today- please return it to our front desk. If you are over 37 years old - you may need Prevnar 69 or the adult Pneumonia vaccine.  Flu Shots are still available at our office. If you still haven't had one please call to set up a nurse visit to get one.   After your visit with [place] today you will receive a survey in the mail or online from [employer] regarding your care with [place]. Please  take a moment to fill this out. Your feedback is very important to [place] as you can help [place] better understand your patient needs as well as improve your experience and satisfaction. WE CARE ABOUT YOU!!!   The patient should keep her appointments with cardiology and ophthalmology.    Arrie Senate MD

## 2015-02-01 LAB — HEPATIC FUNCTION PANEL
ALBUMIN: 4.4 g/dL (ref 3.6–4.8)
ALK PHOS: 87 IU/L (ref 39–117)
ALT: 21 IU/L (ref 0–32)
AST: 26 IU/L (ref 0–40)
Bilirubin Total: 0.6 mg/dL (ref 0.0–1.2)
Bilirubin, Direct: 0.17 mg/dL (ref 0.00–0.40)
Total Protein: 6.9 g/dL (ref 6.0–8.5)

## 2015-02-01 LAB — NMR, LIPOPROFILE
Cholesterol: 171 mg/dL (ref 100–199)
HDL Cholesterol by NMR: 100 mg/dL (ref >39)
HDL PARTICLE NUMBER: 36.3 umol/L (ref >=30.5)
LDL Particle Number: 470 nmol/L (ref <1000)
LDL Size: 21 nm (ref >20.5)
LDL-C: 63 mg/dL (ref 0–99)
Small LDL Particle Number: <90 nmol/L (ref <=527)
Triglycerides by NMR: 38 mg/dL (ref 0–149)

## 2015-02-01 LAB — BMP8+EGFR
BUN / CREAT RATIO: 20 (ref 11–26)
BUN: 12 mg/dL (ref 8–27)
CALCIUM: 9.7 mg/dL (ref 8.7–10.3)
CHLORIDE: 104 mmol/L (ref 97–108)
CO2: 25 mmol/L (ref 18–29)
Creatinine, Ser: 0.61 mg/dL (ref 0.57–1.00)
GFR calc Af Amer: 108 mL/min/{1.73_m2} (ref >59)
GFR calc non Af Amer: 93 mL/min/{1.73_m2} (ref >59)
GLUCOSE: 94 mg/dL (ref 65–99)
Potassium: 4.7 mmol/L (ref 3.5–5.2)
Sodium: 144 mmol/L (ref 134–144)

## 2015-02-01 LAB — THYROID PANEL WITH TSH
FREE THYROXINE INDEX: 3.7 (ref 1.2–4.9)
T3 Uptake Ratio: 33 % (ref 24–39)
T4, Total: 11.3 ug/dL (ref 4.5–12.0)
TSH: 0.01 u[IU]/mL — AB (ref 0.450–4.500)

## 2015-02-01 LAB — VITAMIN D 25 HYDROXY (VIT D DEFICIENCY, FRACTURES): Vit D, 25-Hydroxy: 53.3 ng/mL (ref 30.0–100.0)

## 2015-02-01 LAB — CORTISOL: Cortisol: 8.4 ug/dL

## 2015-02-16 ENCOUNTER — Ambulatory Visit (INDEPENDENT_AMBULATORY_CARE_PROVIDER_SITE_OTHER): Payer: PPO | Admitting: Cardiology

## 2015-02-16 ENCOUNTER — Encounter: Payer: Self-pay | Admitting: Cardiology

## 2015-02-16 VITALS — BP 120/62 | HR 68 | Ht 66.0 in | Wt 151.0 lb

## 2015-02-16 DIAGNOSIS — I480 Paroxysmal atrial fibrillation: Secondary | ICD-10-CM | POA: Diagnosis not present

## 2015-02-16 DIAGNOSIS — I10 Essential (primary) hypertension: Secondary | ICD-10-CM
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
## 2015-02-16 NOTE — Patient Instructions (Signed)
The current medical regimen is effective;  continue present plan and medications.  Follow up in 1 year with Dr. Hochrein in [place].  You will receive a letter in the mail 2 months before you are due.  Please call us when you receive this letter to schedule your follow up appointment.  Thank you for choosing [facility]!!     

## 2015-03-16 ENCOUNTER — Other Ambulatory Visit: Payer: Self-pay | Admitting: Family Medicine

## 2015-03-16 ENCOUNTER — Other Ambulatory Visit: Payer: Self-pay | Admitting: Cardiology

## 2015-04-15 ENCOUNTER — Encounter: Payer: Self-pay | Admitting: *Deleted

## 2015-04-20 ENCOUNTER — Other Ambulatory Visit: Payer: Self-pay | Admitting: Cardiology

## 2015-06-15 ENCOUNTER — Ambulatory Visit (INDEPENDENT_AMBULATORY_CARE_PROVIDER_SITE_OTHER): Payer: PPO

## 2015-06-15 ENCOUNTER — Ambulatory Visit (INDEPENDENT_AMBULATORY_CARE_PROVIDER_SITE_OTHER): Payer: PPO | Admitting: Family Medicine

## 2015-06-15 ENCOUNTER — Encounter: Payer: Self-pay | Admitting: Family Medicine

## 2015-06-15 VITALS — BP 99/61 | HR 73 | Temp 97.1°F | Ht 66.0 in | Wt 147.0 lb

## 2015-06-15 DIAGNOSIS — I480 Paroxysmal atrial fibrillation: Secondary | ICD-10-CM

## 2015-06-15 DIAGNOSIS — E559 Vitamin D deficiency, unspecified: Secondary | ICD-10-CM | POA: Diagnosis not present

## 2015-06-15 DIAGNOSIS — K219 Gastro-esophageal reflux disease without esophagitis: Secondary | ICD-10-CM

## 2015-06-15 DIAGNOSIS — E785 Hyperlipidemia, unspecified: Secondary | ICD-10-CM

## 2015-06-15 DIAGNOSIS — I10 Essential (primary) hypertension: Secondary | ICD-10-CM | POA: Diagnosis not present

## 2015-06-15 DIAGNOSIS — C73 Malignant neoplasm of thyroid gland: Secondary | ICD-10-CM | POA: Diagnosis not present

## 2015-06-15 NOTE — Patient Instructions (Addendum)
Medicare Annual Wellness Visit  [facility] and the medical providers at [facility] strive to bring you the best medical care.  In doing so we not only want to address your current medical conditions and concerns but also to detect new conditions early and prevent illness, disease and health-related problems.    Medicare offers a yearly Wellness Visit which allows our clinical staff to assess your need for preventative services including immunizations, lifestyle education, counseling to decrease risk of preventable diseases and screening for fall risk and other medical concerns.    This visit is provided free of charge (no copay) for all Medicare recipients. The clinical pharmacists at [facility] have begun to conduct these Wellness Visits which will also include a thorough review of all your medications.    As you primary medical provider recommend that you make an appointment for your Annual Wellness Visit if you have not done so already this year.  You may set up this appointment before you leave today or you may call back ([phone]) and schedule an appointment.  Please make sure when you call that you mention that you are scheduling your Annual Wellness Visit with the clinical pharmacist so that the appointment may be made for the proper length of time.     Continue current medications. Continue good therapeutic lifestyle changes which include good diet and exercise. Fall precautions discussed with patient. If an FOBT was given today- please return it to our front desk. If you are over 72 years old - you may need Prevnar 22 or the adult Pneumonia vaccine.   After your visit with [place] today you will receive a survey in the mail or online from [employer] regarding your care with [place]. Please take a moment to fill this out. Your feedback is very important to [place] as you can help [place] better understand your patient needs as well as  improve your experience and satisfaction. WE CARE ABOUT YOU!!!   Please return the FOBT We will call you with your lab work as soon as it becomes available

## 2015-06-15 NOTE — Progress Notes (Signed)
 Subjective:    Patient ID: [name], female    DOB: 12/11/1945, 69 y.o.   MRN: [id number]  HPI Pt here for follow up and management of chronic medical problems which includes hypertension, history of thyroid cancer, and atrial fibrillation. She is taking medications regularly. The patient is also followed periodically by the cardiologist and by the endocrinologist. She has had a rash on her left ankle that she's been using a cold tar preparation over-the-counter which is gotten better. She said she tried some cortisone and Lamisil with no relief for this. The patient denies chest pain, shortness of breath problems with her GI tract other than occasional heartburn secondary to diet and she denies problems voiding. She periodically does see the cardiologist yearly the endocrinologist as needed and she is following up regularly with the ophthalmologist.      Patient Active Problem List   Diagnosis Date Noted  . History of pheochromocytoma 02/17/2014  . Cold intolerance 02/17/2014  . Vitamin D deficiency 10/21/2013  . 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 hypothyroidism 10/21/2013  . Hx of thyroid cancer 06/18/2013  . High risk medication use 06/18/2013  . Sinus tachycardia 07/06/2012  . Hypokalemia 07/06/2012  . Pheochromocytoma, history of 05/21/2012  . Hypertension 04/10/2010  . Atrial fibrillation 04/04/2010  . Mitral valve disorder 03/28/2010  . 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 Artificial Tear Ointment (REFRESH LACRI-LUBE) OINT Apply 1 inch to eye at bedtime. Apply to bottom lid  . aspirin 325 MG EC tablet Take 325 mg by mouth daily.   . Cholecalciferol (VITAMIN D3) 2000 UNITS TABS Take 1 capsule by mouth daily.   . flecainide (TAMBOCOR) 50 MG tablet TAKE 1 TABLET (50 MG TOTAL) BY MOUTH 2 (TWO) TIMES DAILY.  . loratadine (CLARITIN) 10 MG tablet Take 10 mg by mouth daily as needed.   .  Multiple Vitamins-Minerals (CENTRUM SILVER) tablet Take 1 tablet by mouth every morning.   . Multiple Vitamins-Minerals (PRESERVISION/LUTEIN PO) Take 1 capsule by mouth daily.  . Omega-3 Fatty Acids (FISH OIL) 1200 MG CAPS Take 1 capsule by mouth daily.  . SYNTHROID 150 MCG tablet TAKE 1 TABLET (150 MCG TOTAL) BY MOUTH AS DIRECTED.   No facility-administered encounter medications on file as of 06/15/2015.      Review of Systems  Constitutional: Negative.   HENT: Negative.   Eyes: Negative.   Respiratory: Negative.   Cardiovascular: Negative.   Gastrointestinal: Negative.   Endocrine: Negative.   Genitourinary: Negative.   Musculoskeletal: Negative.   Skin: Negative.   Allergic/Immunologic: Negative.   Neurological: Negative.   Hematological: Negative.   Psychiatric/Behavioral: Negative.        Objective:   Physical Exam  Constitutional: She is oriented to person, place, and time. She appears well-developed and well-nourished. No distress.  HENT:  Head: Normocephalic and atraumatic.  Right Ear: External ear normal.  Left Ear: External ear normal.  Nose: Nose normal.  Mouth/Throat: Oropharynx is clear and moist. No oropharyngeal exudate.  Eyes: Conjunctivae and EOM are normal. Pupils are equal, round, and reactive to light. Right eye exhibits no discharge. Left eye exhibits no discharge. No scleral icterus.  Neck: Normal range of motion. Neck supple. No thyromegaly present.  Cardiovascular: Normal rate, regular rhythm, normal heart sounds and intact distal pulses.   No murmur heard. The rhythm is regular today at 72/m  Pulmonary/Chest: Effort normal and breath sounds normal. No respiratory distress.   She has no wheezes. She has no rales. She exhibits no tenderness.  Abdominal: Soft. Bowel sounds are normal. She exhibits no mass. There is no tenderness. There is no rebound and no guarding.  Musculoskeletal: Normal range of motion. She exhibits no edema or tenderness.    Lymphadenopathy:    She has no cervical adenopathy.  Neurological: She is alert and oriented to person, place, and time. She has normal reflexes. No cranial nerve deficit.  Skin: Skin is warm and dry. Rash noted. No erythema. No pallor.  The patient has a healing rash above her left lateral malleolus  Psychiatric: She has a normal mood and affect. Her behavior is normal. Judgment and thought content normal.  Nursing note and vitals reviewed.   BP 99/61 mmHg  Pulse 73  Temp(Src) 97.1 F (36.2 C) (Oral)  Ht 5' 6" (1.676 m)  Wt 147 lb (66.679 kg)  BMI 23.74 kg/m2 [facility] reading (PRIMARY) by  Dr.Moore-chest x-ray no active disease                                        Assessment & Plan:  1. Hypertension -The blood pressure is good today and she is currently not taking any medication for this and most of the problems with hypertension in the past may have certainly been associated with her pheochromocytoma. - BMP8+EGFR - Hepatic function panel - DG Chest 2 View; Future - CBC with Differential/Platelet - Lipid panel  2. Vitamin D deficiency -She will continue with her current vitamin D replacement pending results of lab work - Vit D  25 hydroxy (rtn osteoporosis monitoring) - CBC with Differential/Platelet - Lipid panel  3. Paroxysmal atrial fibrillation -She now sees the cardiologist yearly and she describes no problems with palpitations or irregular heartbeat. Her rhythm today was regular at 72/m. - DG Chest 2 View; Future - CBC with Differential/Platelet - Lipid panel  4. Hyperlipemia -Any treatment for this will be determined by the results of her lab work which is pending. - CBC with Differential/Platelet - Lipid panel  5. Gastroesophageal reflux disease, esophagitis presence not specified -She only has problems with reflux and heartburn occasionally and does not take any medication regularly. - CBC with Differential/Platelet - Lipid panel  6. Thyroid cancer -He  continues to remain on thyroid suppression therapy and has been for years. She will follow up periodically with the endocrinologist. - Thyroid Panel With TSH - CBC with Differential/Platelet - Lipid panel  No orders of the defined types were placed in this encounter.   Patient Instructions                       Medicare Annual Wellness Visit  [facility] and the medical providers at [facility] strive to bring you the best medical care.  In doing so we not only want to address your current medical conditions and concerns but also to detect new conditions early and prevent illness, disease and health-related problems.    Medicare offers a yearly Wellness Visit which allows our clinical staff to assess your need for preventative services including immunizations, lifestyle education, counseling to decrease risk of preventable diseases and screening for fall risk and other medical concerns.    This visit is provided free of charge (no copay) for all Medicare recipients. The clinical pharmacists at [facility] have begun to conduct   these Wellness Visits which will also include a thorough review of all your medications.    As you primary medical provider recommend that you make an appointment for your Annual Wellness Visit if you have not done so already this year.  You may set up this appointment before you leave today or you may call back [phone]) and schedule an appointment.  Please make sure when you call that you mention that you are scheduling your Annual Wellness Visit with the clinical pharmacist so that the appointment may be made for the proper length of time.     Continue current medications. Continue good therapeutic lifestyle changes which include good diet and exercise. Fall precautions discussed with patient. If an FOBT was given today- please return it to our front desk. If you are over 24 years old - you may need Prevnar 98 or the  adult Pneumonia vaccine.   After your visit with [place] today you will receive a survey in the mail or online from [employer] regarding your care with [place]. Please take a moment to fill this out. Your feedback is very important to [place] as you can help [place] better understand your patient needs as well as improve your experience and satisfaction. WE CARE ABOUT YOU!!!   Please return the FOBT We will call you with your lab work as soon as it becomes available    Arrie Senate MD

## 2015-06-16 ENCOUNTER — Ambulatory Visit: Payer: PPO | Admitting: Family Medicine

## 2015-06-16 LAB — BMP8+EGFR
BUN/Creatinine Ratio: 28 — ABNORMAL HIGH (ref 11–26)
BUN: 16 mg/dL (ref 8–27)
CALCIUM: 9.1 mg/dL (ref 8.7–10.3)
CO2: 24 mmol/L (ref 18–29)
CREATININE: 0.57 mg/dL (ref 0.57–1.00)
Chloride: 100 mmol/L (ref 97–108)
GFR calc Af Amer: 109 mL/min/{1.73_m2} (ref >59)
GFR calc non Af Amer: 95 mL/min/{1.73_m2} (ref >59)
GLUCOSE: 81 mg/dL (ref 65–99)
POTASSIUM: 3.9 mmol/L (ref 3.5–5.2)
Sodium: 140 mmol/L (ref 134–144)

## 2015-06-16 LAB — CBC WITH DIFFERENTIAL/PLATELET
Basophils Absolute: 0 10*3/uL (ref 0.0–0.2)
Basos: 0 %
EOS (ABSOLUTE): 0.4 10*3/uL (ref 0.0–0.4)
EOS: 8 %
Hematocrit: 39.3 % (ref 34.0–46.6)
Hemoglobin: 13 g/dL (ref 11.1–15.9)
Immature Grans (Abs): 0 10*3/uL (ref 0.0–0.1)
Immature Granulocytes: 0 %
LYMPHS: 27 %
Lymphocytes Absolute: 1.4 10*3/uL (ref 0.7–3.1)
MCH: 30.1 pg (ref 26.6–33.0)
MCHC: 33.1 g/dL (ref 31.5–35.7)
MCV: 91 fL (ref 79–97)
MONOCYTES: 12 %
Monocytes Absolute: 0.6 10*3/uL (ref 0.1–0.9)
Neutrophils Absolute: 2.7 10*3/uL (ref 1.4–7.0)
Neutrophils: 53 %
PLATELETS: 190 10*3/uL (ref 150–379)
RBC: 4.32 x10E6/uL (ref 3.77–5.28)
RDW: 13.6 % (ref 12.3–15.4)
WBC: 5.3 10*3/uL (ref 3.4–10.8)

## 2015-06-16 LAB — HEPATIC FUNCTION PANEL
ALK PHOS: 94 IU/L (ref 39–117)
ALT: 20 IU/L (ref 0–32)
AST: 22 IU/L (ref 0–40)
Albumin: 4.1 g/dL (ref 3.6–4.8)
BILIRUBIN, DIRECT: 0.18 mg/dL (ref 0.00–0.40)
Bilirubin Total: 0.5 mg/dL (ref 0.0–1.2)
Total Protein: 7 g/dL (ref 6.0–8.5)

## 2015-06-16 LAB — THYROID PANEL WITH TSH
Free Thyroxine Index: 4.2 (ref 1.2–4.9)
T3 UPTAKE RATIO: 34 % (ref 24–39)
T4 TOTAL: 12.4 ug/dL — AB (ref 4.5–12.0)
TSH: 0.005 u[IU]/mL — AB (ref 0.450–4.500)

## 2015-06-16 LAB — VITAMIN D 25 HYDROXY (VIT D DEFICIENCY, FRACTURES): VIT D 25 HYDROXY: 46.1 ng/mL (ref 30.0–100.0)

## 2015-06-16 LAB — LIPID PANEL
CHOLESTEROL TOTAL: 171 mg/dL (ref 100–199)
Chol/HDL Ratio: 1.6 ratio units (ref 0.0–4.4)
HDL: 104 mg/dL (ref >39)
LDL Calculated: 61 mg/dL (ref 0–99)
Triglycerides: 32 mg/dL (ref 0–149)
VLDL Cholesterol Cal: 6 mg/dL (ref 5–40)

## 2015-08-11 ENCOUNTER — Ambulatory Visit (INDEPENDENT_AMBULATORY_CARE_PROVIDER_SITE_OTHER): Payer: PPO

## 2015-08-11 DIAGNOSIS — Z23 Encounter for immunization: Secondary | ICD-10-CM | POA: Diagnosis not present

## 2015-11-01 ENCOUNTER — Ambulatory Visit (INDEPENDENT_AMBULATORY_CARE_PROVIDER_SITE_OTHER): Payer: PPO | Admitting: Family Medicine

## 2015-11-01 ENCOUNTER — Encounter: Payer: Self-pay | Admitting: Family Medicine

## 2015-11-01 VITALS — BP 101/61 | HR 70 | Temp 96.8°F | Ht 66.0 in | Wt 150.0 lb

## 2015-11-01 DIAGNOSIS — K219 Gastro-esophageal reflux disease without esophagitis: Secondary | ICD-10-CM

## 2015-11-01 DIAGNOSIS — C73 Malignant neoplasm of thyroid gland: Secondary | ICD-10-CM

## 2015-11-01 DIAGNOSIS — M810 Age-related osteoporosis without current pathological fracture: Secondary | ICD-10-CM

## 2015-11-01 DIAGNOSIS — E785 Hyperlipidemia, unspecified: Secondary | ICD-10-CM

## 2015-11-01 DIAGNOSIS — I480 Paroxysmal atrial fibrillation: Secondary | ICD-10-CM | POA: Diagnosis not present

## 2015-11-01 DIAGNOSIS — I10 Essential (primary) hypertension: Secondary | ICD-10-CM

## 2015-11-01 DIAGNOSIS — E559 Vitamin D deficiency, unspecified: Secondary | ICD-10-CM | POA: Diagnosis not present

## 2015-11-01 NOTE — Patient Instructions (Addendum)
Medicare Annual Wellness Visit  [facility] and the medical providers at [facility] strive to bring you the best medical care.  In doing so we not only want to address your current medical conditions and concerns but also to detect new conditions early and prevent illness, disease and health-related problems.    Medicare offers a yearly Wellness Visit which allows our clinical staff to assess your need for preventative services including immunizations, lifestyle education, counseling to decrease risk of preventable diseases and screening for fall risk and other medical concerns.    This visit is provided free of charge (no copay) for all Medicare recipients. The clinical pharmacists at [facility] have begun to conduct these Wellness Visits which will also include a thorough review of all your medications.    As you primary medical provider recommend that you make an appointment for your Annual Wellness Visit if you have not done so already this year.  You may set up this appointment before you leave today or you may call back [phone] and schedule an appointment.  Please make sure when you call that you mention that you are scheduling your Annual Wellness Visit with the clinical pharmacist so that the appointment may be made for the proper length of time.     Continue current medications. Continue good therapeutic lifestyle changes which include good diet and exercise. Fall precautions discussed with patient. If an FOBT was given today- please return it to our front desk. If you are over 22 years old - you may need Prevnar 59 or the adult Pneumonia vaccine.  **Flu shots are available--- please call and schedule a FLU-CLINIC appointment**  After your visit with [place] today you will receive a survey in the mail or online from [employer] regarding your care with [place]. Please take a moment to fill this out. Your feedback is very  important to [place] as you can help [place] better understand your patient needs as well as improve your experience and satisfaction. WE CARE ABOUT YOU!!!   Continue to walk and exercise but always be careful and do not put yourself at risk for falling because of your previous fractures Continue current medicines as doing Continue follow-up with cardiology as planned

## 2015-11-01 NOTE — Progress Notes (Signed)
Subjective:    Patient ID: [name], female    DOB: August 24, 1946, 69 y.o.   MRN: [id number]  HPI Pt here for follow up and management of chronic medical problems which includes hypertension and hyperlipidemia. She is taking medications regularly. The patient today has no complaints. She is due to get her mammogram next week. She refuses to get Pap smears. She is also due for an FOBT and lab work. She is followed regularly by the cardiologist yearly and sees the endocrinologist periodically. She does not follow up with anyone about the pheochromocytoma. She had a recent left eye cataract by Dr. Katy Fitch. She remains active. She denies chest pain or shortness of breath. She has occasional indigestion secondary to her diet. She has not seen any blood in the stool or had any black tarry bowel movements. She is passing her water without problems.         Patient Active Problem List   Diagnosis Date Noted  . History of pheochromocytoma 02/17/2014  . Cold intolerance 02/17/2014  . Vitamin D deficiency 10/21/2013  . 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 hypothyroidism 10/21/2013  . Hx of thyroid cancer 06/18/2013  . High risk medication use 06/18/2013  . Sinus tachycardia ([place]) 07/06/2012  . Hypokalemia 07/06/2012  . Pheochromocytoma, history of 05/21/2012  . Hypertension 04/10/2010  . Atrial fibrillation ([place]) 04/04/2010  . Mitral valve disorder 03/28/2010  . 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 Artificial Tear Ointment (REFRESH LACRI-LUBE) OINT Apply 1 inch to eye at bedtime. Apply to bottom lid  . aspirin 325 MG EC tablet Take 325 mg by mouth daily.   . Cholecalciferol (VITAMIN D3) 2000 UNITS TABS Take 1 capsule by mouth daily.   . flecainide (TAMBOCOR) 50 MG tablet TAKE 1 TABLET (50 MG TOTAL) BY MOUTH 2 (TWO) TIMES DAILY.  Marland Kitchen loratadine (CLARITIN) 10 MG tablet Take 10 mg by mouth daily as needed.     . Multiple Vitamins-Minerals (CENTRUM SILVER) tablet Take 1 tablet by mouth every morning.   . Multiple Vitamins-Minerals (PRESERVISION/LUTEIN PO) Take 1 capsule by mouth daily.  . Omega-3 Fatty Acids (FISH OIL) 1200 MG CAPS Take 1 capsule by mouth daily.  Marland Kitchen SYNTHROID 150 MCG tablet TAKE 1 TABLET (150 MCG TOTAL) BY MOUTH AS DIRECTED.   No facility-administered encounter medications on file as of 11/01/2015.     Review of Systems  Constitutional: Negative.   HENT: Negative.   Eyes: Negative.   Respiratory: Negative.   Cardiovascular: Negative.   Gastrointestinal: Negative.   Endocrine: Negative.   Genitourinary: Negative.   Musculoskeletal: Negative.   Skin: Negative.   Allergic/Immunologic: Negative.   Neurological: Negative.   Hematological: Negative.   Psychiatric/Behavioral: Negative.        Objective:   Physical Exam  Constitutional: She is oriented to person, place, and time. She appears well-developed and well-nourished. No distress.  Pleasant alert small framed  HENT:  Head: Normocephalic and atraumatic.  Right Ear: External ear normal.  Left Ear: External ear normal.  Nose: Nose normal.  Mouth/Throat: Oropharynx is clear and moist.  Eyes: Conjunctivae and EOM are normal. Pupils are equal, round, and reactive to light. Right eye exhibits no discharge. Left eye exhibits no discharge. No scleral icterus.  Neck: Normal range of motion. Neck supple. No thyromegaly present.  Without bruits or thyromegaly  Cardiovascular: Normal rate, regular rhythm, normal heart sounds and intact distal pulses.   No  murmur heard. The rhythm is regular at 72/m  Pulmonary/Chest: Effort normal and breath sounds normal. No respiratory distress. She has no wheezes. She has no rales.  Clear anteriorly and posteriorly  Abdominal: Soft. Bowel sounds are normal. She exhibits no mass. There is no tenderness. There is no rebound and no guarding.  No abdominal tenderness masses or organ  enlargement  Musculoskeletal: Normal range of motion. She exhibits no edema.  Lymphadenopathy:    She has no cervical adenopathy.  Neurological: She is alert and oriented to person, place, and time.  Skin: Skin is warm and dry. No rash noted.  Psychiatric: She has a normal mood and affect. Her behavior is normal. Judgment and thought content normal.  Nursing note and vitals reviewed.  BP 101/61 mmHg  Pulse 70  Temp(Src) 96.8 F (36 C) (Oral)  Ht 5' 6" (1.676 m)  Wt 150 lb (68.04 kg)  BMI 24.22 kg/m2        Assessment & Plan:  1. Hypertension -The blood pressure is good today and she will continue with current treatment - BMP8+EGFR - CBC with Differential/Platelet - Hepatic function panel  2. Vitamin D deficiency -Continue with current treatment and also with vitamin D replacement - CBC with Differential/Platelet - VITAMIN D 25 Hydroxy (Vit-D Deficiency, Fractures)  3. Paroxysmal atrial fibrillation (HCC) -Rhythm is regular today and she should follow-up with the cardiologist as planned - CBC with Differential/Platelet  4. Hyperlipemia -Continue with Fish oil and as aggressive therapeutic lifestyle changes as possible - CBC with Differential/Platelet - NMR, lipoprofile  5. Gastroesophageal reflux disease, esophagitis presence not specified -Continue to watch diet - CBC with Differential/Platelet  6. Thyroid cancer ([place]) -Continue with thyroid replacement and periodic follow-ups with endocrinologist - CBC with Differential/Platelet - Thyroid Panel With TSH  7. Osteoporosis, post-menopausal -Continue calcium and vitamin D and fall prevention as discussed  Patient Instructions                       Medicare Annual Wellness Visit  [facility] and the medical providers at [facility] strive to bring you the best medical care.  In doing so we not only want to address your current medical conditions and concerns but also to detect new conditions  early and prevent illness, disease and health-related problems.    Medicare offers a yearly Wellness Visit which allows our clinical staff to assess your need for preventative services including immunizations, lifestyle education, counseling to decrease risk of preventable diseases and screening for fall risk and other medical concerns.    This visit is provided free of charge (no copay) for all Medicare recipients. The clinical pharmacists at [facility] have begun to conduct these Wellness Visits which will also include a thorough review of all your medications.    As you primary medical provider recommend that you make an appointment for your Annual Wellness Visit if you have not done so already this year.  You may set up this appointment before you leave today or you may call back [phone]) and schedule an appointment.  Please make sure when you call that you mention that you are scheduling your Annual Wellness Visit with the clinical pharmacist so that the appointment may be made for the proper length of time.     Continue current medications. Continue good therapeutic lifestyle changes which include good diet and exercise. Fall precautions discussed with patient. If an FOBT was given today- please return it  to our front desk. If you are over 30 years old - you may need Prevnar 55 or the adult Pneumonia vaccine.  **Flu shots are available--- please call and schedule a FLU-CLINIC appointment**  After your visit with [place] today you will receive a survey in the mail or online from [employer] regarding your care with [place]. Please take a moment to fill this out. Your feedback is very important to [place] as you can help [place] better understand your patient needs as well as improve your experience and satisfaction. WE CARE ABOUT YOU!!!   Continue to walk and exercise but always be careful and do not put yourself at risk for falling because of your previous fractures Continue current  medicines as doing Continue follow-up with cardiology as planned   Arrie Senate MD

## 2015-11-02 LAB — CBC WITH DIFFERENTIAL/PLATELET
Basophils Absolute: 0 10*3/uL (ref 0.0–0.2)
Basos: 0 %
EOS (ABSOLUTE): 0.4 10*3/uL (ref 0.0–0.4)
EOS: 9 %
HEMATOCRIT: 37 % (ref 34.0–46.6)
HEMOGLOBIN: 12.7 g/dL (ref 11.1–15.9)
IMMATURE GRANS (ABS): 0 10*3/uL (ref 0.0–0.1)
Immature Granulocytes: 0 %
Lymphocytes Absolute: 1.7 10*3/uL (ref 0.7–3.1)
Lymphs: 34 %
MCH: 30 pg (ref 26.6–33.0)
MCHC: 34.3 g/dL (ref 31.5–35.7)
MCV: 87 fL (ref 79–97)
MONOCYTES: 8 %
Monocytes Absolute: 0.4 10*3/uL (ref 0.1–0.9)
NEUTROS ABS: 2.4 10*3/uL (ref 1.4–7.0)
Neutrophils: 49 %
Platelets: 195 10*3/uL (ref 150–379)
RBC: 4.24 x10E6/uL (ref 3.77–5.28)
RDW: 13.6 % (ref 12.3–15.4)
WBC: 4.8 10*3/uL (ref 3.4–10.8)

## 2015-11-02 LAB — BMP8+EGFR
BUN/Creatinine Ratio: 17 (ref 11–26)
BUN: 10 mg/dL (ref 8–27)
CO2: 25 mmol/L (ref 18–29)
CREATININE: 0.6 mg/dL (ref 0.57–1.00)
Calcium: 9.2 mg/dL (ref 8.7–10.3)
Chloride: 103 mmol/L (ref 96–106)
GFR calc non Af Amer: 93 mL/min/{1.73_m2} (ref >59)
GFR, EST AFRICAN AMERICAN: 108 mL/min/{1.73_m2} (ref >59)
Glucose: 92 mg/dL (ref 65–99)
Potassium: 4.3 mmol/L (ref 3.5–5.2)
Sodium: 143 mmol/L (ref 134–144)

## 2015-11-02 LAB — NMR, LIPOPROFILE
Cholesterol: 163 mg/dL (ref 100–199)
HDL Cholesterol by NMR: 103 mg/dL (ref >39)
HDL Particle Number: 33.8 umol/L (ref >=30.5)
LDL Particle Number: 433 nmol/L (ref <1000)
LDL Size: 21 nm (ref >20.5)
LDL-C: 51 mg/dL (ref 0–99)
LP-IR Score: <25 (ref <=45)
Small LDL Particle Number: <90 nmol/L (ref <=527)
Triglycerides by NMR: 45 mg/dL (ref 0–149)

## 2015-11-02 LAB — HEPATIC FUNCTION PANEL
ALBUMIN: 4.1 g/dL (ref 3.6–4.8)
ALK PHOS: 95 IU/L (ref 39–117)
ALT: 21 IU/L (ref 0–32)
AST: 28 IU/L (ref 0–40)
BILIRUBIN, DIRECT: 0.16 mg/dL (ref 0.00–0.40)
Bilirubin Total: 0.5 mg/dL (ref 0.0–1.2)
Total Protein: 6.6 g/dL (ref 6.0–8.5)

## 2015-11-02 LAB — THYROID PANEL WITH TSH
Free Thyroxine Index: 3.8 (ref 1.2–4.9)
T3 Uptake Ratio: 34 % (ref 24–39)
T4 TOTAL: 11.3 ug/dL (ref 4.5–12.0)
TSH: 0.013 u[IU]/mL — ABNORMAL LOW (ref 0.450–4.500)

## 2015-11-02 LAB — VITAMIN D 25 HYDROXY (VIT D DEFICIENCY, FRACTURES): VIT D 25 HYDROXY: 45 ng/mL (ref 30.0–100.0)

## 2015-11-04 LAB — HM MAMMOGRAPHY

## 2015-11-10 ENCOUNTER — Encounter: Payer: Self-pay | Admitting: *Deleted

## 2015-11-17 ENCOUNTER — Ambulatory Visit (INDEPENDENT_AMBULATORY_CARE_PROVIDER_SITE_OTHER): Payer: PPO | Admitting: Pediatrics

## 2015-11-17 ENCOUNTER — Encounter: Payer: Self-pay | Admitting: Pediatrics

## 2015-11-17 VITALS — BP 121/57 | HR 109 | Temp 98.7°F | Ht 66.0 in | Wt 150.2 lb

## 2015-11-17 DIAGNOSIS — R059 Cough, unspecified: Secondary | ICD-10-CM

## 2015-11-17 DIAGNOSIS — J069 Acute upper respiratory infection, unspecified: Secondary | ICD-10-CM

## 2015-11-17 DIAGNOSIS — R05 Cough: Secondary | ICD-10-CM | POA: Diagnosis not present

## 2015-11-17 MED ORDER — HYDROCODONE-HOMATROPINE 5-1.5 MG/5ML PO SYRP: 5.0000 mL | ORAL_SOLUTION | Freq: Every evening | ORAL | Status: DC | PRN

## 2015-11-17 NOTE — Progress Notes (Signed)
Subjective:    Patient ID: [name], female    DOB: 01-31-1946, 70 y.o.   MRN: [id number]  CC: Nasal Congestion; Cough; and Sinus Pressure   HPI: [name] is a 70 y.o. female presenting for Nasal Congestion; Cough; and Sinus Pressure  Started dry cough 5 days ago. Has been getting worse since then. Not sleeping well because of the cough. Felt warm a few times at home, didn't check temperature Has been congested Nose dripping Took claritin (half a pill) last night Helped with the dripping some Has some headaches Using nasal spray, using gel in nose at night    Depression screen [facility] 2/9 11/17/2015 11/01/2015 06/15/2015 01/31/2015 09/27/2014  Decreased Interest 0 0 0 0 0  Down, Depressed, Hopeless 0 0 0 0 0  PHQ - 2 Score 0 0 0 0 0     Relevant past medical, surgical, family and social history reviewed and updated as indicated. Interim medical history since our last visit reviewed. Allergies and medications reviewed and updated.    ROS: Per HPI unless specifically indicated above  History  Smoking status  . Never Smoker   Smokeless tobacco  . Not on file    Comment: no tobacco     Past Medical History Patient Active Problem List   Diagnosis Date Noted  . History of pheochromocytoma 02/17/2014  . Cold intolerance 02/17/2014  . Vitamin D deficiency 10/21/2013  . 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 hypothyroidism 10/21/2013  . Thyroid cancer ([place]) 06/18/2013  . High risk medication use 06/18/2013  . Sinus tachycardia ([place]) 07/06/2012  . Hypokalemia 07/06/2012  . Pheochromocytoma, history of 05/21/2012  . Hypertension 04/10/2010  . Atrial fibrillation ([place]) 04/04/2010  . Mitral valve disorder 03/28/2010  . 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 Artificial Tear Ointment (REFRESH LACRI-LUBE) OINT Apply 1 inch to eye at bedtime. Apply to bottom lid    . aspirin 325  MG EC tablet Take 325 mg by mouth daily.     . Cholecalciferol (VITAMIN D3) 2000 UNITS TABS Take 1 capsule by mouth daily.     . flecainide (TAMBOCOR) 50 MG tablet TAKE 1 TABLET (50 MG TOTAL) BY MOUTH 2 (TWO) TIMES DAILY. 60 tablet 10  . loratadine (CLARITIN) 10 MG tablet Take 10 mg by mouth daily as needed.     . Multiple Vitamins-Minerals (CENTRUM SILVER) tablet Take 1 tablet by mouth every morning.     . Multiple Vitamins-Minerals (PRESERVISION/LUTEIN PO) Take 1 capsule by mouth daily.    . Omega-3 Fatty Acids (FISH OIL) 1200 MG CAPS Take 1 capsule by mouth daily.    Marland Kitchen SYNTHROID 150 MCG tablet TAKE 1 TABLET (150 MCG TOTAL) BY MOUTH AS DIRECTED. 30 tablet 9  . HYDROcodone-homatropine (HYCODAN) 5-1.5 MG/5ML syrup Take 5 mLs by mouth at bedtime as needed for cough. 120 mL 0   No current facility-administered medications for this visit.       Objective:    BP 121/57 mmHg  Pulse 109  Temp(Src) 98.7 F (37.1 C) (Oral)  Ht 5\' 6"  (1.676 m)  Wt 150 lb 3.2 oz (68.13 kg)  BMI 24.25 kg/m2  Wt Readings from Last 3 Encounters:  11/17/15 150 lb 3.2 oz (68.13 kg)  11/01/15 150 lb (68.04 kg)  06/15/15 147 lb (66.679 kg)     Gen: NAD, alert, cooperative with exam, NCAT, congested EYES: EOMI, no scleral injection or icterus ENT:  TMs slightly dull gray b/l, OP with mild erythema LYMPH: no cervical LAD CV: NRRR, normal S1/S2, no murmur, distal pulses 2+ b/l Resp: CTABL, no wheezes, normal WOB Abd: +BS, soft, NTND. no guarding or organomegaly Ext: No edema, warm Neuro: Alert and oriented, strength equal b/l UE and LE, coordination grossly normal MSK: normal muscle bulk     Assessment & Plan:    [name] was seen today for nasal congestion, cough and sinus pressure, has acute URI, no indication for antibiotics at this time. Discussed symptomatic care. Trial hycodan for cough at night. Return precautions given.  Diagnoses and all orders for this visit:  Acute URI  Cough -      HYDROcodone-homatropine (HYCODAN) 5-1.5 MG/5ML syrup; Take 5 mLs by mouth at bedtime as needed for cough.   Follow up plan: Return if symptoms worsen or fail to improve.  Assunta Found, MD [facility] 11/17/2015, 9:55 AM

## 2015-11-17 NOTE — Patient Instructions (Addendum)
Continue nasal sprays Rest Aspirin Honey for cough during day

## 2015-12-01 DIAGNOSIS — Z8619 Personal history of other infectious and parasitic diseases: Secondary | ICD-10-CM | POA: Diagnosis not present

## 2015-12-01 DIAGNOSIS — H348112 Central retinal vein occlusion, right eye, stable: Secondary | ICD-10-CM | POA: Diagnosis not present

## 2015-12-01 DIAGNOSIS — H2512 Age-related nuclear cataract, left eye: Secondary | ICD-10-CM | POA: Diagnosis not present

## 2015-12-01 DIAGNOSIS — H35043 Retinal micro-aneurysms, unspecified, bilateral: Secondary | ICD-10-CM | POA: Diagnosis not present

## 2015-12-01 DIAGNOSIS — H43812 Vitreous degeneration, left eye: Secondary | ICD-10-CM | POA: Diagnosis not present

## 2016-01-13 ENCOUNTER — Other Ambulatory Visit: Payer: Self-pay | Admitting: Family Medicine

## 2016-02-07 NOTE — Progress Notes (Signed)
HPI The patient presents for follow up of atrial fib.   She has had surgery for treatment of  pheochromocytoma.  She has moderate tricuspid regurgitation with no evidence of pulmonary hypertension.  Since I last saw her she has done well.  The patient denies any new symptoms such as chest discomfort, neck or arm discomfort. There has been no new shortness of breath, PND or orthopnea. She denies any lower extremity edema. There have been no reported palpitations, presyncope or syncope.  She stays very active doing housework which includes every week carrying in heavy bags of cat litter.  However she doesn't exercise routinely.   Allergies  Allergen Reactions  . Penicillins Anaphylaxis  . Actonel [Risedronate Sodium]     Nausea and worsened heart burn  . Ceclor [Cefaclor] Hives  . Acetaminophen Nausea And Vomiting and Other (See Comments)    Headache  . Alendronate Sodium Nausea Only and Other (See Comments)    Heartburn, stomach upset  . Celecoxib Other (See Comments)    Stomach upset, heart burn  . Codeine Nausea And Vomiting and Other (See Comments)    Headache  . Ventolin [Kdc:Albuterol] Palpitations    Rapid heart beat    Current Outpatient Prescriptions  Medication Sig Dispense Refill  . Artificial Tear Ointment (REFRESH LACRI-LUBE) OINT Apply 1 inch to eye at bedtime. Apply to bottom lid    . aspirin 325 MG EC tablet Take 325 mg by mouth daily.     . Cholecalciferol (VITAMIN D3) 2000 UNITS TABS Take 1 capsule by mouth daily.     . flecainide (TAMBOCOR) 50 MG tablet TAKE 1 TABLET (50 MG TOTAL) BY MOUTH 2 (TWO) TIMES DAILY. 60 tablet 10  . loratadine (CLARITIN) 10 MG tablet Take 10 mg by mouth daily as needed for allergies.     . Multiple Vitamins-Minerals (CENTRUM SILVER) tablet Take 1 tablet by mouth every morning.     . Multiple Vitamins-Minerals (PRESERVISION/LUTEIN PO) Take 1 capsule by mouth daily.    . Omega-3 Fatty Acids (FISH OIL) 1200 MG CAPS Take 1 capsule by mouth  daily.    Marland Kitchen SYNTHROID 150 MCG tablet TAKE 1 TABLET (150 MCG TOTAL) BY MOUTH AS DIRECTED. 30 tablet 8   No current facility-administered medications for this visit.    Past Medical History  Diagnosis Date  . Palpitations JUNE 2012    piror to diagnosis of SVT  -NO PROBLMS WITH REOCCURANCE  . Mitral valve prolapse     per pt report, not listed on echo  . MR (mitral regurgitation)     none on echo in 2013  . GERD (gastroesophageal reflux disease)   . Pheochromocytoma   . Blurry vision, right eye     HX OF RT EYE VISION DISTURBANCE 01/2012 -PT SEEN BY DR. RANKIN--ELEVATED B/P  THOUGHT TO BE RELATED TO [place] SPOTTED FEVER THOUGHT TO BE CAUSE OF VISION PROBLEM-HAD LASER OF BOTH EYES (SLIGHT DAMAGE TO LEFT  EYE).    . Atrial fibrillation (HCC)     PT ON FLECANIDE -DID NOT WANT TO BE ON BLOOD THINNERS OTHER THAN DAILY ASA  . Hypertension     B/P HAS BEEN UP AND DOWN BUT MOSTLY DOWN WHILE ON METOPROLOL -WAS TAKEN OFF METOROLOL IN NOV 2012 BY CARDIOLOGIST AND THEN IN MARCH 2013 PT'S B/P BECAME REALLY ELEVATED-SHE RESTARTED METOPROLOL  AND WAS GIVEN BENICAR.  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 Hypothyroidism   . PONV (  postoperative nausea and vomiting)     HX OF N&V AFTER SURGERIES-EXCEPT NO PROBLEM AFTER WRIST SURGERY IN 2012-AT [facility]  . Headache(784.0)     PAST HX MIGRAINES  . Vegetarian diet     DOES EAT EGGS AND DAIRY--NO MEATS OR FISH  . TMJ locking     PT STATES IF MOUTH OPEN EXTENDED TIME-LIKE AT DENTIST--HER JAWS LOCK  . Cataract     BILATERAL  . Hearing loss of right ear   . Osteoarthritis     LITTLE FINGER LEFT HAND  . Thyroid cancer [facility])     age 25; s/p resection with subsequent hypothyroidism. 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 TR (tricuspid regurgitation)     Past Surgical History  Procedure Laterality Date  . Thyroidectomy    . Lung biopsies    . Appendectomy    . Wrist surgery    . Adrenalectomy   07/03/2012    Procedure: ADRENALECTOMY;  Surgeon: Dutch Gray, MD;  Location: [facility];  Service: Urology;  Laterality: Right;    ROS:  As stated in the HPI and negative for all other systems.  PHYSICAL EXAM BP 110/64 mmHg  Pulse 80  Ht 5\' 6"  (1.676 m)  Wt 144 lb (65.318 kg)  BMI 23.25 kg/m2 GENERAL:  Well appearing NECK:  No jugular venous distention, waveform within normal limits, carotid upstroke brisk and symmetric, no bruits, no thyromegaly, healed thyroidectomy scar LUNGS:  Clear to auscultation bilaterally CHEST:  Unremarkable HEART:  PMI not displaced or sustained,S1 and S2 within normal limits, no S3, no S4, no clicks, no rubs, no murmurs ABD:  Flat, positive bowel sounds normal in frequency in pitch, no bruits, no rebound, no guarding, no midline pulsatile mass, no hepatomegaly, no splenomegaly, healed abdominal scar EXT:  2 plus pulses throughout, no edema, no cyanosis no clubbing, varicose veins   EKG: Sinus rhythm, rate 72 , left ventricular hypertrophy by voltage criteria, repolarization changes, no acute ST-T wave changes. 02/08/2016   ASSESSMENT AND PLAN   HYPERTENSION -  Her blood pressure is normal. No change in therapy is indicated.   ATRIAL FIBRILLATION -  She has had no symptomatic or documented paroxysms of her atrial fibrillation since being on flecainide.  She has had appropriate follow up with a POET (Plain Old Exercise Treadmill) and drug level.  Of note she is not on any AV nodal blocking agent but she has been intolerant of these medicines and does not want to take the calcium blocker or beta blocker.  TRICUSPID REGURGITATION - This is not clinically causing any issues.  It has been four years since the last echo.  I will repeat this.

## 2016-02-08 ENCOUNTER — Ambulatory Visit (INDEPENDENT_AMBULATORY_CARE_PROVIDER_SITE_OTHER): Payer: PPO | Admitting: Cardiology

## 2016-02-08 ENCOUNTER — Encounter: Payer: Self-pay | Admitting: Cardiology

## 2016-02-08 VITALS — BP 110/64 | HR 80 | Ht 66.0 in | Wt 144.0 lb

## 2016-02-08 DIAGNOSIS — I059 Rheumatic mitral valve disease, unspecified: Secondary | ICD-10-CM | POA: Diagnosis not present

## 2016-02-08 DIAGNOSIS — I10 Essential (primary) hypertension: Secondary | ICD-10-CM | POA: Diagnosis not present

## 2016-02-08 NOTE — Patient Instructions (Signed)
Medication Instructions:  The current medical regimen is effective;  continue present plan and medications.  Labwork: None  Testing/Procedures: Your physician has requested that you have an echocardiogram. Echocardiography is a painless test that uses sound waves to create images of your heart. It provides your doctor with information about the size and shape of your heart and how well your heart's chambers and valves are working. This procedure takes approximately one hour. There are no restrictions for this procedure.  Follow-Up: Follow up in 1 year with Dr. Percival Spanish.  You will receive a letter in the mail 2 months before you are due.  Please call us when you receive this letter to schedule your follow up appointment.  If you need a refill on your cardiac medications before your next appointment, please call your pharmacy.  Thank you for choosing [facility]!!

## 2016-02-13 ENCOUNTER — Other Ambulatory Visit: Payer: Self-pay | Admitting: Cardiology

## 2016-02-13 NOTE — Telephone Encounter (Signed)
REFILL 

## 2016-02-24 ENCOUNTER — Other Ambulatory Visit: Payer: Self-pay

## 2016-02-24 ENCOUNTER — Ambulatory Visit (HOSPITAL_COMMUNITY): Payer: PPO | Attending: Cardiology

## 2016-02-24 DIAGNOSIS — I509 Heart failure, unspecified: Secondary | ICD-10-CM | POA: Diagnosis not present

## 2016-02-24 DIAGNOSIS — I358 Other nonrheumatic aortic valve disorders: Secondary | ICD-10-CM | POA: Insufficient documentation

## 2016-02-24 DIAGNOSIS — I340 Nonrheumatic mitral (valve) insufficiency: Secondary | ICD-10-CM | POA: Diagnosis not present

## 2016-02-24 DIAGNOSIS — I341 Nonrheumatic mitral (valve) prolapse: Secondary | ICD-10-CM | POA: Insufficient documentation

## 2016-02-24 DIAGNOSIS — I071 Rheumatic tricuspid insufficiency: Secondary | ICD-10-CM | POA: Diagnosis not present

## 2016-02-24 DIAGNOSIS — I10 Essential (primary) hypertension: Secondary | ICD-10-CM | POA: Diagnosis not present

## 2016-02-24 DIAGNOSIS — I059 Rheumatic mitral valve disease, unspecified: Secondary | ICD-10-CM

## 2016-03-18 ENCOUNTER — Other Ambulatory Visit: Payer: Self-pay | Admitting: Cardiology

## 2016-03-19 NOTE — Telephone Encounter (Signed)
Rx request sent to pharmacy.  

## 2016-03-26 ENCOUNTER — Encounter: Payer: Self-pay | Admitting: Family Medicine

## 2016-03-26 ENCOUNTER — Ambulatory Visit (INDEPENDENT_AMBULATORY_CARE_PROVIDER_SITE_OTHER): Payer: PPO

## 2016-03-26 ENCOUNTER — Ambulatory Visit (INDEPENDENT_AMBULATORY_CARE_PROVIDER_SITE_OTHER): Payer: PPO | Admitting: Family Medicine

## 2016-03-26 DIAGNOSIS — Z1211 Encounter for screening for malignant neoplasm of colon: Secondary | ICD-10-CM

## 2016-03-26 DIAGNOSIS — Z8639 Personal history of other endocrine, nutritional and metabolic disease: Secondary | ICD-10-CM | POA: Diagnosis not present

## 2016-03-26 DIAGNOSIS — Z86018 Personal history of other benign neoplasm: Secondary | ICD-10-CM

## 2016-03-26 DIAGNOSIS — I10 Essential (primary) hypertension: Secondary | ICD-10-CM

## 2016-03-26 DIAGNOSIS — E559 Vitamin D deficiency, unspecified: Secondary | ICD-10-CM | POA: Diagnosis not present

## 2016-03-26 DIAGNOSIS — M810 Age-related osteoporosis without current pathological fracture: Secondary | ICD-10-CM | POA: Diagnosis not present

## 2016-03-26 DIAGNOSIS — C73 Malignant neoplasm of thyroid gland: Secondary | ICD-10-CM | POA: Diagnosis not present

## 2016-03-26 DIAGNOSIS — M79672 Pain in left foot: Secondary | ICD-10-CM | POA: Diagnosis not present

## 2016-03-26 DIAGNOSIS — Z85850 Personal history of malignant neoplasm of thyroid: Secondary | ICD-10-CM

## 2016-03-26 DIAGNOSIS — E785 Hyperlipidemia, unspecified: Secondary | ICD-10-CM

## 2016-03-26 LAB — URINALYSIS, COMPLETE
BILIRUBIN UA: NEGATIVE
Glucose, UA: NEGATIVE
KETONES UA: NEGATIVE
LEUKOCYTES UA: NEGATIVE
NITRITE UA: NEGATIVE
PH UA: 5 (ref 5.0–7.5)
Protein, UA: NEGATIVE
SPEC GRAV UA: 1.015 (ref 1.005–1.030)
UUROB: 0.2 mg/dL (ref 0.2–1.0)

## 2016-03-26 LAB — MICROSCOPIC EXAMINATION: Bacteria, UA: NONE SEEN

## 2016-03-26 NOTE — Patient Instructions (Addendum)
Continue current medications. Continue good therapeutic lifestyle changes which include good diet and exercise. Fall precautions discussed with patient. If an FOBT was given today- please return it to our front desk. If you are over 70 years old - you may need Prevnar 8 or the adult Pneumonia vaccine.  Flu Shots will be available at our office starting mid- September. Please call and schedule a FLU CLINIC APPOINTMENT.                        Medicare Annual Wellness Visit  [facility] and the medical providers at [place] [facility] strive to bring you the best medical care.  In doing so we not only want to address your current medical conditions and concerns but also to detect new conditions early and prevent illness, disease and health-related problems.    Medicare offers a yearly Wellness Visit which allows our clinical staff to assess your need for preventative services including immunizations, lifestyle education, counseling to decrease risk of preventable diseases and screening for fall risk and other medical concerns.    This visit is provided free of charge (no copay) for all Medicare recipients. The clinical pharmacists at [facility] have begun to conduct these Wellness Visits which will also include a thorough review of all your medications.    As you primary medical provider recommend that you make an appointment for your Annual Wellness Visit if you have not done so already this year.  You may set up this appointment before you leave today or you may call back [phone] and schedule an appointment.  Please make sure when you call that you mention that you are scheduling your Annual Wellness Visit with the clinical pharmacist so that the appointment may be made for the proper length of time.    We will call you with the results of the foot x-ray is soon as those results become available We will also call with results of the DEXA scan Please  return the FOBT We will arrange for you to have a visit with [facility] orthopedics to further evaluate the left foot pain especially with a history of the cyst that you've had in the past.

## 2016-03-26 NOTE — Progress Notes (Signed)
Subjective:    Patient ID: [name], female    DOB: 1946-02-16, 70 y.o.   MRN: [id number]  HPI  Patient is here today for a 4 month follow up on her hypertension, hypothyroidism, and vitamin d deficiency. Patient is also complaining with left foot pain for a couple of years that is gradually getting worse. Patient has a history of a pheochromocytoma. She has had surgery for this. She had a lot of associated problems with this. She seems to be doing better regarding her heart and blood pressure. She also has a remote history of thyroid cancer and remains hyperthyroid with taking medication for this. She is due to get a DEXA scan and is in need of a Pap smear and pelvic exam. She will be given an FOBT to return and will get lab work today. She is has some left foot pain for a couple years.  Review of Systems  Constitutional: Negative.   HENT: Negative.   Eyes: Negative.   Respiratory: Negative.   Cardiovascular: Negative.   Gastrointestinal: Negative.   Endocrine: Negative.   Genitourinary: Negative.   Musculoskeletal:       Left foot pain.   Skin: Negative.   Allergic/Immunologic: Negative.   Neurological: Negative.   Hematological: Negative.   Psychiatric/Behavioral: Negative.          Patient Active Problem List   Diagnosis Date Noted  . History of pheochromocytoma 02/17/2014  . Cold intolerance 02/17/2014  . Vitamin D deficiency 10/21/2013  . 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 hypothyroidism 10/21/2013  . Thyroid cancer ([place]) 06/18/2013  . High risk medication use 06/18/2013  . Sinus tachycardia ([place]) 07/06/2012  . Hypokalemia 07/06/2012  . Pheochromocytoma, history of 05/21/2012  . Hypertension 04/10/2010  . Atrial fibrillation ([place]) 04/04/2010  . Mitral valve disorder 03/28/2010  . 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 Artificial Tear Ointment (REFRESH LACRI-LUBE) OINT  Apply 1 inch to eye at bedtime. Apply to bottom lid  . aspirin 325 MG EC tablet Take 325 mg by mouth daily.   . Cholecalciferol (VITAMIN D3) 2000 UNITS TABS Take 1 capsule by mouth daily.   . flecainide (TAMBOCOR) 50 MG tablet Take 1 tablet (50 mg total) by mouth 2 (two) times daily.  Marland Kitchen loratadine (CLARITIN) 10 MG tablet Take 10 mg by mouth daily as needed for allergies.   . Multiple Vitamins-Minerals (CENTRUM SILVER) tablet Take 1 tablet by mouth every morning.   . Multiple Vitamins-Minerals (PRESERVISION/LUTEIN PO) Take 1 capsule by mouth daily.  . Omega-3 Fatty Acids (FISH OIL) 1200 MG CAPS Take 1 capsule by mouth daily.  Marland Kitchen SYNTHROID 150 MCG tablet TAKE 1 TABLET (150 MCG TOTAL) BY MOUTH AS DIRECTED.   No facility-administered encounter medications on file as of 03/26/2016.       Objective:   Physical Exam  Constitutional: She is oriented to person, place, and time. She appears well-developed and well-nourished. No distress.  The patient is pleasant and alert.  HENT:  Head: Normocephalic and atraumatic.  Right Ear: External ear normal.  Left Ear: External ear normal.  Nose: Nose normal.  Mouth/Throat: Oropharyngeal exudate present.  There is some drainage in the posterior throat which is colored. The patient is aware of this. She's been using nasal saline and we'll continue to do this on guarding with warm salty water. She exhibits this drainage and congestion to the cats she has at home.  Eyes:  Conjunctivae and EOM are normal. Pupils are equal, round, and reactive to light. Right eye exhibits no discharge. Left eye exhibits no discharge. No scleral icterus.  Neck: Normal range of motion. Neck supple. No thyromegaly present.  No bruits thyromegaly or thyroid masses  Cardiovascular: Normal rate, regular rhythm, normal heart sounds and intact distal pulses.   No murmur heard. Heart is regular at 72/m  Pulmonary/Chest: Effort normal and breath sounds normal. No respiratory distress. She  has no wheezes. She has no rales. She exhibits no tenderness.  Clear anteriorly and posteriorly  Abdominal: Soft. Bowel sounds are normal. She exhibits no mass. There is no tenderness. There is no rebound and no guarding.  No abdominal tenderness or organ enlargement or masses or bruits  Musculoskeletal: Normal range of motion. She exhibits no edema.  Lymphadenopathy:    She has no cervical adenopathy.  Neurological: She is alert and oriented to person, place, and time. She has normal reflexes. No cranial nerve deficit.  Skin: Skin is warm and dry. No rash noted.  Psychiatric: She has a normal mood and affect. Her behavior is normal. Judgment and thought content normal.  Nursing note and vitals reviewed.   BP 94/61 mmHg  Pulse 79  Temp(Src) 96.9 F (36.1 C) (Oral)  Ht 5' 6"  (1.676 m)  Wt 140 lb 6.4 oz (63.685 kg)  BMI 22.67 kg/m2  [facility] reading (PRIMARY) by  Dr. Louretta Parma foot films with results pending                                      Assessment & Plan:  1. Hypertension -The blood pressure is good today and patient will continue with sodium restriction and weight loss. - CBC with Differential/Platelet - BMP8+EGFR - Urinalysis, Complete  2. Vitamin D deficiency -Continue with calcium and vitamin D replacement - VITAMIN D 25 Hydroxy (Vit-D Deficiency, Fractures)  3. Hyperlipemia -Continue with aggressive therapeutic lifestyle changes - NMR, lipoprofile - Hepatic function panel  4. Hx of thyroid cancer -Continue with thyroid replacement - Thyroid Panel With TSH - Cortisol  5. Osteoporosis, post-menopausal -Continue to be careful do not put yourself at risk for falling - DG [facility] DEXA  6. Special screening for malignant neoplasms, colon - Fecal occult blood, imunochemical; Future  7. Thyroid cancer ([place]) -Continue with current treatment and periodic follow-up with Dr. Nicoletta Dress, the endocrinologist  8. History of pheochromocytoma -This is been surgically removed  and the patient is doing much better as far as palpitations and she continues to follow-up with her cardiologist.  9. Left foot pain -We'll get x-rays today because the history of the cyst in the foot and the tenderness in the dorsal foot she will be referred to orthopedics for follow-up and possible MRI - DG Foot Complete Left; Future - Ambulatory referral to Orthopedic Surgery  Patient Instructions  Continue current medications. Continue good therapeutic lifestyle changes which include good diet and exercise. Fall precautions discussed with patient. If an FOBT was given today- please return it to our front desk. If you are over 60 years old - you may need Prevnar 50 or the adult Pneumonia vaccine.  Flu Shots will be available at our office starting mid- September. Please call and schedule a FLU CLINIC APPOINTMENT.                        Medicare Annual Wellness Visit   and the medical providers at [facility] strive to bring you the best medical care.  In doing so we not only want to address your current medical conditions and concerns but also to detect new conditions early and prevent illness, disease and health-related problems.    Medicare offers a yearly Wellness Visit which allows our clinical staff to assess your need for preventative services including immunizations, lifestyle education, counseling to decrease risk of preventable diseases and screening for fall risk and other medical concerns.    This visit is provided free of charge (no copay) for all Medicare recipients. The clinical pharmacists at [facility] have begun to conduct these Wellness Visits which will also include a thorough review of all your medications.    As you primary medical provider recommend that you make an appointment for your Annual Wellness Visit if you have not done so already this year.  You may set up this appointment before you leave today or  you may call back [phone]) and schedule an appointment.  Please make sure when you call that you mention that you are scheduling your Annual Wellness Visit with the clinical pharmacist so that the appointment may be made for the proper length of time.    We will call you with the results of the foot x-ray is soon as those results become available We will also call with results of the DEXA scan Please return the FOBT We will arrange for you to have a visit with [facility] orthopedics to further evaluate the left foot pain especially with a history of the cyst that you've had in the past.   Arrie Senate MD

## 2016-03-27 LAB — VITAMIN D 25 HYDROXY (VIT D DEFICIENCY, FRACTURES): Vit D, 25-Hydroxy: 51.6 ng/mL (ref 30.0–100.0)

## 2016-03-27 LAB — CBC WITH DIFFERENTIAL/PLATELET
BASOS ABS: 0 10*3/uL (ref 0.0–0.2)
BASOS: 0 %
EOS (ABSOLUTE): 0.4 10*3/uL (ref 0.0–0.4)
Eos: 7 %
HEMATOCRIT: 40.1 % (ref 34.0–46.6)
HEMOGLOBIN: 13.5 g/dL (ref 11.1–15.9)
IMMATURE GRANS (ABS): 0 10*3/uL (ref 0.0–0.1)
Immature Granulocytes: 0 %
LYMPHS ABS: 1.7 10*3/uL (ref 0.7–3.1)
Lymphs: 29 %
MCH: 29.7 pg (ref 26.6–33.0)
MCHC: 33.7 g/dL (ref 31.5–35.7)
MCV: 88 fL (ref 79–97)
MONOCYTES: 9 %
Monocytes Absolute: 0.5 10*3/uL (ref 0.1–0.9)
NEUTROS ABS: 3.2 10*3/uL (ref 1.4–7.0)
Neutrophils: 55 %
Platelets: 188 10*3/uL (ref 150–379)
RBC: 4.55 x10E6/uL (ref 3.77–5.28)
RDW: 13.6 % (ref 12.3–15.4)
WBC: 5.8 10*3/uL (ref 3.4–10.8)

## 2016-03-27 LAB — HEPATIC FUNCTION PANEL
ALK PHOS: 86 IU/L (ref 39–117)
ALT: 20 IU/L (ref 0–32)
AST: 22 IU/L (ref 0–40)
Albumin: 4.3 g/dL (ref 3.5–4.8)
Bilirubin Total: 0.5 mg/dL (ref 0.0–1.2)
Bilirubin, Direct: 0.18 mg/dL (ref 0.00–0.40)
TOTAL PROTEIN: 7.1 g/dL (ref 6.0–8.5)

## 2016-03-27 LAB — THYROID PANEL WITH TSH
Free Thyroxine Index: 3.5 (ref 1.2–4.9)
T3 Uptake Ratio: 33 % (ref 24–39)
T4 TOTAL: 10.7 ug/dL (ref 4.5–12.0)
TSH: 0.006 u[IU]/mL — ABNORMAL LOW (ref 0.450–4.500)

## 2016-03-27 LAB — BMP8+EGFR
BUN / CREAT RATIO: 27 (ref 12–28)
BUN: 15 mg/dL (ref 8–27)
CO2: 23 mmol/L (ref 18–29)
CREATININE: 0.55 mg/dL — AB (ref 0.57–1.00)
Calcium: 9.5 mg/dL (ref 8.7–10.3)
Chloride: 101 mmol/L (ref 96–106)
GFR calc Af Amer: 110 mL/min/{1.73_m2} (ref >59)
GFR calc non Af Amer: 95 mL/min/{1.73_m2} (ref >59)
GLUCOSE: 88 mg/dL (ref 65–99)
Potassium: 5 mmol/L (ref 3.5–5.2)
SODIUM: 141 mmol/L (ref 134–144)

## 2016-03-27 LAB — NMR, LIPOPROFILE
Cholesterol: 163 mg/dL (ref 100–199)
HDL CHOLESTEROL BY NMR: 104 mg/dL (ref >39)
HDL PARTICLE NUMBER: 34.4 umol/L (ref >=30.5)
LDL PARTICLE NUMBER: 537 nmol/L (ref <1000)
LDL Size: 21 nm (ref >20.5)
LDL-C: 52 mg/dL (ref 0–99)
Triglycerides by NMR: 35 mg/dL (ref 0–149)

## 2016-03-27 LAB — CORTISOL: Cortisol: 17.9 ug/dL

## 2016-08-13 ENCOUNTER — Encounter: Payer: Self-pay | Admitting: Family Medicine

## 2016-08-13 ENCOUNTER — Ambulatory Visit (INDEPENDENT_AMBULATORY_CARE_PROVIDER_SITE_OTHER): Payer: PPO | Admitting: Family Medicine

## 2016-08-13 VITALS — BP 103/58 | HR 79 | Temp 97.1°F | Ht 66.0 in | Wt 140.0 lb

## 2016-08-13 DIAGNOSIS — E559 Vitamin D deficiency, unspecified: Secondary | ICD-10-CM

## 2016-08-13 DIAGNOSIS — E784 Other hyperlipidemia: Secondary | ICD-10-CM | POA: Diagnosis not present

## 2016-08-13 DIAGNOSIS — Z23 Encounter for immunization: Secondary | ICD-10-CM

## 2016-08-13 DIAGNOSIS — I10 Essential (primary) hypertension: Secondary | ICD-10-CM

## 2016-08-13 DIAGNOSIS — C73 Malignant neoplasm of thyroid gland: Secondary | ICD-10-CM

## 2016-08-13 DIAGNOSIS — Z86018 Personal history of other benign neoplasm: Secondary | ICD-10-CM | POA: Diagnosis not present

## 2016-08-13 DIAGNOSIS — M810 Age-related osteoporosis without current pathological fracture: Secondary | ICD-10-CM

## 2016-08-13 DIAGNOSIS — I480 Paroxysmal atrial fibrillation: Secondary | ICD-10-CM

## 2016-08-13 DIAGNOSIS — E7849 Other hyperlipidemia: Secondary | ICD-10-CM

## 2016-08-13 NOTE — Patient Instructions (Addendum)
Medicare Annual Wellness Visit  [facility] and the medical providers at [facility] strive to bring you the best medical care.  In doing so we not only want to address your current medical conditions and concerns but also to detect new conditions early and prevent illness, disease and health-related problems.    Medicare offers a yearly Wellness Visit which allows our clinical staff to assess your need for preventative services including immunizations, lifestyle education, counseling to decrease risk of preventable diseases and screening for fall risk and other medical concerns.    This visit is provided free of charge (no copay) for all Medicare recipients. The clinical pharmacists at [facility] have begun to conduct these Wellness Visits which will also include a thorough review of all your medications.    As you primary medical provider recommend that you make an appointment for your Annual Wellness Visit if you have not done so already this year.  You may set up this appointment before you leave today or you may call back [phone] and schedule an appointment.  Please make sure when you call that you mention that you are scheduling your Annual Wellness Visit with the clinical pharmacist so that the appointment may be made for the proper length of time.     Continue current medications. Continue good therapeutic lifestyle changes which include good diet and exercise. Fall precautions discussed with patient. If an FOBT was given today- please return it to our front desk. If you are over 17 years old - you may need Prevnar 30 or the adult Pneumonia vaccine.  **Flu shots are available--- please call and schedule a FLU-CLINIC appointment**  After your visit with [place] today you will receive a survey in the mail or online from [employer] regarding your care with [place]. Please take a moment to fill this out. Your feedback is very  important to [place] as you can help [place] better understand your patient needs as well as improve your experience and satisfaction. WE CARE ABOUT YOU!!!   Please return the FOBT card Continue to drink plenty of fluids and stay as active as possible Continue to follow-up with cardiology and endocrinology if needed This winter drink plenty of fluids and stay well hydrated The flu shot that you received today may make your arm sore

## 2016-08-13 NOTE — Progress Notes (Signed)
Subjective:    Patient ID: [name], female    DOB: 09-19-1946, 70 y.o.   MRN: [id number]  HPI Pt here for follow up and management of chronic medical problems which includes hypertension. She is taking medications regularly.The patient comes to the visit today and has no complaints specifically and needs no refills. She has a history of thyroid cancer atrial fibrillation and a remote history of pheochromocytoma. He'll get her flu shot today and lab work today. The patient denies any chest pain or shortness of breath. She denies any problem with her intestinal tract including nausea vomiting diarrhea blood in the stool or black tarry bowel movements. She is passing her water without problems. She continues to see the cardiologist and continues to take flecainide. She has not seen the endocrinologist recently and usually sees him only as needed.     Patient Active Problem List   Diagnosis Date Noted  . History of pheochromocytoma 02/17/2014  . Cold intolerance 02/17/2014  . Vitamin D deficiency 10/21/2013  . 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 Hypothyroidism 10/21/2013  . Thyroid cancer ([place]) 06/18/2013  . High risk medication use 06/18/2013  . Sinus tachycardia 07/06/2012  . Hypokalemia 07/06/2012  . Hypertension 04/10/2010  . Atrial fibrillation ([place]) 04/04/2010  . Mitral valve disorder 03/28/2010  . 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 Artificial Tear Ointment (REFRESH LACRI-LUBE) OINT Apply 1 inch to eye at bedtime. Apply to bottom lid  . aspirin 325 MG EC tablet Take 325 mg by mouth daily.   . Cholecalciferol (VITAMIN D3) 2000 UNITS TABS Take 1 capsule by mouth daily.   . flecainide (TAMBOCOR) 50 MG tablet Take 1 tablet (50 mg total) by mouth 2 (two) times daily.  Marland Kitchen loratadine (CLARITIN) 10 MG tablet Take 10 mg by mouth daily as needed for allergies.   . Multiple Vitamins-Minerals  (CENTRUM SILVER) tablet Take 1 tablet by mouth every morning.   . Multiple Vitamins-Minerals (PRESERVISION/LUTEIN PO) Take 1 capsule by mouth daily.  . Omega-3 Fatty Acids (FISH OIL) 1200 MG CAPS Take 1 capsule by mouth daily.  Marland Kitchen SYNTHROID 150 MCG tablet TAKE 1 TABLET (150 MCG TOTAL) BY MOUTH AS DIRECTED.   No facility-administered encounter medications on file as of 08/13/2016.       Review of Systems  Constitutional: Negative.   HENT: Negative.   Eyes: Negative.   Respiratory: Negative.   Cardiovascular: Negative.   Gastrointestinal: Negative.   Endocrine: Negative.   Genitourinary: Negative.   Musculoskeletal: Negative.   Skin: Negative.   Allergic/Immunologic: Negative.   Neurological: Negative.   Hematological: Negative.   Psychiatric/Behavioral: Negative.        Objective:   Physical Exam  Constitutional: She is oriented to person, place, and time. She appears well-developed and well-nourished.  Small framed and alert and pleasant  HENT:  Head: Normocephalic and atraumatic.  Right Ear: External ear normal.  Left Ear: External ear normal.  Nose: Nose normal.  Mouth/Throat: Oropharynx is clear and moist.  Eyes: Conjunctivae and EOM are normal. Pupils are equal, round, and reactive to light. Right eye exhibits no discharge. Left eye exhibits no discharge. No scleral icterus.  Neck: Normal range of motion. Neck supple. No thyromegaly present.  No bruits thyromegaly or anterior cervical adenopathy  Cardiovascular: Normal rate, regular rhythm, normal heart sounds and intact distal pulses.   No murmur heard. The heart has a regular rate and rhythm at  72/m  Pulmonary/Chest: Effort normal and breath sounds normal. No respiratory distress. She has no wheezes. She has no rales.  Clear anteriorly and posteriorly  Abdominal: Soft. Bowel sounds are normal. She exhibits no mass. There is no tenderness. There is no rebound and no guarding.  No liver or spleen enlargement. No  abdominal tenderness. No bruits.  Musculoskeletal: Normal range of motion. She exhibits no edema.  Lymphadenopathy:    She has no cervical adenopathy.  Neurological: She is alert and oriented to person, place, and time. She has normal reflexes. No cranial nerve deficit.  Skin: Skin is warm and dry. No rash noted.  Psychiatric: She has a normal mood and affect. Her behavior is normal. Judgment and thought content normal.  Nursing note and vitals reviewed.   BP (!) 103/58 (BP Location: Left Arm)   Pulse 79   Temp 97.1 F (36.2 C) (Oral)   Ht _0  (1.676 m)   Wt 140 lb (63.5 kg)   BMI 22.60 kg/m        Assessment & Plan:  1. Hypertension -The blood pressure remains good today. - BMP8+EGFR - CBC with Differential/Platelet - Hepatic function panel  2. Vitamin D deficiency -Continue current treatment pending results of lab work - CBC with Differential/Platelet - VITAMIN D 25 Hydroxy (Vit-D Deficiency, Fractures)  3. Other hyperlipidemia -Continue current treatment pending results of lab work and continue with aggressive therapeutic lifestyle changes - CBC with Differential/Platelet - NMR, lipoprofile  4. Thyroid cancer ([place]) -Continue current doses of thyroid medicine and when necessary visits with endocrinology  5. History of pheochromocytoma  6. Osteoporosis, post-menopausal -Continue with calcium and vitamin D and fall prevention  7. Paroxysmal atrial fibrillation ([facility]) -Continue to follow-up with cardiology  Patient Instructions                       Medicare Annual Wellness Visit  [facility] and the medical providers at [facility] strive to bring you the best medical care.  In doing so we not only want to address your current medical conditions and concerns but also to detect new conditions early and prevent illness, disease and health-related problems.    Medicare offers a yearly Wellness Visit which allows our clinical staff to assess  your need for preventative services including immunizations, lifestyle education, counseling to decrease risk of preventable diseases and screening for fall risk and other medical concerns.    This visit is provided free of charge (no copay) for all Medicare recipients. The clinical pharmacists at [facility] have begun to conduct these Wellness Visits which will also include a thorough review of all your medications.    As you primary medical provider recommend that you make an appointment for your Annual Wellness Visit if you have not done so already this year.  You may set up this appointment before you leave today or you may call back ([phone]) and schedule an appointment.  Please make sure when you call that you mention that you are scheduling your Annual Wellness Visit with the clinical pharmacist so that the appointment may be made for the proper length of time.     Continue current medications. Continue good therapeutic lifestyle changes which include good diet and exercise. Fall precautions discussed with patient. If an FOBT was given today- please return it to our front desk. If you are over 33 years old - you may need Prevnar 14 or the adult Pneumonia vaccine.  **  Flu shots are available--- please call and schedule a FLU-CLINIC appointment**  After your visit with [place] today you will receive a survey in the mail or online from [employer] regarding your care with [place]. Please take a moment to fill this out. Your feedback is very important to [place] as you can help [place] better understand your patient needs as well as improve your experience and satisfaction. WE CARE ABOUT YOU!!!   Please return the FOBT card Continue to drink plenty of fluids and stay as active as possible Continue to follow-up with cardiology and endocrinology if needed This winter drink plenty of fluids and stay well hydrated The flu shot that you received today may make your arm sore    Arrie Senate  MD

## 2016-08-14 LAB — BMP8+EGFR
BUN / CREAT RATIO: 23 (ref 12–28)
BUN: 14 mg/dL (ref 8–27)
CALCIUM: 9.2 mg/dL (ref 8.7–10.3)
CHLORIDE: 101 mmol/L (ref 96–106)
CO2: 27 mmol/L (ref 18–29)
Creatinine, Ser: 0.6 mg/dL (ref 0.57–1.00)
GFR calc Af Amer: 107 mL/min/{1.73_m2} (ref >59)
GFR, EST NON AFRICAN AMERICAN: 93 mL/min/{1.73_m2} (ref >59)
Glucose: 92 mg/dL (ref 65–99)
POTASSIUM: 4.5 mmol/L (ref 3.5–5.2)
SODIUM: 142 mmol/L (ref 134–144)

## 2016-08-14 LAB — HEPATIC FUNCTION PANEL
ALK PHOS: 86 IU/L (ref 39–117)
ALT: 19 IU/L (ref 0–32)
AST: 23 IU/L (ref 0–40)
Albumin: 4.3 g/dL (ref 3.5–4.8)
Bilirubin Total: 0.5 mg/dL (ref 0.0–1.2)
Bilirubin, Direct: 0.18 mg/dL (ref 0.00–0.40)
TOTAL PROTEIN: 7.1 g/dL (ref 6.0–8.5)

## 2016-08-14 LAB — CBC WITH DIFFERENTIAL/PLATELET
Basophils Absolute: 0 10*3/uL (ref 0.0–0.2)
Basos: 0 %
EOS (ABSOLUTE): 0.5 10*3/uL — AB (ref 0.0–0.4)
Eos: 8 %
HEMATOCRIT: 39 % (ref 34.0–46.6)
Hemoglobin: 13.2 g/dL (ref 11.1–15.9)
IMMATURE GRANULOCYTES: 0 %
Immature Grans (Abs): 0 10*3/uL (ref 0.0–0.1)
LYMPHS ABS: 1.6 10*3/uL (ref 0.7–3.1)
Lymphs: 28 %
MCH: 29.7 pg (ref 26.6–33.0)
MCHC: 33.8 g/dL (ref 31.5–35.7)
MCV: 88 fL (ref 79–97)
MONOS ABS: 0.5 10*3/uL (ref 0.1–0.9)
Monocytes: 9 %
NEUTROS PCT: 55 %
Neutrophils Absolute: 3.2 10*3/uL (ref 1.4–7.0)
PLATELETS: 185 10*3/uL (ref 150–379)
RBC: 4.44 x10E6/uL (ref 3.77–5.28)
RDW: 14 % (ref 12.3–15.4)
WBC: 5.8 10*3/uL (ref 3.4–10.8)

## 2016-08-14 LAB — NMR, LIPOPROFILE
Cholesterol: 171 mg/dL (ref 100–199)
HDL CHOLESTEROL BY NMR: 102 mg/dL (ref >39)
HDL Particle Number: 36.2 umol/L (ref >=30.5)
LDL PARTICLE NUMBER: 508 nmol/L (ref <1000)
LDL Size: 21.1 nm (ref >20.5)
LDL-C: 62 mg/dL (ref 0–99)
Small LDL Particle Number: <90 nmol/L (ref <=527)
Triglycerides by NMR: 35 mg/dL (ref 0–149)

## 2016-08-14 LAB — VITAMIN D 25 HYDROXY (VIT D DEFICIENCY, FRACTURES): Vit D, 25-Hydroxy: 47.7 ng/mL (ref 30.0–100.0)

## 2016-09-21 DIAGNOSIS — H26491 Other secondary cataract, right eye: Secondary | ICD-10-CM | POA: Diagnosis not present

## 2016-09-21 DIAGNOSIS — H04123 Dry eye syndrome of bilateral lacrimal glands: Secondary | ICD-10-CM | POA: Diagnosis not present

## 2016-09-21 DIAGNOSIS — H16213 Exposure keratoconjunctivitis, bilateral: Secondary | ICD-10-CM | POA: Diagnosis not present

## 2016-09-21 DIAGNOSIS — H353131 Nonexudative age-related macular degeneration, bilateral, early dry stage: Secondary | ICD-10-CM | POA: Diagnosis not present

## 2016-09-21 DIAGNOSIS — H31093 Other chorioretinal scars, bilateral: Secondary | ICD-10-CM | POA: Diagnosis not present

## 2016-09-21 DIAGNOSIS — Z961 Presence of intraocular lens: Secondary | ICD-10-CM | POA: Diagnosis not present

## 2016-10-15 ENCOUNTER — Other Ambulatory Visit: Payer: Self-pay | Admitting: Family Medicine

## 2016-12-03 ENCOUNTER — Encounter: Payer: Self-pay | Admitting: Family Medicine

## 2016-12-03 DIAGNOSIS — H35043 Retinal micro-aneurysms, unspecified, bilateral: Secondary | ICD-10-CM | POA: Diagnosis not present

## 2016-12-03 DIAGNOSIS — H348112 Central retinal vein occlusion, right eye, stable: Secondary | ICD-10-CM | POA: Diagnosis not present

## 2016-12-03 DIAGNOSIS — H2512 Age-related nuclear cataract, left eye: Secondary | ICD-10-CM | POA: Diagnosis not present

## 2016-12-03 DIAGNOSIS — Z8619 Personal history of other infectious and parasitic diseases: Secondary | ICD-10-CM | POA: Diagnosis not present

## 2016-12-05 ENCOUNTER — Encounter: Payer: PPO | Admitting: *Deleted

## 2016-12-05 DIAGNOSIS — Z9289 Personal history of other medical treatment: Secondary | ICD-10-CM | POA: Diagnosis not present

## 2016-12-05 DIAGNOSIS — Z1231 Encounter for screening mammogram for malignant neoplasm of breast: Secondary | ICD-10-CM | POA: Diagnosis not present

## 2016-12-25 ENCOUNTER — Telehealth: Payer: Self-pay | Admitting: Family Medicine

## 2016-12-25 MED ORDER — OSELTAMIVIR PHOSPHATE 75 MG PO CAPS: 75.0000 mg | ORAL_CAPSULE | Freq: Two times a day (BID) | ORAL | 0 refills | Status: DC

## 2016-12-25 NOTE — Telephone Encounter (Signed)
Pt aware.

## 2016-12-25 NOTE — Telephone Encounter (Signed)
What symptoms do you have? Flu like symptoms achy and head hurts  How long have you been sick? This morning about 8:00am  Have you been seen for this problem? no  If your provider decides to give you a prescription, which pharmacy would you like for it to be sent to? Use [facility] in [place]   Patient informed that this information will be sent to the clinical staff for review and that they should receive a follow up call.

## 2016-12-25 NOTE — Telephone Encounter (Signed)
Okay to prescribe Tamiflu 75 mg twice a day #10

## 2017-01-11 ENCOUNTER — Encounter: Payer: Self-pay | Admitting: Family Medicine

## 2017-01-11 ENCOUNTER — Ambulatory Visit (INDEPENDENT_AMBULATORY_CARE_PROVIDER_SITE_OTHER): Payer: PPO | Admitting: Family Medicine

## 2017-01-11 VITALS — BP 92/55 | HR 81 | Temp 96.6°F | Ht 66.0 in | Wt 140.0 lb

## 2017-01-11 DIAGNOSIS — C73 Malignant neoplasm of thyroid gland: Secondary | ICD-10-CM | POA: Diagnosis not present

## 2017-01-11 DIAGNOSIS — Z86018 Personal history of other benign neoplasm: Secondary | ICD-10-CM | POA: Diagnosis not present

## 2017-01-11 DIAGNOSIS — I10 Essential (primary) hypertension: Secondary | ICD-10-CM

## 2017-01-11 DIAGNOSIS — E7849 Other hyperlipidemia: Secondary | ICD-10-CM

## 2017-01-11 DIAGNOSIS — I480 Paroxysmal atrial fibrillation: Secondary | ICD-10-CM

## 2017-01-11 DIAGNOSIS — Z23 Encounter for immunization: Secondary | ICD-10-CM

## 2017-01-11 DIAGNOSIS — E784 Other hyperlipidemia: Secondary | ICD-10-CM | POA: Diagnosis not present

## 2017-01-11 DIAGNOSIS — E559 Vitamin D deficiency, unspecified: Secondary | ICD-10-CM

## 2017-01-11 NOTE — Progress Notes (Signed)
Subjective:    Patient ID: [name], female    DOB: [birth year], 71 y.o.   MRN: [id number]  HPI Pt here for follow up and management of chronic medical problems which includes hyperlipidemia and hypertensin. She is taking medication regularly.This patient is doing well overall. She has a history of atrial fibrillation pheochromocytoma and thyroid cancer. She will get lab work today and we'll schedule a pelvic exam to me done by one of our providers. The patient is doing well overall as mentioned. She's never had a colonoscopy and prefers not to get one. There is no family history of colon cancer. She has an FOBT at home but she has not returned and she promises to return this. She denies any chest pain pressure or palpitations. She denies any shortness of breath. She denies any trouble with her GI tract including nausea vomiting heartburn indigestion blood in the stool or black tarry bowel movements. She is passing her water without problems and is scheduled to have a pelvic exam by one of the mid levels in the practice soon.    Patient Active Problem List   Diagnosis Date Noted  . History of pheochromocytoma 02/17/2014  . Cold intolerance 02/17/2014  . Vitamin D deficiency 10/21/2013  . 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 Hypothyroidism 10/21/2013  . Thyroid cancer ([place]) 06/18/2013  . High risk medication use 06/18/2013  . Sinus tachycardia 07/06/2012  . Hypokalemia 07/06/2012  . Hypertension 04/10/2010  . Atrial fibrillation ([place]) 04/04/2010  . Mitral valve disorder 03/28/2010  . 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 Artificial Tear Ointment (REFRESH LACRI-LUBE) OINT Apply 1 inch to eye at bedtime. Apply to bottom lid  . aspirin 325 MG EC tablet Take 325 mg by mouth daily.   . Cholecalciferol (VITAMIN D3) 2000 UNITS TABS Take 1 capsule by mouth daily.   . flecainide (TAMBOCOR) 50 MG tablet Take  1 tablet (50 mg total) by mouth 2 (two) times daily.  Marland Kitchen loratadine (CLARITIN) 10 MG tablet Take 10 mg by mouth daily as needed for allergies.   . Multiple Vitamins-Minerals (CENTRUM SILVER) tablet Take 1 tablet by mouth every morning.   . Multiple Vitamins-Minerals (PRESERVISION/LUTEIN PO) Take 1 capsule by mouth daily.  . Omega-3 Fatty Acids (FISH OIL) 1200 MG CAPS Take 1 capsule by mouth daily.  Marland Kitchen SYNTHROID 150 MCG tablet TAKE 1 TABLET (150 MCG TOTAL) BY MOUTH AS DIRECTED.  . [DISCONTINUED] oseltamivir (TAMIFLU) 75 MG capsule Take 1 capsule (75 mg total) by mouth 2 (two) times daily.   No facility-administered encounter medications on file as of 01/11/2017.       Review of Systems  Constitutional: Negative.   HENT: Negative.   Eyes: Negative.   Respiratory: Negative.   Cardiovascular: Negative.   Gastrointestinal: Negative.   Endocrine: Negative.   Genitourinary: Negative.   Musculoskeletal: Negative.   Skin: Negative.   Allergic/Immunologic: Negative.   Neurological: Negative.   Hematological: Negative.   Psychiatric/Behavioral: Negative.        Objective:   Physical Exam  Constitutional: She is oriented to person, place, and time. She appears well-developed and well-nourished. No distress.  Patient is pleasant and alert and continues to be gainfully employed.  HENT:  Head: Normocephalic and atraumatic.  Right Ear: External ear normal.  Left Ear: External ear normal.  Nose: Nose normal.  Mouth/Throat: Oropharynx is clear and moist. No oropharyngeal exudate.  Eyes: Conjunctivae and EOM  are normal. Pupils are equal, round, and reactive to light. Right eye exhibits no discharge. Left eye exhibits no discharge. No scleral icterus.  Sees Dr. Carolynn Sayers and Dr. Zadie Rhine on a regular basis  Neck: Normal range of motion. Neck supple. No thyromegaly present.  No thyromegaly. No thyroid masses.  Cardiovascular: Normal rate, regular rhythm, normal heart sounds and intact distal pulses.     No murmur heard. The heart is regular at 72/m  Pulmonary/Chest: Effort normal and breath sounds normal. No respiratory distress. She has no wheezes. She has no rales.  Clear anteriorly and posteriorly  Abdominal: Soft. Bowel sounds are normal. She exhibits no mass. There is no tenderness. There is no rebound and no guarding.  No masses tenderness or organomegaly or bruits  Musculoskeletal: Normal range of motion. She exhibits no edema.  Lymphadenopathy:    She has no cervical adenopathy.  Neurological: She is alert and oriented to person, place, and time. She has normal reflexes. No cranial nerve deficit.  Skin: Skin is warm and dry. No rash noted.  Psychiatric: She has a normal mood and affect. Her behavior is normal. Judgment and thought content normal.  Nursing note and vitals reviewed.  BP (!) 92/55 (BP Location: Left Arm)   Pulse 81   Temp (!) 96.6 F (35.9 C) (Oral)   Ht 5' 6"  (1.676 m)   Wt 140 lb (63.5 kg)   BMI 22.60 kg/m         Assessment & Plan:  1. Hypertension -The blood pressure is under good control and she will continue with her current flecainide medication from her cardiologist. - CBC with Differential/Platelet - BMP8+EGFR - Hepatic function panel  2. Vitamin D deficiency -Continue current treatment pending results of lab work - CBC with Differential/Platelet - VITAMIN D 25 Hydroxy (Vit-D Deficiency, Fractures)  3. Other hyperlipidemia -Continue aggressive therapeutic lifestyle changes - CBC with Differential/Platelet - NMR, lipoprofile  4. Thyroid cancer ([place]) -Continue current dose of thyroid medicine with when necessary visits to endocrinologist, Dr. Nicoletta Dress. - CBC with Differential/Platelet - Thyroid Panel With TSH  5. History of pheochromocytoma -No recurring problems with this and she is almost 5 years out from this removal.  6. Paroxysmal atrial fibrillation ([place]) -She will continue to be followed by the cardiologist and will continue the  flecainide per his direction.  Patient Instructions                       Medicare Annual Wellness Visit  [facility] and the medical providers at [facility] strive to bring you the best medical care.  In doing so we not only want to address your current medical conditions and concerns but also to detect new conditions early and prevent illness, disease and health-related problems.    Medicare offers a yearly Wellness Visit which allows our clinical staff to assess your need for preventative services including immunizations, lifestyle education, counseling to decrease risk of preventable diseases and screening for fall risk and other medical concerns.    This visit is provided free of charge (no copay) for all Medicare recipients. The clinical pharmacists at [facility] have begun to conduct these Wellness Visits which will also include a thorough review of all your medications.    As you primary medical provider recommend that you make an appointment for your Annual Wellness Visit if you have not done so already this year.  You may set up this appointment before you  leave today or you may call back ([phone]) and schedule an appointment.  Please make sure when you call that you mention that you are scheduling your Annual Wellness Visit with the clinical pharmacist so that the appointment may be made for the proper length of time.     Continue current medications. Continue good therapeutic lifestyle changes which include good diet and exercise. Fall precautions discussed with patient. If an FOBT was given today- please return it to our front desk. If you are over 41 years old - you may need Prevnar 50 or the adult Pneumonia vaccine.  **Flu shots are available--- please call and schedule a FLU-CLINIC appointment**  After your visit with [place] today you will receive a survey in the mail or online from [employer] regarding your care with [place]. Please take  a moment to fill this out. Your feedback is very important to [place] as you can help [place] better understand your patient needs as well as improve your experience and satisfaction. WE CARE ABOUT YOU!!!   Please return the FOBT card Into need to follow-up with cardiology Do not forget to get your pelvic exam and Pap smear Walk and exercise regularly and always be careful to not put yourself at risk for falling   Arrie Senate MD

## 2017-01-11 NOTE — Patient Instructions (Addendum)
Medicare Annual Wellness Visit  [facility] and the medical providers at [facility] strive to bring you the best medical care.  In doing so we not only want to address your current medical conditions and concerns but also to detect new conditions early and prevent illness, disease and health-related problems.    Medicare offers a yearly Wellness Visit which allows our clinical staff to assess your need for preventative services including immunizations, lifestyle education, counseling to decrease risk of preventable diseases and screening for fall risk and other medical concerns.    This visit is provided free of charge (no copay) for all Medicare recipients. The clinical pharmacists at [facility] have begun to conduct these Wellness Visits which will also include a thorough review of all your medications.    As you primary medical provider recommend that you make an appointment for your Annual Wellness Visit if you have not done so already this year.  You may set up this appointment before you leave today or you may call back [phone]) and schedule an appointment.  Please make sure when you call that you mention that you are scheduling your Annual Wellness Visit with the clinical pharmacist so that the appointment may be made for the proper length of time.     Continue current medications. Continue good therapeutic lifestyle changes which include good diet and exercise. Fall precautions discussed with patient. If an FOBT was given today- please return it to our front desk. If you are over 31 years old - you may need Prevnar 33 or the adult Pneumonia vaccine.  **Flu shots are available--- please call and schedule a FLU-CLINIC appointment**  After your visit with [place] today you will receive a survey in the mail or online from [employer] regarding your care with [place]. Please take a moment to fill this out. Your feedback is very  important to [place] as you can help [place] better understand your patient needs as well as improve your experience and satisfaction. WE CARE ABOUT YOU!!!   Please return the FOBT card Into need to follow-up with cardiology Do not forget to get your pelvic exam and Pap smear Walk and exercise regularly and always be careful to not put yourself at risk for falling

## 2017-01-12 LAB — CBC WITH DIFFERENTIAL/PLATELET
Basophils Absolute: 0 10*3/uL (ref 0.0–0.2)
Basos: 0 %
EOS (ABSOLUTE): 0.3 10*3/uL (ref 0.0–0.4)
EOS: 5 %
HEMATOCRIT: 38.3 % (ref 34.0–46.6)
Hemoglobin: 12.9 g/dL (ref 11.1–15.9)
Immature Grans (Abs): 0 10*3/uL (ref 0.0–0.1)
Immature Granulocytes: 0 %
LYMPHS ABS: 2.2 10*3/uL (ref 0.7–3.1)
Lymphs: 38 %
MCH: 29.5 pg (ref 26.6–33.0)
MCHC: 33.7 g/dL (ref 31.5–35.7)
MCV: 87 fL (ref 79–97)
MONOS ABS: 0.4 10*3/uL (ref 0.1–0.9)
Monocytes: 8 %
Neutrophils Absolute: 2.8 10*3/uL (ref 1.4–7.0)
Neutrophils: 49 %
Platelets: 209 10*3/uL (ref 150–379)
RBC: 4.38 x10E6/uL (ref 3.77–5.28)
RDW: 13.4 % (ref 12.3–15.4)
WBC: 5.7 10*3/uL (ref 3.4–10.8)

## 2017-01-12 LAB — THYROID PANEL WITH TSH
Free Thyroxine Index: 3.3 (ref 1.2–4.9)
T3 Uptake Ratio: 33 % (ref 24–39)
T4, Total: 10 ug/dL (ref 4.5–12.0)
TSH: 0.007 u[IU]/mL — AB (ref 0.450–4.500)

## 2017-01-12 LAB — NMR, LIPOPROFILE
CHOLESTEROL: 154 mg/dL (ref 100–199)
HDL CHOLESTEROL BY NMR: 84 mg/dL (ref >39)
HDL PARTICLE NUMBER: 33.4 umol/L (ref >=30.5)
LDL Particle Number: 439 nmol/L (ref <1000)
LDL SIZE: 21 nm (ref >20.5)
LDL-C: 63 mg/dL (ref 0–99)
LP-IR Score: <25 (ref <=45)
TRIGLYCERIDES BY NMR: 37 mg/dL (ref 0–149)

## 2017-01-12 LAB — HEPATIC FUNCTION PANEL
ALK PHOS: 80 IU/L (ref 39–117)
ALT: 18 IU/L (ref 0–32)
AST: 23 IU/L (ref 0–40)
Albumin: 3.9 g/dL (ref 3.5–4.8)
BILIRUBIN TOTAL: 0.7 mg/dL (ref 0.0–1.2)
Bilirubin, Direct: 0.21 mg/dL (ref 0.00–0.40)
Total Protein: 6.7 g/dL (ref 6.0–8.5)

## 2017-01-12 LAB — BMP8+EGFR
BUN/Creatinine Ratio: 23 (ref 12–28)
BUN: 13 mg/dL (ref 8–27)
CALCIUM: 9.7 mg/dL (ref 8.7–10.3)
CO2: 28 mmol/L (ref 18–29)
CREATININE: 0.56 mg/dL — AB (ref 0.57–1.00)
Chloride: 102 mmol/L (ref 96–106)
GFR calc Af Amer: 109 mL/min/{1.73_m2} (ref >59)
GFR, EST NON AFRICAN AMERICAN: 95 mL/min/{1.73_m2} (ref >59)
Glucose: 82 mg/dL (ref 65–99)
POTASSIUM: 4.5 mmol/L (ref 3.5–5.2)
Sodium: 142 mmol/L (ref 134–144)

## 2017-01-12 LAB — VITAMIN D 25 HYDROXY (VIT D DEFICIENCY, FRACTURES): VIT D 25 HYDROXY: 52.5 ng/mL (ref 30.0–100.0)

## 2017-01-30 ENCOUNTER — Ambulatory Visit (INDEPENDENT_AMBULATORY_CARE_PROVIDER_SITE_OTHER): Payer: PPO | Admitting: Nurse Practitioner

## 2017-01-30 ENCOUNTER — Encounter: Payer: Self-pay | Admitting: Nurse Practitioner

## 2017-01-30 VITALS — BP 99/56 | HR 96 | Temp 96.8°F | Ht 66.0 in | Wt 142.0 lb

## 2017-01-30 DIAGNOSIS — Z01419 Encounter for gynecological examination (general) (routine) without abnormal findings: Secondary | ICD-10-CM

## 2017-01-30 NOTE — Progress Notes (Addendum)
   Subjective:    Patient ID: [name], female    DOB: 07/08/1946, 71 y.o.   MRN: [id number]  HPI  [name] is a regular patient of Dr. Laurance Flatten that comes in today for pap and pelvic. She last followed up with Dr. Laurance Flatten on 01/11/17. She is doing well without complaints.   Review of Systems  Constitutional: Negative for diaphoresis.  Eyes: Negative for pain.  Respiratory: Negative for shortness of breath.   Cardiovascular: Negative for chest pain, palpitations and leg swelling.  Gastrointestinal: Negative for abdominal pain.  Endocrine: Negative for polydipsia.  Skin: Negative for rash.  Neurological: Negative for dizziness, weakness and headaches.  Hematological: Does not bruise/bleed easily.       Objective:   Physical Exam  Constitutional: She is oriented to person, place, and time. She appears well-developed and well-nourished.  HENT:  Head: Normocephalic.  Right Ear: Hearing, tympanic membrane, external ear and ear canal normal.  Left Ear: Hearing, tympanic membrane, external ear and ear canal normal.  Nose: Nose normal.  Mouth/Throat: Uvula is midline and oropharynx is clear and moist.  Eyes: Conjunctivae and EOM are normal. Pupils are equal, round, and reactive to light.  Neck: Normal range of motion and full passive range of motion without pain. Neck supple. No JVD present. Carotid bruit is not present. No thyroid mass and no thyromegaly present.  Cardiovascular: Normal rate, normal heart sounds and intact distal pulses.   No murmur heard. Pulmonary/Chest: Effort normal and breath sounds normal. Right breast exhibits no inverted nipple, no mass, no nipple discharge, no skin change and no tenderness. Left breast exhibits no inverted nipple, no mass, no nipple discharge, no skin change and no tenderness.  Abdominal: Soft. Bowel sounds are normal. She exhibits no mass. There is no tenderness.  Genitourinary: Vagina normal and uterus normal. No breast swelling, tenderness,  discharge or bleeding. No vaginal discharge found.  Genitourinary Comments: bimanual exam-No adnexal masses or tenderness. Cervix parous and pink  Musculoskeletal: Normal range of motion.  Lymphadenopathy:    She has no cervical adenopathy.  Neurological: She is alert and oriented to person, place, and time.  Skin: Skin is warm and dry.  Foreign body on umbilicus- extracted some out  Psychiatric: She has a normal mood and affect. Her behavior is normal. Judgment and thought content normal.   BP (!) 99/56   Pulse 96   Temp (!) 96.8 F (36 C) (Oral)   Ht 5\' 6"  (1.676 m)   Wt 142 lb (64.4 kg)   BMI 22.92 kg/m         Assessment & Plan:  1. Gynecologic exam normal Keep follow up appointments with Dr. Rebbeca Paul prn - Pap IG (Image Guided)  Mary-Margaret Hassell Done, FNP

## 2017-02-01 LAB — PAP IG (IMAGE GUIDED): PAP SMEAR COMMENT: 0

## 2017-02-08 ENCOUNTER — Other Ambulatory Visit: Payer: Self-pay | Admitting: Cardiology

## 2017-02-08 NOTE — Telephone Encounter (Signed)
REFILL 

## 2017-02-15 ENCOUNTER — Encounter: Payer: Self-pay | Admitting: Cardiology

## 2017-03-04 NOTE — Progress Notes (Signed)
HPI The patient presents for follow up of atrial fib.   She has had surgery for treatment of  pheochromocytoma.  She has moderate tricuspid regurgitation with no evidence of pulmonary hypertension.  At the last appt I ordered an echo which demonstrated continued moderate TR.  The EF was low normal and there was some mild MR.   Since I last saw her she has done well.  The patient denies any new symptoms such as chest discomfort, neck or arm discomfort. There has been no new shortness of breath, PND or orthopnea. There have been no reported palpitations, presyncope or syncope.  She is active in her yard and does ride a bike at times.      Allergies  Allergen Reactions  . Penicillins Anaphylaxis  . Actonel [Risedronate Sodium]     Nausea and worsened heart burn  . Ceclor [Cefaclor] Hives  . Acetaminophen Nausea And Vomiting and Other (See Comments)    Headache  . Alendronate Sodium Nausea Only and Other (See Comments)    Heartburn, stomach upset  . Celecoxib Other (See Comments)    Stomach upset, heart burn  . Codeine Nausea And Vomiting and Other (See Comments)    Headache  . Ventolin [Kdc:Albuterol] Palpitations    Rapid heart beat    Current Outpatient Prescriptions  Medication Sig Dispense Refill  . Artificial Tear Ointment (REFRESH LACRI-LUBE) OINT Apply 1 inch to eye at bedtime. Apply to bottom lid    . aspirin 325 MG EC tablet Take 325 mg by mouth daily.     . Cholecalciferol (VITAMIN D3) 2000 UNITS TABS Take 1 capsule by mouth daily.     . flecainide (TAMBOCOR) 50 MG tablet TAKE 1 TABLET (50 MG TOTAL) BY MOUTH 2 (TWO) TIMES DAILY. 60 tablet 10  . Multiple Vitamins-Minerals (CENTRUM SILVER) tablet Take 1 tablet by mouth every morning.     . Multiple Vitamins-Minerals (PRESERVISION/LUTEIN PO) Take 1 capsule by mouth daily.    . Omega-3 Fatty Acids (FISH OIL) 1200 MG CAPS Take 1 capsule by mouth daily.    Marland Kitchen SYNTHROID 150 MCG tablet TAKE 1 TABLET (150 MCG TOTAL) BY MOUTH AS  DIRECTED. 30 tablet 6  . loratadine (CLARITIN) 10 MG tablet Take 10 mg by mouth daily as needed for allergies.      No current facility-administered medications for this visit.     Past Medical History:  Diagnosis Date  . Atrial fibrillation (HCC)    PT ON FLECANIDE -DID NOT WANT TO BE ON BLOOD THINNERS OTHER THAN DAILY ASA  . Blurry vision, right eye    HX OF RT EYE VISION DISTURBANCE 01/2012 -PT SEEN BY DR. RANKIN--ELEVATED B/P  THOUGHT TO BE RELATED TO [place] SPOTTED FEVER THOUGHT TO BE CAUSE OF VISION PROBLEM-HAD LASER OF BOTH EYES (SLIGHT DAMAGE TO LEFT  EYE).    . Cataract    BILATERAL  . GERD (gastroesophageal reflux disease)   . Headache(784.0)    PAST HX MIGRAINES  . Hearing loss of right ear   . Hypertension    B/P HAS BEEN UP AND DOWN BUT MOSTLY DOWN WHILE ON METOPROLOL -WAS TAKEN OFF METOROLOL IN NOV 2012 BY CARDIOLOGIST AND THEN IN MARCH 2013 PT'S B/P BECAME REALLY ELEVATED-SHE RESTARTED METOPROLOL  AND WAS GIVEN BENICAR.  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 Hypothyroidism   . Mitral valve prolapse    per pt report, not listed on echo  . MR (mitral  regurgitation)    none on echo in 2013  . Osteoarthritis    LITTLE FINGER LEFT HAND  . Palpitations JUNE 2012   piror to diagnosis of SVT  -NO PROBLMS WITH REOCCURANCE  . Pheochromocytoma   . PONV (postoperative nausea and vomiting)    HX OF N&V AFTER SURGERIES-EXCEPT NO PROBLEM AFTER WRIST SURGERY IN 2012-AT [facility]  . Thyroid cancer [facility])    age 71; s/p resection with subsequent hypothyroidism. 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 TMJ locking    PT STATES IF MOUTH OPEN EXTENDED TIME-LIKE AT DENTIST--HER JAWS LOCK  . TR (tricuspid regurgitation)   . Vegetarian diet    DOES EAT EGGS AND DAIRY--NO MEATS OR FISH    Past Surgical History:  Procedure Laterality Date  . ADRENALECTOMY  07/03/2012   Procedure: ADRENALECTOMY;  Surgeon: Dutch Gray, MD;   Location: [facility];  Service: Urology;  Laterality: Right;  . APPENDECTOMY    . Lung biopsies    . THYROIDECTOMY    . WRIST SURGERY      ROS:   As stated in the HPI and negative for all other systems.  PHYSICAL EXAM BP (!) 122/58   Pulse 64   Ht 5\' 6"  (1.676 m)   Wt 141 lb (64 kg)   BMI 22.76 kg/m  GENERAL:  Well appearing NECK:  No jugular venous distention, waveform within normal limits, carotid upstroke brisk and symmetric, no bruits, no thyromegaly, healed thyroidectomy scar LUNGS:  Clear to auscultation bilaterally CHEST:  Unremarkable HEART:  PMI not displaced or sustained,S1 and S2 within normal limits, no S3, no S4, no clicks, no rubs, no murmurs ABD:  Flat, positive bowel sounds normal in frequency in pitch, no bruits, no rebound, no guarding, no midline pulsatile mass, no hepatomegaly, no splenomegaly, healed abdominal scar EXT:  2 plus pulses throughout, no edema, no cyanosis no clubbing, varicose veins   EKG: Sinus rhythm, rate 64, left ventricular hypertrophy by voltage criteria, repolarization changes, no acute ST-T wave changes.  PACs. 03/06/2017   ASSESSMENT AND PLAN   HYPERTENSION -   Her blood pressure is normal. No change in therapy is indicated.  She will continue meds as listed.   ATRIAL FIBRILLATION -  She has had no symptomatic or documented paroxysms of her atrial fibrillation  since being on flecainide.  She has had appropriate follow up with a POET (Plain Old Exercise Treadmill) and drug level.  Of note she is not on any AV nodal blocking agent but she has been intolerant of these medicines and does not want to take the calcium blocker or beta blocker.  She has some PACs but does OK with these.   TRICUSPID REGURGITATION - In April 2017 TR was moderate.  I would not know that she has this by exam.  No change in therapy is plannned.  No further imaging.   MITRAL REGURGITATION -  MR was mild on echo last year.  I will follow this clinically and with repeat  echocardiograms.

## 2017-03-06 ENCOUNTER — Encounter: Payer: Self-pay | Admitting: Cardiology

## 2017-03-06 ENCOUNTER — Ambulatory Visit (INDEPENDENT_AMBULATORY_CARE_PROVIDER_SITE_OTHER): Payer: PPO | Admitting: Cardiology

## 2017-03-06 VITALS — BP 122/58 | HR 64 | Ht 66.0 in | Wt 141.0 lb

## 2017-03-06 DIAGNOSIS — I361 Nonrheumatic tricuspid (valve) insufficiency: Secondary | ICD-10-CM

## 2017-03-06 DIAGNOSIS — I480 Paroxysmal atrial fibrillation: Secondary | ICD-10-CM | POA: Diagnosis not present

## 2017-03-06 DIAGNOSIS — I10 Essential (primary) hypertension: Secondary | ICD-10-CM

## 2017-03-06 NOTE — Patient Instructions (Signed)

## 2017-05-07 ENCOUNTER — Other Ambulatory Visit: Payer: Self-pay | Admitting: Family Medicine

## 2017-05-20 ENCOUNTER — Ambulatory Visit: Payer: PPO | Admitting: Family Medicine

## 2017-06-14 ENCOUNTER — Ambulatory Visit (INDEPENDENT_AMBULATORY_CARE_PROVIDER_SITE_OTHER): Payer: PPO | Admitting: Family Medicine

## 2017-06-14 ENCOUNTER — Encounter: Payer: Self-pay | Admitting: Family Medicine

## 2017-06-14 ENCOUNTER — Ambulatory Visit (INDEPENDENT_AMBULATORY_CARE_PROVIDER_SITE_OTHER): Payer: PPO

## 2017-06-14 VITALS — BP 95/55 | HR 66 | Temp 97.1°F | Ht 66.0 in | Wt 140.0 lb

## 2017-06-14 DIAGNOSIS — E279 Disorder of adrenal gland, unspecified: Secondary | ICD-10-CM | POA: Diagnosis not present

## 2017-06-14 DIAGNOSIS — C73 Malignant neoplasm of thyroid gland: Secondary | ICD-10-CM | POA: Diagnosis not present

## 2017-06-14 DIAGNOSIS — Z7689 Persons encountering health services in other specified circumstances: Secondary | ICD-10-CM | POA: Diagnosis not present

## 2017-06-14 DIAGNOSIS — I10 Essential (primary) hypertension: Secondary | ICD-10-CM | POA: Diagnosis not present

## 2017-06-14 DIAGNOSIS — E7849 Other hyperlipidemia: Secondary | ICD-10-CM

## 2017-06-14 DIAGNOSIS — Z86018 Personal history of other benign neoplasm: Secondary | ICD-10-CM | POA: Diagnosis not present

## 2017-06-14 DIAGNOSIS — E559 Vitamin D deficiency, unspecified: Secondary | ICD-10-CM | POA: Diagnosis not present

## 2017-06-14 DIAGNOSIS — E784 Other hyperlipidemia: Secondary | ICD-10-CM | POA: Diagnosis not present

## 2017-06-14 NOTE — Progress Notes (Signed)
Subjective:    Patient ID: [name], female    DOB: 1946/04/27, 71 y.o.   MRN: [id number]  HPI Pt here for follow up and management of chronic medical problems which includes hypertension and hyperlipidemia. She is taking medication regularly.This patient is well known to me for many years. She has a history of thyroid cancer with thyroid replacement. She has a history of a pheochromocytoma with an adrenalectomy. She is followed regularly for cardiac arrhythmias and monitor closely for her bone density. The patient is pleasant and doing well. Her husband has now retired but she is still working for [employer] she has worked for for over 46 years. She is not working full-time. She denies any chest pain or palpitations or shortness of breath. She has no trouble with her intestinal tract including nausea vomiting diarrhea blood in the stool or black tarry bowel movements. She is passing her water without problems.    Patient Active Problem List   Diagnosis Date Noted  . 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 History of pheochromocytoma 02/17/2014  . Cold intolerance 02/17/2014  . Vitamin D deficiency 10/21/2013  . 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 Hypothyroidism 10/21/2013  . Thyroid cancer ([place]) 06/18/2013  . High risk medication use 06/18/2013  . Sinus tachycardia 07/06/2012  . Hypokalemia 07/06/2012  . Hypertension 04/10/2010  . Atrial fibrillation ([place]) 04/04/2010  . Mitral valve disorder 03/28/2010  . 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 Artificial Tear Ointment (REFRESH LACRI-LUBE) OINT Apply 1 inch to eye at bedtime. Apply to bottom lid  . aspirin 325 MG EC tablet Take 325 mg by mouth daily.   . Cholecalciferol (VITAMIN D3) 2000 UNITS TABS Take 1 capsule by mouth daily.   . flecainide (TAMBOCOR) 50 MG tablet TAKE 1 TABLET (50 MG TOTAL) BY MOUTH 2 (TWO) TIMES DAILY.    Marland Kitchen loratadine (CLARITIN) 10 MG tablet Take 10 mg by mouth daily as needed for allergies.   . Multiple Vitamins-Minerals (CENTRUM SILVER) tablet Take 1 tablet by mouth every morning.   . Multiple Vitamins-Minerals (PRESERVISION/LUTEIN PO) Take 1 capsule by mouth daily.  . Omega-3 Fatty Acids (FISH OIL) 1200 MG CAPS Take 1 capsule by mouth daily.  Marland Kitchen SYNTHROID 150 MCG tablet TAKE 1 TABLET (150 MCG TOTAL) BY MOUTH AS DIRECTED.   No facility-administered encounter medications on file as of 06/14/2017.       Review of Systems  Constitutional: Negative.   HENT: Negative.   Eyes: Negative.   Respiratory: Negative.   Cardiovascular: Negative.   Gastrointestinal: Negative.   Endocrine: Negative.   Genitourinary: Negative.   Musculoskeletal: Negative.   Skin: Negative.   Allergic/Immunologic: Negative.   Neurological: Negative.   Hematological: Negative.   Psychiatric/Behavioral: Negative.        Objective:   Physical Exam  Constitutional: She is oriented to person, place, and time. She appears well-nourished. No distress.  The patient is pleasant and alert and small framed.  HENT:  Head: Normocephalic and atraumatic.  Right Ear: External ear normal.  Left Ear: External ear normal.  Nose: Nose normal.  Mouth/Throat: Oropharynx is clear and moist. No oropharyngeal exudate.  Slight nasal congestion  Eyes: Pupils are equal, round, and reactive to light. Conjunctivae and EOM are normal. Right eye exhibits no discharge. Left eye exhibits no discharge. No scleral icterus.  Neck: Normal range of motion. Neck supple. No thyromegaly present.  No bruits thyromegaly  or anterior cervical adenopathy  Cardiovascular: Normal rate, regular rhythm, normal heart sounds and intact distal pulses.   No murmur heard. Heart is regular at 72/m  Pulmonary/Chest: Effort normal and breath sounds normal. No respiratory distress. She has no wheezes. She has no rales.  Clear anteriorly and posteriorly   Abdominal: Soft. Bowel sounds are normal. She exhibits no mass. There is no tenderness. There is no rebound and no guarding.  No abdominal tenderness or organ enlargement bruits or masses  Musculoskeletal: Normal range of motion. She exhibits no edema.  Lymphadenopathy:    She has no cervical adenopathy.  Neurological: She is alert and oriented to person, place, and time. She has normal reflexes. No cranial nerve deficit.  Skin: Skin is warm and dry. Rash noted. There is erythema.  There is an excoriated rash on the lower ankle on the right side. The patient will continue to use a combination of antifungal and steroid cream on this to see if she can eliminate this rash once and for all. She is already watching and avoiding soaps that have scent as well as fabric softeners and detergents.  Psychiatric: She has a normal mood and affect. Her behavior is normal. Judgment and thought content normal.  Nursing note and vitals reviewed.  BP (!) 95/55 (BP Location: Left Arm)   Pulse 66   Temp (!) 97.1 F (36.2 C) (Oral)   Ht 5' 6"  (1.676 m)   Wt 140 lb (63.5 kg)   BMI 22.60 kg/m         Assessment & Plan:  1. Hypertension -The blood pressure in fact is on the low end of the range today. She will continue with her current treatments and follow-up with cardiology as planned on a yearly basis - BMP8+EGFR - Hepatic function panel - CBC with Differential/Platelet - DG Chest 2 View; Future  2. Other hyperlipidemia -Continue with omega-3 fatty acids in therapeutic lifestyle changes - Lipid panel - CBC with Differential/Platelet - DG Chest 2 View; Future  3. Vitamin D deficiency -Continue with vitamin D replacement and calcium - VITAMIN D 25 Hydroxy (Vit-D Deficiency, Fractures) - CBC with Differential/Platelet  4. Thyroid cancer ([place]) -Continue with current thyroid replacement pending results of lab work - CBC with Differential/Platelet  5. 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 She is doing well and having no more symptoms related to this. - Cortisol - CBC with Differential/Platelet  6. Personal history of pheochromocytoma -Since this is been removed and she has been feeling much better.  Patient Instructions                       Medicare Annual Wellness Visit  [facility] and the medical providers at [facility] strive to bring you the best medical care.  In doing so we not only want to address your current medical conditions and concerns but also to detect new conditions early and prevent illness, disease and health-related problems.    Medicare offers a yearly Wellness Visit which allows our clinical staff to assess your need for preventative services including immunizations, lifestyle education, counseling to decrease risk of preventable diseases and screening for fall risk and other medical concerns.    This visit is provided free of charge (no copay) for all Medicare recipients. The clinical pharmacists at [facility] have begun to conduct these Wellness Visits which will also include a thorough review of all your  medications.    As you primary medical provider recommend that you make an appointment for your Annual Wellness Visit if you have not done so already this year.  You may set up this appointment before you leave today or you may call back ([phone]) and schedule an appointment.  Please make sure when you call that you mention that you are scheduling your Annual Wellness Visit with the clinical pharmacist so that the appointment may be made for the proper length of time.      Continue current medications. Continue good therapeutic lifestyle changes which include good diet and exercise. Fall precautions discussed with patient. If an FOBT was given today- please return it to our front desk. If you are over 34 years old - you may need Prevnar 71 or the adult Pneumonia  vaccine.  **Flu shots are available--- please call and schedule a FLU-CLINIC appointment**  After your visit with [place] today you will receive a survey in the mail or online from [employer] regarding your care with [place]. Please take a moment to fill this out. Your feedback is very important to [place] as you can help [place] better understand your patient needs as well as improve your experience and satisfaction. WE CARE ABOUT YOU!!!   Continue with current meds We will call with lab work as soon as the results become available as well as with the chest x-ray report. Please return the FOBT you were given today. Continue to be careful do not put yourself at risk for falling    Arrie Senate MD

## 2017-06-14 NOTE — Patient Instructions (Addendum)
Medicare Annual Wellness Visit  [facility] and the medical providers at [facility] strive to bring you the best medical care.  In doing so we not only want to address your current medical conditions and concerns but also to detect new conditions early and prevent illness, disease and health-related problems.    Medicare offers a yearly Wellness Visit which allows our clinical staff to assess your need for preventative services including immunizations, lifestyle education, counseling to decrease risk of preventable diseases and screening for fall risk and other medical concerns.    This visit is provided free of charge (no copay) for all Medicare recipients. The clinical pharmacists at [facility] have begun to conduct these Wellness Visits which will also include a thorough review of all your medications.    As you primary medical provider recommend that you make an appointment for your Annual Wellness Visit if you have not done so already this year.  You may set up this appointment before you leave today or you may call back ([phone]) and schedule an appointment.  Please make sure when you call that you mention that you are scheduling your Annual Wellness Visit with the clinical pharmacist so that the appointment may be made for the proper length of time.      Continue current medications. Continue good therapeutic lifestyle changes which include good diet and exercise. Fall precautions discussed with patient. If an FOBT was given today- please return it to our front desk. If you are over 21 years old - you may need Prevnar 75 or the adult Pneumonia vaccine.  **Flu shots are available--- please call and schedule a FLU-CLINIC appointment**  After your visit with [place] today you will receive a survey in the mail or online from [employer] regarding your care with [place]. Please take a moment to fill this out. Your feedback is very  important to [place] as you can help [place] better understand your patient needs as well as improve your experience and satisfaction. WE CARE ABOUT YOU!!!   Continue with current meds We will call with lab work as soon as the results become available as well as with the chest x-ray report. Please return the FOBT you were given today. Continue to be careful do not put yourself at risk for falling

## 2017-06-15 LAB — BMP8+EGFR
BUN / CREAT RATIO: 25 (ref 12–28)
BUN: 13 mg/dL (ref 8–27)
CHLORIDE: 102 mmol/L (ref 96–106)
CO2: 27 mmol/L (ref 20–29)
Calcium: 9.6 mg/dL (ref 8.7–10.3)
Creatinine, Ser: 0.52 mg/dL — ABNORMAL LOW (ref 0.57–1.00)
GFR calc non Af Amer: 96 mL/min/{1.73_m2} (ref >59)
GFR, EST AFRICAN AMERICAN: 111 mL/min/{1.73_m2} (ref >59)
Glucose: 82 mg/dL (ref 65–99)
Potassium: 4.6 mmol/L (ref 3.5–5.2)
Sodium: 141 mmol/L (ref 134–144)

## 2017-06-15 LAB — CBC WITH DIFFERENTIAL/PLATELET
BASOS: 0 %
Basophils Absolute: 0 10*3/uL (ref 0.0–0.2)
EOS (ABSOLUTE): 0.4 10*3/uL (ref 0.0–0.4)
EOS: 7 %
HEMATOCRIT: 39.4 % (ref 34.0–46.6)
Hemoglobin: 13.3 g/dL (ref 11.1–15.9)
IMMATURE GRANS (ABS): 0 10*3/uL (ref 0.0–0.1)
IMMATURE GRANULOCYTES: 0 %
LYMPHS: 35 %
Lymphocytes Absolute: 2.1 10*3/uL (ref 0.7–3.1)
MCH: 30.2 pg (ref 26.6–33.0)
MCHC: 33.8 g/dL (ref 31.5–35.7)
MCV: 90 fL (ref 79–97)
Monocytes Absolute: 0.5 10*3/uL (ref 0.1–0.9)
Monocytes: 8 %
NEUTROS PCT: 50 %
Neutrophils Absolute: 2.9 10*3/uL (ref 1.4–7.0)
PLATELETS: 176 10*3/uL (ref 150–379)
RBC: 4.4 x10E6/uL (ref 3.77–5.28)
RDW: 14.3 % (ref 12.3–15.4)
WBC: 5.9 10*3/uL (ref 3.4–10.8)

## 2017-06-15 LAB — LIPID PANEL
CHOLESTEROL TOTAL: 162 mg/dL (ref 100–199)
Chol/HDL Ratio: 1.5 ratio (ref 0.0–4.4)
HDL: 108 mg/dL (ref >39)
LDL Calculated: 47 mg/dL (ref 0–99)
TRIGLYCERIDES: 33 mg/dL (ref 0–149)
VLDL Cholesterol Cal: 7 mg/dL (ref 5–40)

## 2017-06-15 LAB — HEPATIC FUNCTION PANEL
ALK PHOS: 88 IU/L (ref 39–117)
ALT: 16 IU/L (ref 0–32)
AST: 25 IU/L (ref 0–40)
Albumin: 4.5 g/dL (ref 3.5–4.8)
BILIRUBIN, DIRECT: 0.21 mg/dL (ref 0.00–0.40)
Bilirubin Total: 0.8 mg/dL (ref 0.0–1.2)
TOTAL PROTEIN: 7.3 g/dL (ref 6.0–8.5)

## 2017-06-15 LAB — VITAMIN D 25 HYDROXY (VIT D DEFICIENCY, FRACTURES): Vit D, 25-Hydroxy: 55.2 ng/mL (ref 30.0–100.0)

## 2017-06-15 LAB — CORTISOL: CORTISOL: 17.6 ug/dL

## 2017-06-18 LAB — THYROID PANEL WITH TSH
Free Thyroxine Index: 3.4 (ref 1.2–4.9)
T3 UPTAKE RATIO: 32 % (ref 24–39)
T4, Total: 10.6 ug/dL (ref 4.5–12.0)
TSH: 0.006 u[IU]/mL — ABNORMAL LOW (ref 0.450–4.500)

## 2017-06-18 LAB — SPECIMEN STATUS REPORT

## 2017-08-22 ENCOUNTER — Encounter: Payer: Self-pay | Admitting: *Deleted

## 2017-08-22 ENCOUNTER — Ambulatory Visit (INDEPENDENT_AMBULATORY_CARE_PROVIDER_SITE_OTHER): Payer: PPO | Admitting: *Deleted

## 2017-08-22 VITALS — BP 109/76 | HR 78 | Ht 65.5 in | Wt 139.0 lb

## 2017-08-22 DIAGNOSIS — Z0000 Encounter for general adult medical examination without abnormal findings: Secondary | ICD-10-CM | POA: Diagnosis not present

## 2017-08-22 DIAGNOSIS — Z23 Encounter for immunization: Secondary | ICD-10-CM

## 2017-08-22 NOTE — Patient Instructions (Signed)
  Ms. Godbee , Thank you for taking time to come for your Medicare Wellness Visit. I appreciate your ongoing commitment to your health goals. Please review the following plan we discussed and let me know if I can assist you in the future.   These are the goals we discussed: Goals    . Exercise 150 minutes per week (moderate activity)       This is a list of the screening recommended for you and due dates:  Health Maintenance  Topic Date Due  . Stool Blood Test  03/05/2017  . Flu Shot  01/12/2018*  .  Hepatitis C: One time screening is recommended by Center for Disease Control  (CDC) for  adults born from [birth year] through 1965.   08/14/2023*  . Mammogram  12/05/2017  . Pap Smear  01/31/2019  . Tetanus Vaccine  01/12/2027  . DEXA scan (bone density measurement)  Completed  . Pneumonia vaccines  Completed  *Topic was postponed. The date shown is not the original due date.

## 2017-08-26 DIAGNOSIS — Z0000 Encounter for general adult medical examination without abnormal findings: Secondary | ICD-10-CM | POA: Diagnosis not present

## 2017-08-26 DIAGNOSIS — Z23 Encounter for immunization: Secondary | ICD-10-CM | POA: Diagnosis not present

## 2017-08-26 NOTE — Progress Notes (Addendum)
Subjective:   [name] is a 71 y.o. female who presents for an Initial Medicare Annual Wellness Visit. [name] lives at home with her husband. They have 1 adult daughter and 2 grandchildren.   Review of Systems    Reports that her health is about the same as last year.   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   Other systems negative today    Objective:    Today's Vitals   08/22/17 1400  BP: 109/76  Pulse: 78  Weight: 139 lb (63 kg)  Height: 5' 5.5" (1.664 m)   Body mass index is 22.78 kg/m.   Current Medications (verified) Outpatient Encounter Prescriptions as of 08/22/2017  Medication Sig  . Artificial Tear Ointment (REFRESH LACRI-LUBE) OINT Apply 1 inch to eye at bedtime. Apply to bottom lid  . aspirin 325 MG EC tablet Take 325 mg by mouth daily.   . Cholecalciferol (VITAMIN D3) 2000 UNITS TABS Take 1 capsule by mouth daily.   . flecainide (TAMBOCOR) 50 MG tablet TAKE 1 TABLET (50 MG TOTAL) BY MOUTH 2 (TWO) TIMES DAILY.  Marland Kitchen loratadine (CLARITIN) 10 MG tablet Take 10 mg by mouth daily as needed for allergies.   . Multiple Vitamins-Minerals (CENTRUM SILVER) tablet Take 1 tablet by mouth every morning.   . Multiple Vitamins-Minerals (PRESERVISION/LUTEIN PO) Take 1 capsule by mouth daily.  . Omega-3 Fatty Acids (FISH OIL) 1200 MG CAPS Take 1 capsule by mouth daily.  Marland Kitchen SYNTHROID 150 MCG tablet TAKE 1 TABLET (150 MCG TOTAL) BY MOUTH AS DIRECTED.   No facility-administered encounter medications on file as of 08/22/2017.     Allergies (verified) Penicillins; Actonel [risedronate sodium]; Ceclor [cefaclor]; Acetaminophen; Alendronate sodium; Celecoxib; Codeine; and Ventolin [kdc:albuterol]   History: Past Medical History:  Diagnosis Date  . Atrial fibrillation (HCC)    PT ON FLECANIDE -DID NOT WANT TO BE ON BLOOD THINNERS OTHER  THAN DAILY ASA  . Blurry vision, right eye    HX OF RT EYE VISION DISTURBANCE 01/2012 -PT SEEN BY DR. RANKIN--ELEVATED B/P  THOUGHT TO BE RELATED TO R[place] SPOTTED FEVER THOUGHT TO BE CAUSE OF VISION PROBLEM-HAD LASER OF BOTH EYES (SLIGHT DAMAGE TO LEFT  EYE).    . Cataract    BILATERAL  . GERD (gastroesophageal reflux disease)   . Headache(784.0)    PAST HX MIGRAINES  . Hearing loss of right ear   . Hypertension    B/P HAS BEEN UP AND DOWN BUT MOSTLY DOWN WHILE ON METOPROLOL -WAS TAKEN OFF METOROLOL IN NOV 2012 BY CARDIOLOGIST AND THEN IN MARCH 2013 PT'S B/P BECAME REALLY ELEVATED-SHE RESTARTED METOPROLOL  AND WAS GIVEN BENICAR.  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 Hypothyroidism   . Mitral valve prolapse    per pt report, not listed on echo  . MR (mitral regurgitation)    none on echo in 2013  . Osteoarthritis    LITTLE FINGER LEFT HAND  . Palpitations JUNE 2012   piror to diagnosis of SVT  -NO PROBLMS WITH REOCCURANCE  . Pheochromocytoma   . PONV (postoperative nausea and vomiting)    HX OF N&V AFTER SURGERIES-EXCEPT NO PROBLEM AFTER WRIST SURGERY IN 2012-AT [facility]  . Thyroid cancer [facility])    age 56; s/p resection with subsequent hypothyroidism. hx of mets to luns, which was treated (  unsure of how)  . TMJ locking    PT STATES IF MOUTH OPEN EXTENDED TIME-LIKE AT DENTIST--HER JAWS LOCK  . TR (tricuspid regurgitation)   . Vegetarian diet    DOES EAT EGGS AND DAIRY--NO MEATS OR FISH   Past Surgical History:  Procedure Laterality Date  . ADRENALECTOMY  07/03/2012   Procedure: ADRENALECTOMY;  Surgeon: Dutch Gray, MD;  Location: [facility];  Service: Urology;  Laterality: Right;  . APPENDECTOMY    . Lung biopsies    . THYROIDECTOMY    . WRIST SURGERY     Family History  Problem Relation Age of Onset  . Kidney disease Mother   . Cancer Sister        lymphoma   Social History   Occupational History  . Not on file.   Social History  Main Topics  . Smoking status: Never Smoker  . Smokeless tobacco: Never Used     Comment: no tobacco   . Alcohol use No  . Drug use: No  . Sexual activity: Not on file    Tobacco Counseling No tobacco use  Activities of Daily Living In your present state of health, do you have any difficulty performing the following activities: 08/22/2017  Hearing? Y  Comment some hearing loss in her right ear  Vision? N  Difficulty concentrating or making decisions? N  Walking or climbing stairs? N  Dressing or bathing? N  Doing errands, shopping? N  Preparing Food and eating ? N  Using the Toilet? N  In the past six months, have you accidently leaked urine? N  Do you have problems with loss of bowel control? N  Managing your Medications? N  Managing your Finances? N  Housekeeping or managing your Housekeeping? N  Some recent data might be hidden    Immunizations and Health Maintenance Immunization History  Administered Date(s) Administered  . Influenza Whole 08/05/2010  . Influenza, High Dose Seasonal PF 08/13/2016  . Influenza,inj,Quad PF,6+ Mos 08/19/2013, 08/19/2014, 08/11/2015  . Pneumococcal Conjugate-13 10/21/2013  . Pneumococcal Polysaccharide-23 11/05/1994, 08/27/2011  . Td 11/05/2006  . Tdap 01/11/2017   Health Maintenance Due  Topic Date Due  . COLON CANCER SCREENING ANNUAL FOBT  03/05/2017    Patient Care Team: Chipper Herb, MD as PCP - General (Family Medicine) Minus Breeding, MD (Cardiology) Zadie Rhine Clent Demark, MD (Ophthalmology) Clent Jacks, MD (Ophthalmology)  No hospitalizations, ER visits, or surgeries this past year.      Assessment:   This is a routine wellness examination for [name].   Hearing/Vision screen No deficits noted during visit. Eye exam scheduled for next month.   Dietary issues and exercise activities discussed: Exercise limited by: None identified  Goals    . Exercise 150 minutes per week (moderate activity)      Depression  Screen PHQ 2/9 Scores 08/22/2017 06/14/2017 01/30/2017 01/11/2017 08/13/2016 03/26/2016 11/17/2015  PHQ - 2 Score 0 0 0 0 0 0 0    Fall Risk Fall Risk  08/22/2017 06/14/2017 01/30/2017 01/11/2017 08/13/2016  Falls in the past year? No Yes No No No  Number falls in past yr: - 1 - - -  Injury with Fall? - Yes - - -    Cognitive Function: MMSE - Mini Mental State Exam 08/22/2017  Orientation to time 5  Orientation to Place 5  Registration 3  Attention/ Calculation 5  Recall 3  Language- name 2 objects 2  Language- repeat 1  Language- follow 3 step command 3  Language-  read & follow direction 1  Write a sentence 1  Copy design 0  Total score 29        Screening Tests Health Maintenance  Topic Date Due  . COLON CANCER SCREENING ANNUAL FOBT  03/05/2017  . INFLUENZA VACCINE  01/12/2018 (Originally 06/05/2017)  . Hepatitis C Screening  08/14/2023 (Originally 02-Aug-1946)  . MAMMOGRAM  12/05/2017  . PAP SMEAR  01/31/2019  . TETANUS/TDAP  01/12/2027  . DEXA SCAN  Completed  . PNA vac Low Risk Adult  Completed      Plan:  Exercise for at least 150 minutes a week Flu shot given today Keep f/u with PCP Colon cancer screening discussed   I have personally reviewed and noted the following in the patient's chart:   . Medical and social history . Use of alcohol, tobacco or illicit drugs  . Current medications and supplements . Functional ability and status . Nutritional status . Physical activity . Advanced directives . List of other physicians . Hospitalizations, surgeries, and ER visits in previous 12 months . Vitals . Screenings to include cognitive, depression, and falls . Referrals and appointments  In addition, I have reviewed and discussed with patient certain preventive protocols, quality metrics, and best practice recommendations. A written personalized care plan for preventive services as well as general preventive health recommendations were provided to patient.      Chong Sicilian, RN  08/26/2017  I have reviewed and agree with the above AWV documentation.   Arrie Senate MD

## 2017-10-04 DIAGNOSIS — H04123 Dry eye syndrome of bilateral lacrimal glands: Secondary | ICD-10-CM | POA: Diagnosis not present

## 2017-10-04 DIAGNOSIS — Z961 Presence of intraocular lens: Secondary | ICD-10-CM | POA: Diagnosis not present

## 2017-10-04 DIAGNOSIS — H353131 Nonexudative age-related macular degeneration, bilateral, early dry stage: Secondary | ICD-10-CM | POA: Diagnosis not present

## 2017-10-04 DIAGNOSIS — H26493 Other secondary cataract, bilateral: Secondary | ICD-10-CM | POA: Diagnosis not present

## 2017-10-04 DIAGNOSIS — H31093 Other chorioretinal scars, bilateral: Secondary | ICD-10-CM | POA: Diagnosis not present

## 2017-10-04 DIAGNOSIS — H43811 Vitreous degeneration, right eye: Secondary | ICD-10-CM | POA: Diagnosis not present

## 2017-10-23 ENCOUNTER — Encounter: Payer: Self-pay | Admitting: Family Medicine

## 2017-10-23 ENCOUNTER — Ambulatory Visit: Payer: PPO | Admitting: Family Medicine

## 2017-10-23 VITALS — BP 107/60 | HR 78 | Temp 97.0°F | Ht 65.5 in | Wt 143.0 lb

## 2017-10-23 DIAGNOSIS — B349 Viral infection, unspecified: Secondary | ICD-10-CM | POA: Diagnosis not present

## 2017-10-23 DIAGNOSIS — E559 Vitamin D deficiency, unspecified: Secondary | ICD-10-CM

## 2017-10-23 DIAGNOSIS — I10 Essential (primary) hypertension: Secondary | ICD-10-CM | POA: Diagnosis not present

## 2017-10-23 DIAGNOSIS — Z86018 Personal history of other benign neoplasm: Secondary | ICD-10-CM

## 2017-10-23 DIAGNOSIS — I480 Paroxysmal atrial fibrillation: Secondary | ICD-10-CM

## 2017-10-23 DIAGNOSIS — E279 Disorder of adrenal gland, unspecified: Secondary | ICD-10-CM

## 2017-10-23 DIAGNOSIS — E7849 Other hyperlipidemia: Secondary | ICD-10-CM | POA: Diagnosis not present

## 2017-10-23 DIAGNOSIS — C73 Malignant neoplasm of thyroid gland: Secondary | ICD-10-CM | POA: Diagnosis not present

## 2017-10-23 MED ORDER — DOXYCYCLINE HYCLATE 100 MG PO TABS: 100.0000 mg | ORAL_TABLET | Freq: Two times a day (BID) | ORAL | 0 refills | Status: DC

## 2017-10-23 NOTE — Patient Instructions (Addendum)
Medicare Annual Wellness Visit  [facility] and the medical providers at [facility] strive to bring you the best medical care.  In doing so we not only want to address your current medical conditions and concerns but also to detect new conditions early and prevent illness, disease and health-related problems.    Medicare offers a yearly Wellness Visit which allows our clinical staff to assess your need for preventative services including immunizations, lifestyle education, counseling to decrease risk of preventable diseases and screening for fall risk and other medical concerns.    This visit is provided free of charge (no copay) for all Medicare recipients. The clinical pharmacists at [facility] have begun to conduct these Wellness Visits which will also include a thorough review of all your medications.    As you primary medical provider recommend that you make an appointment for your Annual Wellness Visit if you have not done so already this year.  You may set up this appointment before you leave today or you may call back ([phone]) and schedule an appointment.  Please make sure when you call that you mention that you are scheduling your Annual Wellness Visit with the clinical pharmacist so that the appointment may be made for the proper length of time.     Continue current medications. Continue good therapeutic lifestyle changes which include good diet and exercise. Fall precautions discussed with patient. If an FOBT was given today- please return it to our front desk. If you are over 56 years old - you may need Prevnar 63 or the adult Pneumonia vaccine.  **Flu shots are available--- please call and schedule a FLU-CLINIC appointment**  After your visit with [place] today you will receive a survey in the mail or online from [employer] regarding your care with [place]. Please take a moment to fill this out. Your feedback is very  important to [place] as you can help [place] better understand your patient needs as well as improve your experience and satisfaction. WE CARE ABOUT YOU!!!  Drink plenty of fluids and stay well-hydrated Take Mucinex twice daily with a large glass of water Continue to use nasal saline gel and spray Take Tylenol for aches pains and fever We will give you a prescription for doxycycline to take if your condition worsens but wait at least a week and if he gets better within that time you do not have to take the prescription and have it filled. Keep the house as cool as possible

## 2017-10-23 NOTE — Progress Notes (Signed)
Subjective:    Patient ID: [name], female    DOB: 10-17-1946, 71 y.o.   MRN: [id number]  HPI Pt here for follow up and management of chronic medical problems which includes hyperlipidemia and hypertension. She is taking medication regularly.  The patient is doing well overall and only complains of some nasal drainage and congestion.  She is due to get lab work today and will be given an FOBT to return.  Her vital signs are stable and her weight is up 4 pounds.  The patient has a history of pheochromocytoma and thyroid cancer.  Patient is pleasant and in good spirits.  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  She sees the cardiologist yearly because of the past history of atrial fibrillation especially when she had her pheochromocytoma.  She has not had any bouts of this since that time.  She also occasionally sees the endocrinologist regarding her thyroid but has not seen him recently and does not plan to go back unless she has any problems.  She is on thyroid replacement from having thyroid cancer in the past.  She denies any chest pain or shortness of breath.  She denies any trouble with swallowing heartburn indigestion nausea vomiting diarrhea blood in the stool black tarry bowel movements or change in bowel habits.  She is passing her water without problems.  She is up-to-date on her pelvic exams and mammograms.  The patient just lost her younger sister with lymphoma and she is the only one left in the family.  She does have a daughter and 2 grandchildren.    Patient Active Problem List   Diagnosis Date Noted  . 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 Personal history of pheochromocytoma 02/17/2014  . Cold intolerance 02/17/2014  . Vitamin D deficiency 10/21/2013  . 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 Hypothyroidism 10/21/2013  . Thyroid cancer ([place]) 06/18/2013  . High risk medication use 06/18/2013  . Sinus  tachycardia 07/06/2012  . Hypokalemia 07/06/2012  . Hypertension 04/10/2010  . Atrial fibrillation ([place]) 04/04/2010  . Mitral valve disorder 03/28/2010  . 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 Artificial Tear Ointment (REFRESH LACRI-LUBE) OINT Apply 1 inch to eye at bedtime. Apply to bottom lid  . aspirin 325 MG EC tablet Take 325 mg by mouth daily.   . Cholecalciferol (VITAMIN D3) 2000 UNITS TABS Take 1 capsule by mouth daily.   . flecainide (TAMBOCOR) 50 MG tablet TAKE 1 TABLET (50 MG TOTAL) BY MOUTH 2 (TWO) TIMES DAILY.  Marland Kitchen loratadine (CLARITIN) 10 MG tablet Take 10 mg by mouth daily as needed for allergies.   . Multiple Vitamins-Minerals (CENTRUM SILVER) tablet Take 1 tablet by mouth every morning.   . Multiple Vitamins-Minerals (PRESERVISION/LUTEIN PO) Take 1 capsule by mouth daily.  . Omega-3 Fatty Acids (FISH OIL) 1200 MG CAPS Take 1 capsule by mouth daily.  Marland Kitchen SYNTHROID 150 MCG tablet TAKE 1 TABLET (150 MCG TOTAL) BY MOUTH AS DIRECTED.   No facility-administered encounter medications on file as of 10/23/2017.       Review of Systems  Constitutional: Negative.   HENT: Positive for postnasal drip.   Eyes: Negative.   Respiratory: Negative.   Cardiovascular: Negative.   Gastrointestinal: Negative.   Endocrine: Negative.   Genitourinary: Negative.   Musculoskeletal: Negative.   Skin: Negative.   Allergic/Immunologic: Negative.   Neurological:  Negative.   Hematological: Negative.   Psychiatric/Behavioral: Negative.        Objective:   Physical Exam  Constitutional: She is oriented to person, place, and time. She appears well-developed and well-nourished. No distress.  HENT:  Head: Normocephalic and atraumatic.  Right Ear: External ear normal.  Left Ear: External ear normal.  Mouth/Throat: Oropharynx is clear and moist.  Nasal congestion and turbinate swelling bilaterally  Eyes: Conjunctivae  and EOM are normal. Pupils are equal, round, and reactive to light. Right eye exhibits no discharge. Left eye exhibits no discharge. No scleral icterus.  Neck: Normal range of motion. Neck supple. No thyromegaly present.  No thyromegaly and slight anterior cervical tenderness on the right  Cardiovascular: Normal rate, regular rhythm, normal heart sounds and intact distal pulses.  No murmur heard. Heart is regular at 72/min  Pulmonary/Chest: Effort normal and breath sounds normal. No respiratory distress. She has no wheezes. She has no rales.  Clear anteriorly and posteriorly and dry cough  Abdominal: Soft. Bowel sounds are normal. She exhibits no mass. There is no tenderness. There is no rebound and no guarding.  No liver or spleen enlargement no abdominal tenderness epigastric tenderness or suprapubic tenderness and good inguinal pulses with no inguinal adenopathy  Musculoskeletal: Normal range of motion. She exhibits no edema.  Lymphadenopathy:    She has no cervical adenopathy.  Neurological: She is alert and oriented to person, place, and time. She has normal reflexes. No cranial nerve deficit.  Skin: Skin is warm and dry. No rash noted.  Psychiatric: She has a normal mood and affect. Her behavior is normal. Judgment and thought content normal.  Nursing note and vitals reviewed.  BP 107/60 (BP Location: Left Arm)   Pulse 78   Temp (!) 97 F (36.1 C) (Oral)   Ht 5' 5.5" (1.664 m)   Wt 143 lb (64.9 kg)   BMI 23.43 kg/m         Assessment & Plan:  1. Hypertension -Blood pressure is good today and she should continue with current treatment - BMP8+EGFR - CBC with Differential/Platelet - Hepatic function panel  2. Other hyperlipidemia -Continue with aggressive therapeutic lifestyle changes - Lipid panel - CBC with Differential/Platelet  3. Vitamin D deficiency -Continue with vitamin D replacement pending results of lab work - CBC with Differential/Platelet - VITAMIN D 25  Hydroxy (Vit-D Deficiency, Fractures)  4. Thyroid cancer ([place]) -Continue with thyroid suppression with current treatment pending results of lab work - CBC with Differential/Platelet - Thyroid Panel With TSH  5. 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 Viral syndrome -Drink fluids, keep the house as cool as possible, use Mucinex and nasal saline and take Tylenol for aches pains and fever  7.  Paroxysmal atrial fibrillation -Follow-up with cardiology as planned  8.  Personal history of pheochromocytoma  No orders of the defined types were placed in this encounter.  Patient Instructions                       Medicare Annual Wellness Visit  [facility] and the medical providers at [facility] strive to bring you the best medical care.  In doing so we not only want to address your current medical conditions and concerns but also to detect new conditions early and prevent illness, disease and health-related problems.    Medicare offers a yearly Wellness Visit which allows  our clinical staff to assess your need for preventative services including immunizations, lifestyle education, counseling to decrease risk of preventable diseases and screening for fall risk and other medical concerns.    This visit is provided free of charge (no copay) for all Medicare recipients. The clinical pharmacists at [facility] have begun to conduct these Wellness Visits which will also include a thorough review of all your medications.    As you primary medical provider recommend that you make an appointment for your Annual Wellness Visit if you have not done so already this year.  You may set up this appointment before you leave today or you may call back ([phone]) and schedule an appointment.  Please make sure when you call that you mention that you are scheduling your Annual  Wellness Visit with the clinical pharmacist so that the appointment may be made for the proper length of time.     Continue current medications. Continue good therapeutic lifestyle changes which include good diet and exercise. Fall precautions discussed with patient. If an FOBT was given today- please return it to our front desk. If you are over 74 years old - you may need Prevnar 62 or the adult Pneumonia vaccine.  **Flu shots are available--- please call and schedule a FLU-CLINIC appointment**  After your visit with [place] today you will receive a survey in the mail or online from [employer] regarding your care with [place]. Please take a moment to fill this out. Your feedback is very important to [place] as you can help [place] better understand your patient needs as well as improve your experience and satisfaction. WE CARE ABOUT YOU!!!  Drink plenty of fluids and stay well-hydrated Take Mucinex twice daily with a large glass of water Continue to use nasal saline gel and spray Take Tylenol for aches pains and fever We will give you a prescription for doxycycline to take if your condition worsens but wait at least a week and if he gets better within that time you do not have to take the prescription and have it filled. Keep the house as cool as possible   Arrie Senate MD

## 2017-10-23 NOTE — Addendum Note (Signed)
Addended by: Zannie Cove on: 10/23/2017 10:22 AM   Modules accepted: Orders

## 2017-10-24 LAB — LIPID PANEL
CHOLESTEROL TOTAL: 177 mg/dL (ref 100–199)
Chol/HDL Ratio: 1.6 ratio (ref 0.0–4.4)
HDL: 110 mg/dL (ref >39)
LDL CALC: 60 mg/dL (ref 0–99)
TRIGLYCERIDES: 33 mg/dL (ref 0–149)
VLDL CHOLESTEROL CAL: 7 mg/dL (ref 5–40)

## 2017-10-24 LAB — CBC WITH DIFFERENTIAL/PLATELET
BASOS: 0 %
Basophils Absolute: 0 10*3/uL (ref 0.0–0.2)
EOS (ABSOLUTE): 0.3 10*3/uL (ref 0.0–0.4)
Eos: 5 %
Hematocrit: 39.7 % (ref 34.0–46.6)
Hemoglobin: 13.6 g/dL (ref 11.1–15.9)
IMMATURE GRANS (ABS): 0 10*3/uL (ref 0.0–0.1)
Immature Granulocytes: 0 %
LYMPHS: 23 %
Lymphocytes Absolute: 1.7 10*3/uL (ref 0.7–3.1)
MCH: 31.1 pg (ref 26.6–33.0)
MCHC: 34.3 g/dL (ref 31.5–35.7)
MCV: 91 fL (ref 79–97)
MONOS ABS: 0.7 10*3/uL (ref 0.1–0.9)
Monocytes: 10 %
NEUTROS ABS: 4.6 10*3/uL (ref 1.4–7.0)
Neutrophils: 62 %
PLATELETS: 172 10*3/uL (ref 150–379)
RBC: 4.37 x10E6/uL (ref 3.77–5.28)
RDW: 13.6 % (ref 12.3–15.4)
WBC: 7.3 10*3/uL (ref 3.4–10.8)

## 2017-10-24 LAB — HEPATIC FUNCTION PANEL
ALT: 18 IU/L (ref 0–32)
AST: 27 IU/L (ref 0–40)
Albumin: 4.4 g/dL (ref 3.5–4.8)
Alkaline Phosphatase: 84 IU/L (ref 39–117)
BILIRUBIN, DIRECT: 0.19 mg/dL (ref 0.00–0.40)
Bilirubin Total: 0.7 mg/dL (ref 0.0–1.2)
Total Protein: 7.2 g/dL (ref 6.0–8.5)

## 2017-10-24 LAB — THYROID PANEL WITH TSH
FREE THYROXINE INDEX: 3.2 (ref 1.2–4.9)
T3 UPTAKE RATIO: 31 % (ref 24–39)
T4, Total: 10.2 ug/dL (ref 4.5–12.0)

## 2017-10-24 LAB — BMP8+EGFR
BUN/Creatinine Ratio: 15 (ref 12–28)
BUN: 10 mg/dL (ref 8–27)
CO2: 26 mmol/L (ref 20–29)
CREATININE: 0.65 mg/dL (ref 0.57–1.00)
Calcium: 9.6 mg/dL (ref 8.7–10.3)
Chloride: 101 mmol/L (ref 96–106)
GFR calc Af Amer: 103 mL/min/{1.73_m2} (ref >59)
GFR calc non Af Amer: 90 mL/min/{1.73_m2} (ref >59)
GLUCOSE: 83 mg/dL (ref 65–99)
Potassium: 4.3 mmol/L (ref 3.5–5.2)
Sodium: 139 mmol/L (ref 134–144)

## 2017-10-24 LAB — VITAMIN D 25 HYDROXY (VIT D DEFICIENCY, FRACTURES): Vit D, 25-Hydroxy: 49.3 ng/mL (ref 30.0–100.0)

## 2017-12-02 DIAGNOSIS — H348112 Central retinal vein occlusion, right eye, stable: Secondary | ICD-10-CM | POA: Diagnosis not present

## 2017-12-02 DIAGNOSIS — H35043 Retinal micro-aneurysms, unspecified, bilateral: Secondary | ICD-10-CM | POA: Diagnosis not present

## 2017-12-02 DIAGNOSIS — H43391 Other vitreous opacities, right eye: Secondary | ICD-10-CM | POA: Diagnosis not present

## 2017-12-02 DIAGNOSIS — Z8619 Personal history of other infectious and parasitic diseases: Secondary | ICD-10-CM | POA: Diagnosis not present

## 2018-01-18 ENCOUNTER — Other Ambulatory Visit: Payer: Self-pay | Admitting: Cardiology

## 2018-01-20 NOTE — Telephone Encounter (Signed)
Rx has been sent to the pharmacy electronically. ° °

## 2018-01-27 DIAGNOSIS — Z1231 Encounter for screening mammogram for malignant neoplasm of breast: Secondary | ICD-10-CM | POA: Diagnosis not present

## 2018-01-27 LAB — HM MAMMOGRAPHY

## 2018-02-10 DIAGNOSIS — R922 Inconclusive mammogram: Secondary | ICD-10-CM | POA: Diagnosis not present

## 2018-02-10 DIAGNOSIS — R928 Other abnormal and inconclusive findings on diagnostic imaging of breast: Secondary | ICD-10-CM | POA: Diagnosis not present

## 2018-03-06 ENCOUNTER — Ambulatory Visit (INDEPENDENT_AMBULATORY_CARE_PROVIDER_SITE_OTHER): Payer: PPO | Admitting: Family Medicine

## 2018-03-06 ENCOUNTER — Encounter: Payer: Self-pay | Admitting: Family Medicine

## 2018-03-06 VITALS — BP 102/61 | HR 75 | Temp 96.7°F | Ht 65.5 in | Wt 144.0 lb

## 2018-03-06 DIAGNOSIS — E559 Vitamin D deficiency, unspecified: Secondary | ICD-10-CM | POA: Diagnosis not present

## 2018-03-06 DIAGNOSIS — C73 Malignant neoplasm of thyroid gland: Secondary | ICD-10-CM | POA: Diagnosis not present

## 2018-03-06 DIAGNOSIS — E7849 Other hyperlipidemia: Secondary | ICD-10-CM | POA: Diagnosis not present

## 2018-03-06 DIAGNOSIS — I10 Essential (primary) hypertension: Secondary | ICD-10-CM

## 2018-03-06 DIAGNOSIS — Z118 Encounter for screening for other infectious and parasitic diseases: Secondary | ICD-10-CM | POA: Diagnosis not present

## 2018-03-06 DIAGNOSIS — I480 Paroxysmal atrial fibrillation: Secondary | ICD-10-CM

## 2018-03-06 DIAGNOSIS — Z86018 Personal history of other benign neoplasm: Secondary | ICD-10-CM | POA: Diagnosis not present

## 2018-03-06 DIAGNOSIS — W57XXXA Bitten or stung by nonvenomous insect and other nonvenomous arthropods, initial encounter: Secondary | ICD-10-CM | POA: Diagnosis not present

## 2018-03-06 MED ORDER — DOXYCYCLINE HYCLATE 100 MG PO TABS: 100.0000 mg | ORAL_TABLET | Freq: Two times a day (BID) | ORAL | 0 refills | Status: DC

## 2018-03-06 NOTE — Patient Instructions (Addendum)
Medicare Annual Wellness Visit  [facility] and the medical providers at [facility] strive to bring you the best medical care.  In doing so we not only want to address your current medical conditions and concerns but also to detect new conditions early and prevent illness, disease and health-related problems.    Medicare offers a yearly Wellness Visit which allows our clinical staff to assess your need for preventative services including immunizations, lifestyle education, counseling to decrease risk of preventable diseases and screening for fall risk and other medical concerns.    This visit is provided free of charge (no copay) for all Medicare recipients. The clinical pharmacists at [facility] have begun to conduct these Wellness Visits which will also include a thorough review of all your medications.    As you primary medical provider recommend that you make an appointment for your Annual Wellness Visit if you have not done so already this year.  You may set up this appointment before you leave today or you may call back ([phone]) and schedule an appointment.  Please make sure when you call that you mention that you are scheduling your Annual Wellness Visit with the clinical pharmacist so that the appointment may be made for the proper length of time.     Continue current medications. Continue good therapeutic lifestyle changes which include good diet and exercise. Fall precautions discussed with patient. If an FOBT was given today- please return it to our front desk. If you are over 37 years old - you may need Prevnar 105 or the adult Pneumonia vaccine.  **Flu shots are available--- please call and schedule a FLU-CLINIC appointment**  After your visit with [place] today you will receive a survey in the mail or online from [employer] regarding your care with [place]. Please take a moment to fill this out. Your feedback is very  important to [place] as you can help [place] better understand your patient needs as well as improve your experience and satisfaction. WE CARE ABOUT YOU!!!   Please check yourself regularly for tics and tick bites Take the antibiotic as directed twice daily with food for 3 weeks Consider doing the Cologuard test for colon cancer Call back and let us know if you decide to do this. Continue to follow-up with cardiology and endocrinology as needed.

## 2018-03-06 NOTE — Progress Notes (Signed)
Subjective:    Patient ID: [name], female    DOB: July 05, 1946, 72 y.o.   MRN: [id number]  HPI Pt here for follow up and management of chronic medical problems which includes hypertension and hyperlipidemia. She is taking medication regularly.  The patient is doing well today with no specific complaints.  Her vital signs are stable.  She has a history of thyroid cancer and pheochromocytoma.  She will be given an FOBT to return and will get lab work today.  She continues to be followed periodically by the cardiologist and the endocrinologist.  Both she and her husband are retired.  She has a history also of atrial fibrillation.  Patient is pleasant and doing well.  She has had several tick bites and one was a [place] tick and several with deer ticks.  The [place] tick was the most recently and this was on her abdomen.  The patient denies any chest pain pressure tightness or palpitations.  She is due to see the cardiologist soon and will make an appointment to see him.  She denies any trouble with her stomach including nausea vomiting diarrhea blood in the stool or black tarry bowel movements.  She is passing her water without problems.  She is considering doing a Cologuard test as she has never had a colonoscopy.  She will be given an FOBT card to return today.     Patient Active Problem List   Diagnosis Date Noted  . 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 Personal history of pheochromocytoma 02/17/2014  . Cold intolerance 02/17/2014  . Vitamin D deficiency 10/21/2013  . 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 Hypothyroidism 10/21/2013  . Thyroid cancer ([place]) 06/18/2013  . High risk medication use 06/18/2013  . Sinus tachycardia 07/06/2012  . Hypokalemia 07/06/2012  . Hypertension 04/10/2010  . Atrial fibrillation ([place]) 04/04/2010  . Mitral valve disorder 03/28/2010  . 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 Artificial Tear Ointment (REFRESH LACRI-LUBE) OINT Apply 1 inch to eye at bedtime. Apply to bottom lid  . aspirin 325 MG EC tablet Take 325 mg by mouth daily.   . Cholecalciferol (VITAMIN D3) 2000 UNITS TABS Take 1 capsule by mouth daily.   . flecainide (TAMBOCOR) 50 MG tablet TAKE 1 TABLET (50 MG TOTAL) BY MOUTH 2 (TWO) TIMES DAILY.  Marland Kitchen loratadine (CLARITIN) 10 MG tablet Take 10 mg by mouth daily as needed for allergies.   . Multiple Vitamins-Minerals (CENTRUM SILVER) tablet Take 1 tablet by mouth every morning.   . Multiple Vitamins-Minerals (PRESERVISION/LUTEIN PO) Take 1 capsule by mouth daily.  . Omega-3 Fatty Acids (FISH OIL) 1200 MG CAPS Take 1 capsule by mouth daily.  Marland Kitchen SYNTHROID 150 MCG tablet TAKE 1 TABLET (150 MCG TOTAL) BY MOUTH AS DIRECTED.  . [DISCONTINUED] doxycycline (VIBRA-TABS) 100 MG tablet Take 1 tablet (100 mg total) by mouth 2 (two) times daily. 1 po bid   No facility-administered encounter medications on file as of 03/06/2018.       Review of Systems  Constitutional: Negative.   HENT: Negative.   Eyes: Negative.   Respiratory: Negative.   Cardiovascular: Negative.   Gastrointestinal: Negative.   Endocrine: Negative.   Genitourinary: Negative.   Musculoskeletal: Negative.   Skin: Negative.   Allergic/Immunologic: Negative.   Neurological: Negative.   Hematological: Negative.   Psychiatric/Behavioral: Negative.        Objective:   Physical Exam  Constitutional: She is oriented to person, place, and time. She appears well-developed and well-nourished. No distress.  The patient is pleasant calm and relaxed  HENT:  Head: Normocephalic and atraumatic.  Right Ear: External ear normal.  Left Ear: External ear normal.  Mouth/Throat: Oropharynx is clear and moist. No oropharyngeal exudate.  Nasal turbinate congestion bilaterally  Eyes: Pupils are equal, round, and reactive to light. Conjunctivae and EOM are normal. Right eye exhibits  no discharge. Left eye exhibits no discharge. No scleral icterus.  Up-to-date on eye exams  Neck: Normal range of motion. Neck supple. No thyromegaly present.  Cardiovascular: Normal rate, regular rhythm, normal heart sounds and intact distal pulses.  No murmur heard. Heart is 84/min with a regular rate and rhythm  Pulmonary/Chest: Effort normal and breath sounds normal. No respiratory distress. She has no wheezes. She has no rales. She exhibits no tenderness.  Clear anteriorly and posteriorly  Abdominal: Soft. Bowel sounds are normal. She exhibits no mass. There is no tenderness. There is no rebound and no guarding.  No abdominal tenderness masses bruits or organ enlargement  Musculoskeletal: Normal range of motion. She exhibits no edema.  Lymphadenopathy:    She has no cervical adenopathy.  Neurological: She is alert and oriented to person, place, and time. She has normal reflexes. No cranial nerve deficit or sensory deficit.  Skin: Skin is warm and dry. No rash noted.  Psychiatric: She has a normal mood and affect. Her behavior is normal. Judgment and thought content normal.  She is calm and alert  Nursing note and vitals reviewed.   BP 102/61 (BP Location: Left Arm)   Pulse 75   Temp (!) 96.7 F (35.9 C) (Oral)   Ht 5' 5.5" (1.664 m)   Wt 144 lb (65.3 kg)   BMI 23.60 kg/m       Assessment & Plan:  1. Hypertension -The blood pressure is good and she will continue with current treatment - BMP8+EGFR - CBC with Differential/Platelet - Hepatic function panel  2. Other hyperlipidemia -Continue with aggressive therapeutic lifestyle changes and exercise as much as possible - Lipid panel - CBC with Differential/Platelet  3. Vitamin D deficiency -Continue with calcium and vitamin D replacement pending results of lab work - CBC with Differential/Platelet - VITAMIN D 25 Hydroxy (Vit-D Deficiency, Fractures)  4. Thyroid cancer ([place]) -Continue with current thyroid treatment  pending results of lab work - CBC with Differential/Platelet - Thyroid Panel With TSH  5. Paroxysmal atrial fibrillation (HCC) -Follow-up with cardiology as planned - CBC with Differential/Platelet  6. Personal history of pheochromocytoma -No rapid heart rates noted and blood pressure is stable  7. Tick bite, initial encounter -Doxycycline 100 mg twice daily with food for 3 weeks - [place] spotted fvr abs pnl(IgG+IgM) - Lyme Ab/Western Blot Reflex  Meds ordered this encounter  Medications  . doxycycline (VIBRA-TABS) 100 MG tablet    Sig: Take 1 tablet (100 mg total) by mouth 2 (two) times daily. 1 po bid    Dispense:  42 tablet    Refill:  0   Patient Instructions                       Medicare Annual Wellness Visit  [facility] and the medical providers at [facility] strive to bring you the best medical care.  In doing so we not only want to address your current medical conditions and concerns but also to detect new conditions early  and prevent illness, disease and health-related problems.    Medicare offers a yearly Wellness Visit which allows our clinical staff to assess your need for preventative services including immunizations, lifestyle education, counseling to decrease risk of preventable diseases and screening for fall risk and other medical concerns.    This visit is provided free of charge (no copay) for all Medicare recipients. The clinical pharmacists at [facility] have begun to conduct these Wellness Visits which will also include a thorough review of all your medications.    As you primary medical provider recommend that you make an appointment for your Annual Wellness Visit if you have not done so already this year.  You may set up this appointment before you leave today or you may call back ([phone]) and schedule an appointment.  Please make sure when you call that you mention that you are scheduling your Annual  Wellness Visit with the clinical pharmacist so that the appointment may be made for the proper length of time.     Continue current medications. Continue good therapeutic lifestyle changes which include good diet and exercise. Fall precautions discussed with patient. If an FOBT was given today- please return it to our front desk. If you are over 62 years old - you may need Prevnar 72 or the adult Pneumonia vaccine.  **Flu shots are available--- please call and schedule a FLU-CLINIC appointment**  After your visit with [place] today you will receive a survey in the mail or online from [employer] regarding your care with [place]. Please take a moment to fill this out. Your feedback is very important to [place] as you can help [place] better understand your patient needs as well as improve your experience and satisfaction. WE CARE ABOUT YOU!!!   Please check yourself regularly for tics and tick bites Take the antibiotic as directed twice daily with food for 3 weeks Consider doing the Cologuard test for colon cancer Call back and let us know if you decide to do this. Continue to follow-up with cardiology and endocrinology as needed.  Arrie Senate MD

## 2018-03-07 LAB — THYROID PANEL WITH TSH
Free Thyroxine Index: 3.3 (ref 1.2–4.9)
T3 Uptake Ratio: 33 % (ref 24–39)
T4 TOTAL: 9.9 ug/dL (ref 4.5–12.0)
TSH: 0.007 u[IU]/mL — AB (ref 0.450–4.500)

## 2018-03-07 LAB — LIPID PANEL
CHOL/HDL RATIO: 1.6 ratio (ref 0.0–4.4)
Cholesterol, Total: 171 mg/dL (ref 100–199)
HDL: 105 mg/dL (ref >39)
LDL CALC: 59 mg/dL (ref 0–99)
Triglycerides: 34 mg/dL (ref 0–149)
VLDL CHOLESTEROL CAL: 7 mg/dL (ref 5–40)

## 2018-03-07 LAB — BMP8+EGFR
BUN / CREAT RATIO: 17 (ref 12–28)
BUN: 10 mg/dL (ref 8–27)
CHLORIDE: 104 mmol/L (ref 96–106)
CO2: 27 mmol/L (ref 20–29)
CREATININE: 0.6 mg/dL (ref 0.57–1.00)
Calcium: 9.6 mg/dL (ref 8.7–10.3)
GFR calc Af Amer: 105 mL/min/{1.73_m2} (ref >59)
GFR calc non Af Amer: 91 mL/min/{1.73_m2} (ref >59)
GLUCOSE: 83 mg/dL (ref 65–99)
Potassium: 4.4 mmol/L (ref 3.5–5.2)
Sodium: 143 mmol/L (ref 134–144)

## 2018-03-07 LAB — CBC WITH DIFFERENTIAL/PLATELET
Basophils Absolute: 0 10*3/uL (ref 0.0–0.2)
Basos: 0 %
EOS (ABSOLUTE): 0.4 10*3/uL (ref 0.0–0.4)
EOS: 8 %
HEMATOCRIT: 39.6 % (ref 34.0–46.6)
Hemoglobin: 13.3 g/dL (ref 11.1–15.9)
Immature Grans (Abs): 0 10*3/uL (ref 0.0–0.1)
Immature Granulocytes: 0 %
LYMPHS ABS: 2 10*3/uL (ref 0.7–3.1)
Lymphs: 37 %
MCH: 30 pg (ref 26.6–33.0)
MCHC: 33.6 g/dL (ref 31.5–35.7)
MCV: 89 fL (ref 79–97)
Monocytes Absolute: 0.4 10*3/uL (ref 0.1–0.9)
Monocytes: 8 %
Neutrophils Absolute: 2.5 10*3/uL (ref 1.4–7.0)
Neutrophils: 47 %
PLATELETS: 180 10*3/uL (ref 150–379)
RBC: 4.43 x10E6/uL (ref 3.77–5.28)
RDW: 13.5 % (ref 12.3–15.4)
WBC: 5.4 10*3/uL (ref 3.4–10.8)

## 2018-03-07 LAB — HEPATIC FUNCTION PANEL
ALK PHOS: 83 IU/L (ref 39–117)
ALT: 17 IU/L (ref 0–32)
AST: 23 IU/L (ref 0–40)
Albumin: 4.3 g/dL (ref 3.5–4.8)
Bilirubin Total: 0.6 mg/dL (ref 0.0–1.2)
Bilirubin, Direct: 0.21 mg/dL (ref 0.00–0.40)
TOTAL PROTEIN: 7 g/dL (ref 6.0–8.5)

## 2018-03-07 LAB — VITAMIN D 25 HYDROXY (VIT D DEFICIENCY, FRACTURES): Vit D, 25-Hydroxy: 51.3 ng/mL (ref 30.0–100.0)

## 2018-03-10 LAB — ROCKY MTN SPOTTED FVR ABS PNL(IGG+IGM)
RMSF IGG: POSITIVE — AB
RMSF IGM: 0.51 {index} (ref 0.00–0.89)

## 2018-03-10 LAB — LYME AB/WESTERN BLOT REFLEX: Lyme IgG/IgM Ab: <0.91 {ISR} (ref 0.00–0.90)

## 2018-03-10 LAB — RMSF, IGG, IFA: RMSF, IGG, IFA: 1:64 {titer} — ABNORMAL HIGH

## 2018-04-19 ENCOUNTER — Other Ambulatory Visit: Payer: Self-pay | Admitting: Cardiology

## 2018-04-22 NOTE — Progress Notes (Signed)
HPI The patient presents for follow up of atrial fib.   She has had surgery for treatment of  pheochromocytoma.  She has moderate tricuspid regurgitation with no evidence of pulmonary hypertension.  In 2017 I ordered an echo which demonstrated continued moderate TR.  The EF was low normal and there was some mild MR two years ago on echo.  Since then she has felt well.  The patient denies any new symptoms such as chest discomfort, neck or arm discomfort. There has been no new shortness of breath, PND or orthopnea. There have been no reported palpitations, presyncope or syncope.  She does some walking and walks the stairs frequently.     Allergies  Allergen Reactions  . Penicillins Anaphylaxis  . Actonel [Risedronate Sodium]     Nausea and worsened heart burn  . Ceclor [Cefaclor] Hives  . Acetaminophen Nausea And Vomiting and Other (See Comments)    Headache  . Alendronate Sodium Nausea Only and Other (See Comments)    Heartburn, stomach upset  . Celecoxib Other (See Comments)    Stomach upset, heart burn  . Codeine Nausea And Vomiting and Other (See Comments)    Headache  . Ventolin [Kdc:Albuterol] Palpitations    Rapid heart beat    Current Outpatient Medications  Medication Sig Dispense Refill  . Artificial Tear Ointment (REFRESH LACRI-LUBE) OINT Apply 1 inch to eye at bedtime. Apply to bottom lid    . aspirin 325 MG EC tablet Take 325 mg by mouth daily.     . Cholecalciferol (VITAMIN D3) 2000 UNITS TABS Take 1 capsule by mouth daily.     . flecainide (TAMBOCOR) 50 MG tablet TAKE 1 TABLET BY MOUTH TWICE A DAY 30 tablet 0  . loratadine (CLARITIN) 10 MG tablet Take 10 mg by mouth daily as needed for allergies.     . Multiple Vitamins-Minerals (CENTRUM SILVER) tablet Take 1 tablet by mouth every morning.     . Multiple Vitamins-Minerals (PRESERVISION/LUTEIN PO) Take 1 capsule by mouth daily.    . Omega-3 Fatty Acids (FISH OIL) 1200 MG CAPS Take 1 capsule by mouth daily.    Marland Kitchen  SYNTHROID 150 MCG tablet TAKE 1 TABLET (150 MCG TOTAL) BY MOUTH AS DIRECTED. 30 tablet 10   No current facility-administered medications for this visit.     Past Medical History:  Diagnosis Date  . Atrial fibrillation (HCC)    PT ON FLECANIDE -DID NOT WANT TO BE ON BLOOD THINNERS OTHER THAN DAILY ASA  . Blurry vision, right eye    HX OF RT EYE VISION DISTURBANCE 01/2012 -PT SEEN BY DR. RANKIN--ELEVATED B/P  THOUGHT TO BE RELATED TO [place] SPOTTED FEVER THOUGHT TO BE CAUSE OF VISION PROBLEM-HAD LASER OF BOTH EYES (SLIGHT DAMAGE TO LEFT  EYE).    . Cataract    BILATERAL  . GERD (gastroesophageal reflux disease)   . Headache(784.0)    PAST HX MIGRAINES  . Hearing loss of right ear   . Hypertension    B/P HAS BEEN UP AND DOWN BUT MOSTLY DOWN WHILE ON METOPROLOL -WAS TAKEN OFF METOROLOL IN NOV 2012 BY CARDIOLOGIST AND THEN IN MARCH 2013 PT'S B/P BECAME REALLY ELEVATED-SHE RESTARTED METOPROLOL  AND WAS GIVEN BENICAR.  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 Hypothyroidism   . Mitral valve prolapse    per pt report, not listed on echo  . MR (mitral regurgitation)    none on echo in 2013  .  Osteoarthritis    LITTLE FINGER LEFT HAND  . Palpitations JUNE 2012   piror to diagnosis of SVT  -NO PROBLMS WITH REOCCURANCE  . Pheochromocytoma   . PONV (postoperative nausea and vomiting)    HX OF N&V AFTER SURGERIES-EXCEPT NO PROBLEM AFTER WRIST SURGERY IN 2012-AT [facility]  . Thyroid cancer [facility])    age 29; s/p resection with subsequent hypothyroidism. 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 TMJ locking    PT STATES IF MOUTH OPEN EXTENDED TIME-LIKE AT DENTIST--HER JAWS LOCK  . TR (tricuspid regurgitation)   . Vegetarian diet    DOES EAT EGGS AND DAIRY--NO MEATS OR FISH    Past Surgical History:  Procedure Laterality Date  . ADRENALECTOMY  07/03/2012   Procedure: ADRENALECTOMY;  Surgeon: Dutch Gray, MD;  Location: [facility];  Service:  Urology;  Laterality: Right;  . APPENDECTOMY    . Lung biopsies    . THYROIDECTOMY    . WRIST SURGERY      ROS:   As stated in the HPI and negative for all other systems.  PHYSICAL EXAM BP 122/62   Pulse 75   Ht 5\' 6"  (1.676 m)   Wt 145 lb (65.8 kg)   BMI 23.40 kg/m   GENERAL:  Well appearing NECK:  No jugular venous distention, waveform within normal limits, carotid upstroke brisk and symmetric, no bruits, no thyromegaly LUNGS:  Clear to auscultation bilaterally CHEST:  Unremarkable HEART:  PMI not displaced or sustained,S1 and S2 within normal limits, no S3, no S4, no clicks, no rubs, very soft short apical systolic murmur radiating slightly at the aortic outflow tract, no diastolic murmurs ABD:  Flat, positive bowel sounds normal in frequency in pitch, no bruits, no rebound, no guarding, no midline pulsatile mass, no hepatomegaly, no splenomegaly EXT:  2 plus pulses throughout, no edema, no cyanosis no clubbing , Varicose veins   EKG: Sinus rhythm, rate 75,LBBB, new since previous EKG. 04/23/2018   ASSESSMENT AND PLAN   HYPERTENSION -  The blood pressure is at target. No change in medications is indicated. We will continue with therapeutic lifestyle changes (TLC).  ATRIAL FIBRILLATION -  She has had no paroxysms of this.   Of note she is not on calcium channel blocker or beta-blocker but she is been very sensitive to these medications and has not wanted to take these.  I think it is important to keep her in regular rhythm.  Therefore, going to go ahead and continue the flecainide.   TRICUSPID REGURGITATION - I will be following up with an echocardiogram.    MITRAL REGURGITATION -  This will be followed as above.   LBBB - This is new since previous EKG.  However, she has no bifasicular block or significant bradyarrhythmias.  She can continue the very low dose flecainide.

## 2018-04-23 ENCOUNTER — Encounter: Payer: Self-pay | Admitting: Cardiology

## 2018-04-23 ENCOUNTER — Ambulatory Visit: Payer: PPO | Admitting: Cardiology

## 2018-04-23 VITALS — BP 122/62 | HR 75 | Ht 66.0 in | Wt 145.0 lb

## 2018-04-23 DIAGNOSIS — I361 Nonrheumatic tricuspid (valve) insufficiency: Secondary | ICD-10-CM

## 2018-04-23 DIAGNOSIS — R011 Cardiac murmur, unspecified: Secondary | ICD-10-CM | POA: Diagnosis not present

## 2018-04-23 DIAGNOSIS — I10 Essential (primary) hypertension: Secondary | ICD-10-CM

## 2018-04-23 MED ORDER — FLECAINIDE ACETATE 50 MG PO TABS: 50.0000 mg | ORAL_TABLET | Freq: Two times a day (BID) | ORAL | 3 refills | Status: DC

## 2018-04-23 NOTE — Patient Instructions (Signed)
Medication Instructions:  The current medical regimen is effective;  continue present plan and medications.  Testing/Procedures: Your physician has requested that you have an echocardiogram. Echocardiography is a painless test that uses sound waves to create images of your heart. It provides your doctor with information about the size and shape of your heart and how well your heart's chambers and valves are working. This procedure takes approximately one hour. There are no restrictions for this procedure.  Follow-Up: Follow up in 1 year with Dr. Hochrein.  You will receive a letter in the mail 2 months before you are due.  Please call us when you receive this letter to schedule your follow up appointment.  If you need a refill on your cardiac medications before your next appointment, please call your pharmacy.  Thank you for choosing [facility]!!     

## 2018-04-24 ENCOUNTER — Encounter: Payer: Self-pay | Admitting: Cardiology

## 2018-04-24 DIAGNOSIS — R011 Cardiac murmur, unspecified: Secondary | ICD-10-CM | POA: Insufficient documentation

## 2018-04-25 ENCOUNTER — Encounter: Payer: Self-pay | Admitting: Cardiology

## 2018-04-25 ENCOUNTER — Other Ambulatory Visit: Payer: Self-pay | Admitting: Family Medicine

## 2018-04-30 ENCOUNTER — Ambulatory Visit (HOSPITAL_COMMUNITY): Payer: PPO | Attending: Cardiology

## 2018-04-30 ENCOUNTER — Other Ambulatory Visit: Payer: Self-pay

## 2018-04-30 DIAGNOSIS — I4891 Unspecified atrial fibrillation: Secondary | ICD-10-CM | POA: Diagnosis not present

## 2018-04-30 DIAGNOSIS — R011 Cardiac murmur, unspecified: Secondary | ICD-10-CM | POA: Diagnosis not present

## 2018-04-30 DIAGNOSIS — I10 Essential (primary) hypertension: Secondary | ICD-10-CM | POA: Insufficient documentation

## 2018-04-30 DIAGNOSIS — I081 Rheumatic disorders of both mitral and tricuspid valves: Secondary | ICD-10-CM | POA: Insufficient documentation

## 2018-04-30 DIAGNOSIS — I361 Nonrheumatic tricuspid (valve) insufficiency: Secondary | ICD-10-CM

## 2018-06-27 ENCOUNTER — Encounter: Payer: Self-pay | Admitting: *Deleted

## 2018-07-10 ENCOUNTER — Ambulatory Visit: Payer: PPO | Admitting: Family Medicine

## 2018-07-16 ENCOUNTER — Ambulatory Visit (INDEPENDENT_AMBULATORY_CARE_PROVIDER_SITE_OTHER): Payer: PPO | Admitting: Family Medicine

## 2018-07-16 ENCOUNTER — Encounter: Payer: Self-pay | Admitting: Family Medicine

## 2018-07-16 VITALS — BP 91/57 | HR 70 | Temp 97.0°F | Ht 66.0 in | Wt 145.0 lb

## 2018-07-16 DIAGNOSIS — I480 Paroxysmal atrial fibrillation: Secondary | ICD-10-CM

## 2018-07-16 DIAGNOSIS — C73 Malignant neoplasm of thyroid gland: Secondary | ICD-10-CM | POA: Diagnosis not present

## 2018-07-16 DIAGNOSIS — E7849 Other hyperlipidemia: Secondary | ICD-10-CM

## 2018-07-16 DIAGNOSIS — E559 Vitamin D deficiency, unspecified: Secondary | ICD-10-CM | POA: Diagnosis not present

## 2018-07-16 DIAGNOSIS — Z86018 Personal history of other benign neoplasm: Secondary | ICD-10-CM

## 2018-07-16 DIAGNOSIS — I10 Essential (primary) hypertension: Secondary | ICD-10-CM

## 2018-07-16 NOTE — Patient Instructions (Addendum)
Medicare Annual Wellness Visit  [facility] and the medical providers at [facility] strive to bring you the best medical care.  In doing so we not only want to address your current medical conditions and concerns but also to detect new conditions early and prevent illness, disease and health-related problems.    Medicare offers a yearly Wellness Visit which allows our clinical staff to assess your need for preventative services including immunizations, lifestyle education, counseling to decrease risk of preventable diseases and screening for fall risk and other medical concerns.    This visit is provided free of charge (no copay) for all Medicare recipients. The clinical pharmacists at [facility] have begun to conduct these Wellness Visits which will also include a thorough review of all your medications.    As you primary medical provider recommend that you make an appointment for your Annual Wellness Visit if you have not done so already this year.  You may set up this appointment before you leave today or you may call back ([phone]) and schedule an appointment.  Please make sure when you call that you mention that you are scheduling your Annual Wellness Visit with the clinical pharmacist so that the appointment may be made for the proper length of time.     Continue current medications. Continue good therapeutic lifestyle changes which include good diet and exercise. Fall precautions discussed with patient. If an FOBT was given today- please return it to our front desk. If you are over 18 years old - you may need Prevnar 41 or the adult Pneumonia vaccine.  **Flu shots are available--- please call and schedule a FLU-CLINIC appointment**  After your visit with [place] today you will receive a survey in the mail or online from [employer] regarding your care with [place]. Please take a moment to fill this out. Your feedback is very  important to [place] as you can help [place] better understand your patient needs as well as improve your experience and satisfaction. WE CARE ABOUT YOU!!!   The patient should continue to drink plenty of fluids and stay well-hydrated. We will call with lab work results as soon is that become available. Drink plenty of water Wear support hose daily. Watch sodium intake. Keep regular appointments with the cardiologist every spring or more often if necessary Check with insurance regarding the shingrix

## 2018-07-16 NOTE — Progress Notes (Signed)
Subjective:    Patient ID: [name], female    DOB: [birth year], 72 y.o.   MRN: [id number]  HPI Pt here for follow up and management of chronic medical problems which includes hypertension and hyperlipidemia. She is taking medication regularly.  The patient is doing well today and has no specific complaints.  She did not need any refills today.  She is due to get a DEXA but wants to wait on this.  She was given an FOBT to return and she will get lab work today.  Her vital signs are stable her weight is stable and her body mass index is good at 23.4.  She is followed periodically by the cardiologist and rarely by the endocrinologist.  She just had a mammogram.  She is never had a colonoscopy.  She has mitral valve prolapse with regurgitation.  She has a history of a pheochromocytoma.  She is taking high doses of thyroid medicine because of thyroid cancer.  She has osteoporosis.  Patient is smiling pleasant and relaxed.  I been taking care of she her husband for years.  She sees the cardiologist once yearly in the spring time.  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   Patient Active Problem List   Diagnosis Date Noted  . Murmur, cardiac 04/24/2018  . 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 Personal history of pheochromocytoma 02/17/2014  . Cold intolerance 02/17/2014  . Vitamin D deficiency 10/21/2013  . 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 Hypothyroidism 10/21/2013  . Thyroid cancer ([place]) 06/18/2013  . High risk medication use 06/18/2013  . Sinus tachycardia 07/06/2012  . Hypokalemia 07/06/2012  . Hypertension 04/10/2010  . Atrial fibrillation ([place]) 04/04/2010  . Mitral valve disorder 03/28/2010  . 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 Artificial Tear Ointment (REFRESH LACRI-LUBE) OINT Apply 1 inch to eye at bedtime. Apply to  bottom lid  . aspirin 325 MG EC tablet Take 325 mg by mouth daily.   . Cholecalciferol (VITAMIN D3) 2000 UNITS TABS Take 1 capsule by mouth daily.   . flecainide (TAMBOCOR) 50 MG tablet Take 1 tablet (50 mg total) by mouth 2 (two) times daily.  Marland Kitchen loratadine (CLARITIN) 10 MG tablet Take 10 mg by mouth daily as needed for allergies.   . Multiple Vitamins-Minerals (CENTRUM SILVER) tablet Take 1 tablet by mouth every morning.   . Multiple Vitamins-Minerals (PRESERVISION/LUTEIN PO) Take 1 capsule by mouth daily.  . Omega-3 Fatty Acids (FISH OIL) 1200 MG CAPS Take 1 capsule by mouth daily.  Marland Kitchen SYNTHROID 150 MCG tablet TAKE 1 TABLET (150 MCG TOTAL) BY MOUTH AS DIRECTED.   No facility-administered encounter medications on file as of 07/16/2018.       Review of Systems  Constitutional: Negative.   HENT: Negative.   Eyes: Negative.   Respiratory: Negative.   Cardiovascular: Negative.   Gastrointestinal: Negative.   Endocrine: Negative.   Genitourinary: Negative.   Musculoskeletal: Negative.   Skin: Negative.   Allergic/Immunologic: Negative.   Neurological: Negative.   Hematological: Negative.   Psychiatric/Behavioral: Negative.        Objective:   Physical Exam  Constitutional: She is oriented to person, place, and time. She appears well-developed and well-nourished.  The patient is pleasant smiling and calm and has no specific complaints today.  She does not want to do a DEXA scan.  HENT:  Head: Normocephalic and atraumatic.  Right Ear: External ear normal.  Left Ear: External ear normal.  Nose: Nose normal.  Mouth/Throat: Oropharynx is clear and moist. No oropharyngeal exudate.  Eyes: Pupils are equal, round, and reactive to light. Conjunctivae and EOM are normal. Right eye exhibits no discharge. Left eye exhibits no discharge. No scleral icterus.  The patient gets eye exams every 6 months between Dr. Carolynn Sayers and Dr. Zadie Rhine.  Neck: Normal range of motion. Neck supple. No thyromegaly  present.  No bruits thyromegaly or anterior cervical adenopathy  Cardiovascular: Normal rate, regular rhythm, normal heart sounds and intact distal pulses.  No murmur heard. The heart is regular today at 84/min  Pulmonary/Chest: Effort normal and breath sounds normal. She has no wheezes. She has no rales.  Clear anteriorly and posteriorly  Abdominal: Soft. Bowel sounds are normal. She exhibits no mass. There is no tenderness.  No liver or spleen enlargement.  No epigastric tenderness.  No masses.  No inguinal adenopathy.  Musculoskeletal: Normal range of motion. She exhibits no edema or deformity.  Significant varicose veins in both lower extremities and ankles.  Otherwise no edema or deformity.  Lymphadenopathy:    She has no cervical adenopathy.  Neurological: She is alert and oriented to person, place, and time. She has normal reflexes. No cranial nerve deficit.  Reflexes are 2+ and equal bilaterally in lower extremities  Skin: Skin is warm and dry. No rash noted.  Psychiatric: She has a normal mood and affect. Her behavior is normal. Judgment and thought content normal.  Mood affect and behavior are normal for this person and she is upbeat in her personality.  Nursing note and vitals reviewed.   BP (!) 91/57 (BP Location: Left Arm)   Pulse 70   Temp (!) 97 F (36.1 C) (Oral)   Ht 5' 6"  (1.676 m)   Wt 145 lb (65.8 kg)   BMI 23.40 kg/m        Assessment & Plan:  1. Other hyperlipidemia -Continue with aggressive therapeutic lifestyle changes including diet and exercise and currently omega-3 fatty acids - CBC with Differential/Platelet - Lipid panel  2. Hypertension -Blood pressure is good today and she will continue with watching her sodium intake.  We will continue with the flecainide as prescribed by the cardiologist. - BMP8+EGFR - CBC with Differential/Platelet - Hepatic function panel  3. Vitamin D deficiency -Continue calcium and vitamin D replacement pending  results of lab work - CBC with Differential/Platelet - VITAMIN D 25 Hydroxy (Vit-D Deficiency, Fractures)  4. Thyroid cancer ([place]) -Continue with current thyroid medicine and refer back to Dr. Nicoletta Dress as needed - CBC with Differential/Platelet - Thyroid Panel With TSH  5. Paroxysmal atrial fibrillation ([facility]) -Patient was in normal sinus rhythm today at 84/min and she will continue to follow-up with cardiology every spring - CBC with Differential/Platelet  6. History of pheochromocytoma -No issues with this currently.   Patient Instructions                       Medicare Annual Wellness Visit  [facility] and the medical providers at [facility] strive to bring you the best medical care.  In doing so we not only want to address your current medical conditions and concerns but also to detect new conditions early and prevent illness, disease and health-related problems.    Medicare offers a yearly Wellness Visit which allows our clinical staff to assess your need  for preventative services including immunizations, lifestyle education, counseling to decrease risk of preventable diseases and screening for fall risk and other medical concerns.    This visit is provided free of charge (no copay) for all Medicare recipients. The clinical pharmacists at [facility] have begun to conduct these Wellness Visits which will also include a thorough review of all your medications.    As you primary medical provider recommend that you make an appointment for your Annual Wellness Visit if you have not done so already this year.  You may set up this appointment before you leave today or you may call back ([phone]) and schedule an appointment.  Please make sure when you call that you mention that you are scheduling your Annual Wellness Visit with the clinical pharmacist so that the appointment may be made for the proper length of time.     Continue current  medications. Continue good therapeutic lifestyle changes which include good diet and exercise. Fall precautions discussed with patient. If an FOBT was given today- please return it to our front desk. If you are over 72 years old - you may need Prevnar 74 or the adult Pneumonia vaccine.  **Flu shots are available--- please call and schedule a FLU-CLINIC appointment**  After your visit with [place] today you will receive a survey in the mail or online from [employer] regarding your care with [place]. Please take a moment to fill this out. Your feedback is very important to [place] as you can help [place] better understand your patient needs as well as improve your experience and satisfaction. WE CARE ABOUT YOU!!!   The patient should continue to drink plenty of fluids and stay well-hydrated. We will call with lab work results as soon is that become available. Drink plenty of water Wear support hose daily. Watch sodium intake. Keep regular appointments with the cardiologist every spring or more often if necessary Check with insurance regarding the shingrix  Arrie Senate MD

## 2018-07-17 ENCOUNTER — Telehealth: Payer: Self-pay | Admitting: *Deleted

## 2018-07-17 LAB — CBC WITH DIFFERENTIAL/PLATELET
BASOS: 1 %
Basophils Absolute: 0 10*3/uL (ref 0.0–0.2)
EOS (ABSOLUTE): 0.3 10*3/uL (ref 0.0–0.4)
EOS: 6 %
HEMATOCRIT: 41.4 % (ref 34.0–46.6)
HEMOGLOBIN: 13.7 g/dL (ref 11.1–15.9)
IMMATURE GRANULOCYTES: 0 %
Immature Grans (Abs): 0 10*3/uL (ref 0.0–0.1)
LYMPHS ABS: 1.7 10*3/uL (ref 0.7–3.1)
Lymphs: 31 %
MCH: 28.7 pg (ref 26.6–33.0)
MCHC: 33.1 g/dL (ref 31.5–35.7)
MCV: 87 fL (ref 79–97)
MONOCYTES: 11 %
Monocytes Absolute: 0.6 10*3/uL (ref 0.1–0.9)
Neutrophils Absolute: 2.8 10*3/uL (ref 1.4–7.0)
Neutrophils: 51 %
Platelets: 198 10*3/uL (ref 150–450)
RBC: 4.78 x10E6/uL (ref 3.77–5.28)
RDW: 12.2 % — ABNORMAL LOW (ref 12.3–15.4)
WBC: 5.4 10*3/uL (ref 3.4–10.8)

## 2018-07-17 LAB — LIPID PANEL
CHOL/HDL RATIO: 1.7 ratio (ref 0.0–4.4)
Cholesterol, Total: 186 mg/dL (ref 100–199)
HDL: 111 mg/dL (ref >39)
LDL CALC: 67 mg/dL (ref 0–99)
TRIGLYCERIDES: 39 mg/dL (ref 0–149)
VLDL Cholesterol Cal: 8 mg/dL (ref 5–40)

## 2018-07-17 LAB — HEPATIC FUNCTION PANEL
ALT: 20 IU/L (ref 0–32)
AST: 26 IU/L (ref 0–40)
Albumin: 4.6 g/dL (ref 3.5–4.8)
Alkaline Phosphatase: 89 IU/L (ref 39–117)
Bilirubin Total: 0.6 mg/dL (ref 0.0–1.2)
Bilirubin, Direct: 0.21 mg/dL (ref 0.00–0.40)
Total Protein: 7.3 g/dL (ref 6.0–8.5)

## 2018-07-17 LAB — BMP8+EGFR
BUN / CREAT RATIO: 18 (ref 12–28)
BUN: 11 mg/dL (ref 8–27)
CO2: 26 mmol/L (ref 20–29)
CREATININE: 0.61 mg/dL (ref 0.57–1.00)
Calcium: 10 mg/dL (ref 8.7–10.3)
Chloride: 101 mmol/L (ref 96–106)
GFR calc Af Amer: 105 mL/min/{1.73_m2} (ref >59)
GFR, EST NON AFRICAN AMERICAN: 91 mL/min/{1.73_m2} (ref >59)
GLUCOSE: 86 mg/dL (ref 65–99)
Potassium: 4.3 mmol/L (ref 3.5–5.2)
SODIUM: 141 mmol/L (ref 134–144)

## 2018-07-17 LAB — THYROID PANEL WITH TSH
Free Thyroxine Index: 4 (ref 1.2–4.9)
T3 UPTAKE RATIO: 35 % (ref 24–39)
T4 TOTAL: 11.4 ug/dL (ref 4.5–12.0)
TSH: <0.006 u[IU]/mL — ABNORMAL LOW (ref 0.450–4.500)

## 2018-07-17 LAB — VITAMIN D 25 HYDROXY (VIT D DEFICIENCY, FRACTURES): Vit D, 25-Hydroxy: 51.5 ng/mL (ref 30.0–100.0)

## 2018-07-17 NOTE — Telephone Encounter (Signed)
Pt notified of results Verbalizes understanding 

## 2018-07-17 NOTE — Telephone Encounter (Signed)
-----   Message from Chipper Herb, MD sent at 07/17/2018  9:48 AM EDT ----- The blood sugar is good at 86 the creatinine, the most important kidney function test is within normal limits.  All of the electrolytes including potassium are good. The CBC has a normal white blood cell count.  Hemoglobin is good at 13.7 and stable and the platelet count is adequate. All cholesterol numbers with traditional lipid testing are excellent and at goal with an LDL-C cholesterol or bad cholesterol being good at 67.  The good cholesterol is very excellent!  At 111.  Triglycerides are good at 39.----She should continue with healthy lifestyles and her omega-3 fatty acids. The vitamin D level remains excellent at 51.5 and she should continue with her current treatment regimen along with taking calcium.  Calcium should be 600 mg twice daily. All liver function tests are within normal limits The TSH remains low as this is patient's maintenance level because of the history of thyroid cancer.  She should continue with her current medicine as she is doing.

## 2018-08-29 ENCOUNTER — Encounter: Payer: PPO | Admitting: *Deleted

## 2018-09-01 ENCOUNTER — Ambulatory Visit (INDEPENDENT_AMBULATORY_CARE_PROVIDER_SITE_OTHER): Payer: PPO

## 2018-09-01 DIAGNOSIS — Z23 Encounter for immunization: Secondary | ICD-10-CM

## 2018-09-25 ENCOUNTER — Encounter: Payer: PPO | Admitting: *Deleted

## 2018-10-09 DIAGNOSIS — H31093 Other chorioretinal scars, bilateral: Secondary | ICD-10-CM | POA: Diagnosis not present

## 2018-10-09 DIAGNOSIS — H26493 Other secondary cataract, bilateral: Secondary | ICD-10-CM | POA: Diagnosis not present

## 2018-10-09 DIAGNOSIS — H353131 Nonexudative age-related macular degeneration, bilateral, early dry stage: Secondary | ICD-10-CM | POA: Diagnosis not present

## 2018-10-09 DIAGNOSIS — H04123 Dry eye syndrome of bilateral lacrimal glands: Secondary | ICD-10-CM | POA: Diagnosis not present

## 2018-10-09 DIAGNOSIS — H43811 Vitreous degeneration, right eye: Secondary | ICD-10-CM | POA: Diagnosis not present

## 2018-10-09 DIAGNOSIS — Z961 Presence of intraocular lens: Secondary | ICD-10-CM | POA: Diagnosis not present

## 2018-12-03 ENCOUNTER — Encounter: Payer: Self-pay | Admitting: Family Medicine

## 2018-12-03 ENCOUNTER — Ambulatory Visit (INDEPENDENT_AMBULATORY_CARE_PROVIDER_SITE_OTHER): Payer: PPO | Admitting: Family Medicine

## 2018-12-03 VITALS — BP 92/54 | HR 76 | Temp 96.7°F | Ht 66.0 in | Wt 147.0 lb

## 2018-12-03 DIAGNOSIS — Z86018 Personal history of other benign neoplasm: Secondary | ICD-10-CM

## 2018-12-03 DIAGNOSIS — I10 Essential (primary) hypertension: Secondary | ICD-10-CM

## 2018-12-03 DIAGNOSIS — E7849 Other hyperlipidemia: Secondary | ICD-10-CM

## 2018-12-03 DIAGNOSIS — C73 Malignant neoplasm of thyroid gland: Secondary | ICD-10-CM | POA: Diagnosis not present

## 2018-12-03 DIAGNOSIS — I480 Paroxysmal atrial fibrillation: Secondary | ICD-10-CM | POA: Diagnosis not present

## 2018-12-03 DIAGNOSIS — E559 Vitamin D deficiency, unspecified: Secondary | ICD-10-CM | POA: Diagnosis not present

## 2018-12-03 NOTE — Progress Notes (Signed)
Subjective:    Patient ID: [name], female    DOB: 06-24-1946, 73 y.o.   MRN: [id number]  HPI Pt here for follow up and management of chronic medical problems which includes hypertension and hyperlipidemia. She is taking medication regularly.  The patient is doing well overall.  She is complaining with what she thinks may have been a fracture in 1 of her toes but it is improving.  She is due to get a DEXA scan and will get this done at the next visit she will be given an FOBT to return and will get lab work today.  Her vital signs are stable her BMI is 23.41.  She is on thyroid replacement and suppression of thyroid hormone due to thyroid cancer in the past.  She is on flecainide omega-3 fatty acids.  She sees a cardiologist regularly because of a remote history of atrial fib and she also occasionally sees the endocrinologist because of her thyroid condition.  She has a history of osteo-porosis hypertension hypothyroidism mitral valve disorder and a past history of pheochromocytoma and vitamin D deficiency.  Patient is pleasant and doing well and has no major complaints.  She is worried about her husband.  She denies chest pain pressure tightness or shortness of breath.  She denies any trouble with her stomach including swallowing heartburn indigestion nausea vomiting diarrhea blood in the stool or black tarry bowel movements.  Is passing her water without problems.  She sees the cardiologist yearly in June.  She only sees the endocrinologist if needed.    Patient Active Problem List   Diagnosis Date Noted  . Murmur, cardiac 04/24/2018  . 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 Personal history of pheochromocytoma 02/17/2014  . Cold intolerance 02/17/2014  . Vitamin D deficiency 10/21/2013  . 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 Hypothyroidism 10/21/2013  . Thyroid cancer ([place]) 06/18/2013  . High risk medication use 06/18/2013  . Sinus tachycardia 07/06/2012    . Hypokalemia 07/06/2012  . Hypertension 04/10/2010  . Atrial fibrillation ([place]) 04/04/2010  . Mitral valve disorder 03/28/2010  . 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 Artificial Tear Ointment (REFRESH LACRI-LUBE) OINT Apply 1 inch to eye at bedtime. Apply to bottom lid  . aspirin 325 MG EC tablet Take 325 mg by mouth daily.   . Cholecalciferol (VITAMIN D3) 2000 UNITS TABS Take 1 capsule by mouth daily.   . flecainide (TAMBOCOR) 50 MG tablet Take 1 tablet (50 mg total) by mouth 2 (two) times daily.  Marland Kitchen loratadine (CLARITIN) 10 MG tablet Take 10 mg by mouth daily as needed for allergies.   . Multiple Vitamins-Minerals (CENTRUM SILVER) tablet Take 1 tablet by mouth every morning.   . Multiple Vitamins-Minerals (PRESERVISION/LUTEIN PO) Take 1 capsule by mouth daily.  . Omega-3 Fatty Acids (FISH OIL) 1200 MG CAPS Take 1 capsule by mouth daily.  Marland Kitchen SYNTHROID 150 MCG tablet TAKE 1 TABLET (150 MCG TOTAL) BY MOUTH AS DIRECTED.   No facility-administered encounter medications on file as of 12/03/2018.       Review of Systems  Constitutional: Negative.   HENT: Negative.   Eyes: Negative.   Respiratory: Negative.   Cardiovascular: Negative.   Gastrointestinal: Negative.   Endocrine: Negative.   Genitourinary: Negative.   Musculoskeletal: Negative.   Skin: Negative.   Allergic/Immunologic: Negative.   Neurological: Negative.   Hematological: Negative.   Psychiatric/Behavioral: Negative.  Objective:   Physical Exam Vitals signs and nursing note reviewed.  Constitutional:      General: She is not in acute distress.    Appearance: Normal appearance. She is well-developed and normal weight.     Comments: Pleasant and alert  HENT:     Head: Normocephalic and atraumatic.     Right Ear: Tympanic membrane, ear canal and external ear normal. There is no impacted cerumen.     Left Ear: Tympanic membrane, ear  canal and external ear normal. There is no impacted cerumen.     Nose: Nose normal. No congestion.     Mouth/Throat:     Mouth: Mucous membranes are dry.     Pharynx: Oropharynx is clear. No oropharyngeal exudate or posterior oropharyngeal erythema.  Eyes:     General: No scleral icterus.       Right eye: No discharge.        Left eye: No discharge.     Extraocular Movements: Extraocular movements intact.     Conjunctiva/sclera: Conjunctivae normal.     Pupils: Pupils are equal, round, and reactive to light.  Neck:     Musculoskeletal: Normal range of motion and neck supple.     Thyroid: No thyromegaly.     Vascular: No JVD.     Comments: No thyromegaly or thyroid masses adenopathy or bruits Cardiovascular:     Rate and Rhythm: Normal rate and regular rhythm.     Pulses: Normal pulses.     Heart sounds: Normal heart sounds. No murmur.  Pulmonary:     Effort: Pulmonary effort is normal.     Breath sounds: Normal breath sounds. No wheezing or rales.     Comments: Clear anteriorly and posteriorly Abdominal:     General: Abdomen is flat. Bowel sounds are normal. There is no distension.     Palpations: Abdomen is soft. There is no mass.     Tenderness: There is no abdominal tenderness.     Comments: No masses tenderness organ enlargement or bruits  Musculoskeletal: Normal range of motion.        General: No tenderness.     Right lower leg: No edema.     Left lower leg: No edema.  Lymphadenopathy:     Cervical: No cervical adenopathy.  Skin:    General: Skin is warm and dry.     Findings: No rash.  Neurological:     General: No focal deficit present.     Mental Status: She is alert and oriented to person, place, and time. Mental status is at baseline.     Cranial Nerves: No cranial nerve deficit.     Deep Tendon Reflexes: Reflexes are normal and symmetric. Reflexes normal.  Psychiatric:        Mood and Affect: Mood normal.        Behavior: Behavior normal.        Thought  Content: Thought content normal.        Judgment: Judgment normal.    BP (!) 92/54 (BP Location: Left Arm)   Pulse 76   Temp (!) 96.7 F (35.9 C) (Oral)   Ht _0  (1.676 m)   Wt 147 lb (66.7 kg)   BMI 23.73 kg/m        Assessment & Plan:  1. Hypertension -Continue with current treatment - BMP8+EGFR - CBC with Differential/Platelet - Hepatic function panel  2. Other hyperlipidemia -Continue with aggressive therapeutic lifestyle changes and omega-3 fatty acids as currently doing -  CBC with Differential/Platelet - Lipid panel  3. Thyroid cancer ([place]) -Continue with current treatment pending results of lab work - CBC with Differential/Platelet - Thyroid Panel With TSH  4. Vitamin D deficiency -Continue with calcium and vitamin D replacement and all efforts at fall prevention - CBC with Differential/Platelet - VITAMIN D 25 Hydroxy (Vit-D Deficiency, Fractures)  5. Paroxysmal atrial fibrillation ([facility]) -Follow-up with cardiology for SVT and monitoring of flecainide - CBC with Differential/Platelet  6. History of pheochromocytoma - Cortisol  Patient Instructions                       Medicare Annual Wellness Visit  [facility] and the medical providers at [facility] strive to bring you the best medical care.  In doing so we not only want to address your current medical conditions and concerns but also to detect new conditions early and prevent illness, disease and health-related problems.    Medicare offers a yearly Wellness Visit which allows our clinical staff to assess your need for preventative services including immunizations, lifestyle education, counseling to decrease risk of preventable diseases and screening for fall risk and other medical concerns.    This visit is provided free of charge (no copay) for all Medicare recipients. The clinical pharmacists at [facility] have begun to conduct these Wellness Visits which  will also include a thorough review of all your medications.    As you primary medical provider recommend that you make an appointment for your Annual Wellness Visit if you have not done so already this year.  You may set up this appointment before you leave today or you may call back ([phone]) and schedule an appointment.  Please make sure when you call that you mention that you are scheduling your Annual Wellness Visit with the clinical pharmacist so that the appointment may be made for the proper length of time.    Continue current medications. Continue good therapeutic lifestyle changes which include good diet and exercise. Fall precautions discussed with patient. If an FOBT was given today- please return it to our front desk. If you are over 78 years old - you may need Prevnar 41 or the adult Pneumonia vaccine.  **Flu shots are available--- please call and schedule a FLU-CLINIC appointment**  After your visit with [place] today you will receive a survey in the mail or online from [employer] regarding your care with [place]. Please take a moment to fill this out. Your feedback is very important to [place] as you can help [place] better understand your patient needs as well as improve your experience and satisfaction. WE CARE ABOUT YOU!!!   Continue with current medicines pending results of lab work Continue to follow-up with cardiology as planned Plan to get your DEXA scan at the next visit Return the FOBT We will call with lab work results as soon as those results become available  Arrie Senate MD

## 2018-12-03 NOTE — Patient Instructions (Addendum)
Medicare Annual Wellness Visit  [facility] and the medical providers at [facility] strive to bring you the best medical care.  In doing so we not only want to address your current medical conditions and concerns but also to detect new conditions early and prevent illness, disease and health-related problems.    Medicare offers a yearly Wellness Visit which allows our clinical staff to assess your need for preventative services including immunizations, lifestyle education, counseling to decrease risk of preventable diseases and screening for fall risk and other medical concerns.    This visit is provided free of charge (no copay) for all Medicare recipients. The clinical pharmacists at [facility] have begun to conduct these Wellness Visits which will also include a thorough review of all your medications.    As you primary medical provider recommend that you make an appointment for your Annual Wellness Visit if you have not done so already this year.  You may set up this appointment before you leave today or you may call back ([phone]) and schedule an appointment.  Please make sure when you call that you mention that you are scheduling your Annual Wellness Visit with the clinical pharmacist so that the appointment may be made for the proper length of time.    Continue current medications. Continue good therapeutic lifestyle changes which include good diet and exercise. Fall precautions discussed with patient. If an FOBT was given today- please return it to our front desk. If you are over 73 years old - you may need Prevnar 33 or the adult Pneumonia vaccine.  **Flu shots are available--- please call and schedule a FLU-CLINIC appointment**  After your visit with [place] today you will receive a survey in the mail or online from [employer] regarding your care with [place]. Please take a moment to fill this out. Your feedback is very  important to [place] as you can help [place] better understand your patient needs as well as improve your experience and satisfaction. WE CARE ABOUT YOU!!!   Continue with current medicines pending results of lab work Continue to follow-up with cardiology as planned Plan to get your DEXA scan at the next visit Return the FOBT We will call with lab work results as soon as those results become available

## 2018-12-04 LAB — CBC WITH DIFFERENTIAL/PLATELET
BASOS ABS: 0 10*3/uL (ref 0.0–0.2)
Basos: 1 %
EOS (ABSOLUTE): 0.4 10*3/uL (ref 0.0–0.4)
Eos: 6 %
HEMOGLOBIN: 13.3 g/dL (ref 11.1–15.9)
Hematocrit: 39 % (ref 34.0–46.6)
Immature Grans (Abs): 0 10*3/uL (ref 0.0–0.1)
Immature Granulocytes: 0 %
Lymphocytes Absolute: 1.6 10*3/uL (ref 0.7–3.1)
Lymphs: 27 %
MCH: 30.5 pg (ref 26.6–33.0)
MCHC: 34.1 g/dL (ref 31.5–35.7)
MCV: 89 fL (ref 79–97)
MONOS ABS: 0.6 10*3/uL (ref 0.1–0.9)
Monocytes: 11 %
NEUTROS ABS: 3.3 10*3/uL (ref 1.4–7.0)
Neutrophils: 55 %
Platelets: 172 10*3/uL (ref 150–450)
RBC: 4.36 x10E6/uL (ref 3.77–5.28)
RDW: 12.1 % (ref 11.7–15.4)
WBC: 5.9 10*3/uL (ref 3.4–10.8)

## 2018-12-04 LAB — THYROID PANEL WITH TSH
Free Thyroxine Index: 3.4 (ref 1.2–4.9)
T3 UPTAKE RATIO: 32 % (ref 24–39)
T4 TOTAL: 10.7 ug/dL (ref 4.5–12.0)
TSH: 0.011 u[IU]/mL — AB (ref 0.450–4.500)

## 2018-12-04 LAB — BMP8+EGFR
BUN / CREAT RATIO: 19 (ref 12–28)
BUN: 12 mg/dL (ref 8–27)
CHLORIDE: 100 mmol/L (ref 96–106)
CO2: 25 mmol/L (ref 20–29)
Calcium: 9.7 mg/dL (ref 8.7–10.3)
Creatinine, Ser: 0.63 mg/dL (ref 0.57–1.00)
GFR calc non Af Amer: 90 mL/min/{1.73_m2} (ref >59)
GFR, EST AFRICAN AMERICAN: 104 mL/min/{1.73_m2} (ref >59)
GLUCOSE: 87 mg/dL (ref 65–99)
POTASSIUM: 4.8 mmol/L (ref 3.5–5.2)
SODIUM: 139 mmol/L (ref 134–144)

## 2018-12-04 LAB — LIPID PANEL
CHOL/HDL RATIO: 1.7 ratio (ref 0.0–4.4)
CHOLESTEROL TOTAL: 179 mg/dL (ref 100–199)
HDL: 107 mg/dL (ref >39)
LDL CALC: 64 mg/dL (ref 0–99)
TRIGLYCERIDES: 40 mg/dL (ref 0–149)
VLDL Cholesterol Cal: 8 mg/dL (ref 5–40)

## 2018-12-04 LAB — HEPATIC FUNCTION PANEL
ALT: 19 IU/L (ref 0–32)
AST: 24 IU/L (ref 0–40)
Albumin: 4.2 g/dL (ref 3.7–4.7)
Alkaline Phosphatase: 84 IU/L (ref 39–117)
Bilirubin Total: 0.6 mg/dL (ref 0.0–1.2)
Bilirubin, Direct: 0.19 mg/dL (ref 0.00–0.40)
Total Protein: 6.7 g/dL (ref 6.0–8.5)

## 2018-12-04 LAB — VITAMIN D 25 HYDROXY (VIT D DEFICIENCY, FRACTURES): Vit D, 25-Hydroxy: 43.3 ng/mL (ref 30.0–100.0)

## 2018-12-04 LAB — CORTISOL: CORTISOL: 9.8 ug/dL

## 2019-03-23 DIAGNOSIS — H348112 Central retinal vein occlusion, right eye, stable: Secondary | ICD-10-CM | POA: Diagnosis not present

## 2019-03-23 DIAGNOSIS — H43391 Other vitreous opacities, right eye: Secondary | ICD-10-CM | POA: Diagnosis not present

## 2019-03-23 DIAGNOSIS — H35043 Retinal micro-aneurysms, unspecified, bilateral: Secondary | ICD-10-CM | POA: Diagnosis not present

## 2019-03-23 DIAGNOSIS — Z8619 Personal history of other infectious and parasitic diseases: Secondary | ICD-10-CM | POA: Diagnosis not present

## 2019-04-10 ENCOUNTER — Telehealth: Payer: Self-pay | Admitting: Family Medicine

## 2019-04-10 ENCOUNTER — Telehealth: Payer: Self-pay | Admitting: *Deleted

## 2019-04-10 NOTE — Telephone Encounter (Signed)
LMOM for patient to call the office to discuss converting 04/15/19 visit to a virtual.

## 2019-04-13 ENCOUNTER — Ambulatory Visit: Payer: PPO | Admitting: Family Medicine

## 2019-04-13 NOTE — Telephone Encounter (Signed)
appt switched to DR Dettinger / pt aware

## 2019-04-15 ENCOUNTER — Ambulatory Visit: Payer: PPO | Admitting: Cardiology

## 2019-04-17 ENCOUNTER — Other Ambulatory Visit: Payer: Self-pay

## 2019-04-20 ENCOUNTER — Ambulatory Visit (INDEPENDENT_AMBULATORY_CARE_PROVIDER_SITE_OTHER): Payer: PPO | Admitting: Family Medicine

## 2019-04-20 ENCOUNTER — Other Ambulatory Visit: Payer: Self-pay

## 2019-04-20 ENCOUNTER — Encounter: Payer: Self-pay | Admitting: Family Medicine

## 2019-04-20 VITALS — BP 127/66 | HR 77 | Temp 96.8°F | Ht 66.0 in | Wt 147.0 lb

## 2019-04-20 DIAGNOSIS — E039 Hypothyroidism, unspecified: Secondary | ICD-10-CM | POA: Diagnosis not present

## 2019-04-20 DIAGNOSIS — M810 Age-related osteoporosis without current pathological fracture: Secondary | ICD-10-CM

## 2019-04-20 DIAGNOSIS — C73 Malignant neoplasm of thyroid gland: Secondary | ICD-10-CM

## 2019-04-20 DIAGNOSIS — R109 Unspecified abdominal pain: Secondary | ICD-10-CM

## 2019-04-20 DIAGNOSIS — I480 Paroxysmal atrial fibrillation: Secondary | ICD-10-CM

## 2019-04-20 MED ORDER — FLECAINIDE ACETATE 50 MG PO TABS: 50.0000 mg | ORAL_TABLET | Freq: Two times a day (BID) | ORAL | 3 refills | Status: DC

## 2019-04-20 MED ORDER — NAPROXEN 500 MG PO TBEC: 500.0000 mg | DELAYED_RELEASE_TABLET | Freq: Two times a day (BID) | ORAL | 1 refills | Status: DC

## 2019-04-20 MED ORDER — SYNTHROID 150 MCG PO TABS: ORAL_TABLET | ORAL | 3 refills | Status: DC

## 2019-04-20 NOTE — Progress Notes (Signed)
BP 127/66   Pulse 77   Temp (!) 96.8 F (36 C) (Oral)   Ht 5\' 6"  (1.676 m)   Wt 147 lb (66.7 kg)   BMI 23.73 kg/m    Subjective:   Patient ID: [name], female    DOB: 1946-07-10, 73 y.o.   MRN: [id number]  HPI: [name] is a 73 y.o. female presenting on 04/20/2019 for Establish Care ([place]), Medical Management of Chronic Issues, and Abdominal Pain (x 4 weeks on and off)   HPI Hypothyroidism recheck Patient is coming in for thyroid recheck today as well. They deny any issues with hair changes or heat or cold problems or diarrhea or constipation. They deny any chest pain or palpitations. They are currently on levothyroxine 150 micrograms   Patient has A. fib and has a cardiologist that manages this.  She needs a refill of her flecainide today that she takes for that.  She denies any chest pain or palpitations and says that it has been working very well for her.  Patient complains of some abdominal pain that she has been having for 4 weeks on and off.  She says at rest she will not have it and then she will fill it when she twists or turns or moves or bends or lifts things.  She feels the pain on the right upper side of her abdomen near her ribs.  She denies any issues with bowel movements or nausea or vomiting or indigestion or heartburn.  She denies any association with food.  The only association that she finds is when she moves it makes it worse.  Relevant past medical, surgical, family and social history reviewed and updated as indicated. Interim medical history since our last visit reviewed. Allergies and medications reviewed and updated.  Review of Systems  Constitutional: Negative for chills and fever.  Eyes: Negative for visual disturbance.  Respiratory: Negative for chest tightness and shortness of breath.   Cardiovascular: Negative for chest pain and leg swelling.  Gastrointestinal: Positive for abdominal pain. Negative for constipation, diarrhea, nausea and  vomiting.  Genitourinary: Negative for difficulty urinating, dysuria and hematuria.  Musculoskeletal: Negative for back pain and gait problem.  Skin: Negative for rash.  Neurological: Negative for light-headedness and headaches.  Psychiatric/Behavioral: Negative for agitation and behavioral problems.  All other systems reviewed and are negative.   Per HPI unless specifically indicated above   Allergies as of 04/20/2019      Reactions   Penicillins Anaphylaxis   Actonel [risedronate Sodium]    Nausea and worsened heart burn   Ceclor [cefaclor] Hives   Acetaminophen Nausea And Vomiting, Other (See Comments)   Headache   Alendronate Sodium Nausea Only, Other (See Comments)   Heartburn, stomach upset   Celecoxib Other (See Comments)   Stomach upset, heart burn   Codeine Nausea And Vomiting, Other (See Comments)   Headache   Ventolin [kdc:albuterol] Palpitations   Rapid heart beat      Medication List       Accurate as of April 20, 2019  4:06 PM. If you have any questions, ask your nurse or doctor.        aspirin 325 MG EC tablet Take 325 mg by mouth daily.   Centrum Silver tablet Take 1 tablet by mouth every morning.   PRESERVISION/LUTEIN PO Take 1 capsule by mouth daily.   Fish Oil 1200 MG Caps Take 1 capsule by mouth daily.   flecainide 50 MG tablet Commonly known  as: TAMBOCOR Take 1 tablet (50 mg total) by mouth 2 (two) times daily.   loratadine 10 MG tablet Commonly known as: CLARITIN Take 10 mg by mouth daily as needed for allergies.   Refresh Lacri-Lube Oint Apply 1 inch to eye at bedtime. Apply to bottom lid   Synthroid 150 MCG tablet Generic drug: levothyroxine TAKE 1 TABLET (150 MCG TOTAL) BY MOUTH AS DIRECTED.   Vitamin D3 50 MCG (2000 UT) Tabs Take 1 capsule by mouth daily.        Objective:   BP 127/66   Pulse 77   Temp (!) 96.8 F (36 C) (Oral)   Ht 5\' 6"  (1.676 m)   Wt 147 lb (66.7 kg)   BMI 23.73 kg/m   Wt Readings from Last 3  Encounters:  04/20/19 147 lb (66.7 kg)  12/03/18 147 lb (66.7 kg)  07/16/18 145 lb (65.8 kg)    Physical Exam Vitals signs and nursing note reviewed.  Constitutional:      General: She is not in acute distress.    Appearance: She is well-developed. She is not diaphoretic.  Eyes:     Conjunctiva/sclera: Conjunctivae normal.  Cardiovascular:     Rate and Rhythm: Normal rate and regular rhythm.     Heart sounds: Normal heart sounds. No murmur.  Pulmonary:     Effort: Pulmonary effort is normal. No respiratory distress.     Breath sounds: Normal breath sounds. No wheezing.  Abdominal:     General: Abdomen is flat. Bowel sounds are normal. There is no distension.     Tenderness: There is abdominal tenderness (Right upper quadrant abdominal tenderness, negative Murphy sign). There is no right CVA tenderness, left CVA tenderness, guarding or rebound.     Hernia: No hernia is present.  Musculoskeletal: Normal range of motion.        General: No tenderness.  Skin:    General: Skin is warm and dry.     Findings: No rash.  Neurological:     Mental Status: She is alert and oriented to person, place, and time.     Coordination: Coordination normal.  Psychiatric:        Behavior: Behavior normal.       Assessment & Plan:   Problem List Items Addressed This Visit      Cardiovascular and Mediastinum   Atrial fibrillation (HCC)   Relevant Medications   flecainide (TAMBOCOR) 50 MG tablet     Endocrine   Thyroid cancer (HCC)   Relevant Medications   SYNTHROID 150 MCG tablet   Other Relevant Orders   TSH (Completed)   Hypothyroidism - Primary   Relevant Medications   SYNTHROID 150 MCG tablet     Musculoskeletal and Integument   Osteoporosis, post-menopausal    Other Visit Diagnoses    Abdominal wall pain       Relevant Medications   naproxen (NAPROXEN DR) 500 MG EC tablet      Patient has a history of thyroid cancer and will check thyroid levels, continue current dose  until levels come back.  Patient has postmenopausal osteoporosis but doing well without any fractures and due to her DEXA scan being down we will hold off until next time. . Abdominal pain is likely abdominal wall musculature pain and recommended for her to do stretching and naproxen.  Hold aspirin while she is taking naproxen. Follow up plan: Return in about 4 months (around 08/20/2019), or if symptoms worsen or fail to improve, for thyroid.  Counseling provided for all of the vaccine components No orders of the defined types were placed in this encounter.   Caryl Pina, MD [facility] 04/20/2019, 4:06 PM

## 2019-04-21 LAB — TSH: TSH: 0.011 u[IU]/mL — ABNORMAL LOW (ref 0.450–4.500)

## 2019-04-22 ENCOUNTER — Ambulatory Visit: Payer: PPO | Admitting: Family Medicine

## 2019-04-22 ENCOUNTER — Encounter: Payer: PPO | Admitting: *Deleted

## 2019-04-29 DIAGNOSIS — Z1231 Encounter for screening mammogram for malignant neoplasm of breast: Secondary | ICD-10-CM | POA: Diagnosis not present

## 2019-06-30 NOTE — Progress Notes (Signed)
HPI The patient presents for follow up of atrial fib.   She has had surgery for treatment of  pheochromocytoma.  She has moderate tricuspid regurgitation with no evidence of pulmonary hypertension.   I checked this on echo last in 2019.  Since then she returns for follow up.  She has been doing well.  She is not been as active as she still goes to the pet store.  The patient denies any new symptoms such as chest discomfort, neck or arm discomfort. There has been no new shortness of breath, PND or orthopnea. There have been no reported palpitations, presyncope or syncope.   Allergies  Allergen Reactions  . Penicillins Anaphylaxis  . Actonel [Risedronate Sodium]     Nausea and worsened heart burn  . Ceclor [Cefaclor] Hives  . Acetaminophen Nausea And Vomiting and Other (See Comments)    Headache  . Alendronate Sodium Nausea Only and Other (See Comments)    Heartburn, stomach upset  . Celecoxib Other (See Comments)    Stomach upset, heart burn  . Codeine Nausea And Vomiting and Other (See Comments)    Headache  . Ventolin [Kdc:Albuterol] Palpitations    Rapid heart beat    Current Outpatient Medications  Medication Sig Dispense Refill  . Artificial Tear Ointment (REFRESH LACRI-LUBE) OINT Apply 1 inch to eye at bedtime. Apply to bottom lid    . aspirin 325 MG EC tablet Take 325 mg by mouth daily.     . Cholecalciferol (VITAMIN D3) 2000 UNITS TABS Take 1 capsule by mouth daily.     . flecainide (TAMBOCOR) 50 MG tablet Take 1 tablet (50 mg total) by mouth 2 (two) times daily. 180 tablet 3  . loratadine (CLARITIN) 10 MG tablet Take 10 mg by mouth daily as needed for allergies.     . Multiple Vitamins-Minerals (CENTRUM SILVER) tablet Take 1 tablet by mouth every morning.     . Multiple Vitamins-Minerals (PRESERVISION/LUTEIN PO) Take 1 capsule by mouth daily.    . Omega-3 Fatty Acids (FISH OIL) 1200 MG CAPS Take 1 capsule by mouth daily.    Marland Kitchen SYNTHROID 150 MCG tablet TAKE 1 TABLET  (150 MCG TOTAL) BY MOUTH AS DIRECTED. 90 tablet 3   No current facility-administered medications for this visit.     Past Medical History:  Diagnosis Date  . Atrial fibrillation (HCC)    PT ON FLECANIDE -DID NOT WANT TO BE ON BLOOD THINNERS OTHER THAN DAILY ASA  . Blurry vision, right eye    HX OF RT EYE VISION DISTURBANCE 01/2012 -PT SEEN BY DR. RANKIN--ELEVATED B/P  THOUGHT TO BE RELATED TO [place] SPOTTED FEVER THOUGHT TO BE CAUSE OF VISION PROBLEM-HAD LASER OF BOTH EYES (SLIGHT DAMAGE TO LEFT  EYE).    . Cataract    BILATERAL  . GERD (gastroesophageal reflux disease)   . Headache(784.0)    PAST HX MIGRAINES  . Hearing loss of right ear   . Hypertension    B/P HAS BEEN UP AND DOWN BUT MOSTLY DOWN WHILE ON METOPROLOL -WAS TAKEN OFF METOROLOL IN NOV 2012 BY CARDIOLOGIST AND THEN IN MARCH 2013 PT'S B/P BECAME REALLY ELEVATED-SHE RESTARTED METOPROLOL  AND WAS GIVEN BENICAR.  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 Hypothyroidism   . Mitral valve prolapse    per pt report, not listed on echo  . MR (mitral regurgitation)    none on echo in 2013  . Osteoarthritis  LITTLE FINGER LEFT HAND  . Palpitations JUNE 2012   piror to diagnosis of SVT  -NO PROBLMS WITH REOCCURANCE  . Pheochromocytoma   . PONV (postoperative nausea and vomiting)    HX OF N&V AFTER SURGERIES-EXCEPT NO PROBLEM AFTER WRIST SURGERY IN 2012-AT [facility]  . Thyroid cancer [facility])    age 73; s/p resection with subsequent hypothyroidism. 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 TMJ locking    PT STATES IF MOUTH OPEN EXTENDED TIME-LIKE AT DENTIST--HER JAWS LOCK  . TR (tricuspid regurgitation)   . Vegetarian diet    DOES EAT EGGS AND DAIRY--NO MEATS OR FISH    Past Surgical History:  Procedure Laterality Date  . ADRENALECTOMY  07/03/2012   Procedure: ADRENALECTOMY;  Surgeon: Dutch Gray, MD;  Location: [facility];  Service: Urology;  Laterality: Right;  . APPENDECTOMY     . Lung biopsies    . THYROIDECTOMY    . WRIST SURGERY      ROS:   As stated in the HPI and negative for all other systems.  PHYSICAL EXAM BP 110/62   Pulse 76   Ht 5\' 6"  (1.676 m)   Wt 148 lb (67.1 kg)   BMI 23.89 kg/m   GENERAL:  Well appearing NECK:  No jugular venous distention, waveform within normal limits, carotid upstroke brisk and symmetric, no bruits, no thyromegaly LUNGS:  Clear to auscultation bilaterally CHEST:  Unremarkable HEART:  PMI not displaced or sustained,S1 and S2 within normal limits, no S3, no S4, no clicks, no rubs, no murmurs ABD:  Flat, positive bowel sounds normal in frequency in pitch, no bruits, no rebound, no guarding, no midline pulsatile mass, no hepatomegaly, no splenomegaly EXT:  2 plus pulses throughout, no edema, no cyanosis no clubbing, varicose veins    EKG: Sinus rhythm, rate 76, LBBB, new since previous EKG. 07/01/2019   ASSESSMENT AND PLAN   HYPERTENSION -  The blood pressure is at target.  No change in therapy.   ATRIAL FIBRILLATION -  She has had no symptomatic paroxysms of this.  No change in therapy.  Of note she has been very sensitive to calcium channel blockers and beta-blockers in the past if these are needed in the future that would need to be done cautiously.   TRICUSPID REGURGITATION - I would not suspect this to be worse clinically.  I will follow this clinically and consider echocardiography in 12 months or so.   MITRAL REGURGITATION -  As above.  LBBB - This is chronic.  No change in therapy.

## 2019-07-01 ENCOUNTER — Encounter: Payer: Self-pay | Admitting: Cardiology

## 2019-07-01 ENCOUNTER — Ambulatory Visit (INDEPENDENT_AMBULATORY_CARE_PROVIDER_SITE_OTHER): Payer: PPO | Admitting: Cardiology

## 2019-07-01 ENCOUNTER — Other Ambulatory Visit: Payer: Self-pay

## 2019-07-01 VITALS — BP 110/62 | HR 76 | Ht 66.0 in | Wt 148.0 lb

## 2019-07-01 DIAGNOSIS — I10 Essential (primary) hypertension: Secondary | ICD-10-CM

## 2019-07-01 DIAGNOSIS — I361 Nonrheumatic tricuspid (valve) insufficiency: Secondary | ICD-10-CM

## 2019-07-01 DIAGNOSIS — I480 Paroxysmal atrial fibrillation: Secondary | ICD-10-CM | POA: Diagnosis not present

## 2019-07-01 NOTE — Patient Instructions (Signed)
Medication Instructions:  The current medical regimen is effective;  continue present plan and medications.  If you need a refill on your cardiac medications before your next appointment, please call your pharmacy.   Follow-Up: Follow up in 1 year with Dr. Hochrein.  You will receive a letter in the mail 2 months before you are due.  Please call us when you receive this letter to schedule your follow up appointment.  Thank you for choosing [facility]!!     

## 2019-08-19 ENCOUNTER — Other Ambulatory Visit: Payer: Self-pay

## 2019-08-20 ENCOUNTER — Ambulatory Visit (INDEPENDENT_AMBULATORY_CARE_PROVIDER_SITE_OTHER): Payer: PPO | Admitting: Family Medicine

## 2019-08-20 ENCOUNTER — Encounter: Payer: Self-pay | Admitting: Family Medicine

## 2019-08-20 VITALS — BP 112/60 | HR 65 | Temp 96.0°F | Ht 66.0 in | Wt 147.6 lb

## 2019-08-20 DIAGNOSIS — Z23 Encounter for immunization: Secondary | ICD-10-CM | POA: Diagnosis not present

## 2019-08-20 DIAGNOSIS — Z85850 Personal history of malignant neoplasm of thyroid: Secondary | ICD-10-CM

## 2019-08-20 DIAGNOSIS — Z1159 Encounter for screening for other viral diseases: Secondary | ICD-10-CM

## 2019-08-20 DIAGNOSIS — E559 Vitamin D deficiency, unspecified: Secondary | ICD-10-CM

## 2019-08-20 DIAGNOSIS — E039 Hypothyroidism, unspecified: Secondary | ICD-10-CM

## 2019-08-20 NOTE — Progress Notes (Signed)
BP 112/60   Pulse 65   Temp (!) 96 F (35.6 C) (Temporal)   Ht 5\' 6"  (1.676 m)   Wt 147 lb 9.6 oz (67 kg)   SpO2 99%   BMI 23.82 kg/m    Subjective:   Patient ID: [name], female    DOB: Nov 01, 1946, 73 y.o.   MRN: [id number]  HPI: [name] is a 73 y.o. female presenting on 08/20/2019 for Hypothyroidism (4 month follow up)   HPI Hypothyroidism recheck Patient is coming in for thyroid recheck today as well. They deny any issues with hair changes or heat or cold problems or diarrhea or constipation. They deny any chest pain or palpitations. They are currently on levothyroxine 159micrograms. Patient has a history of thyroid cancer from when she was a child at 57 years of age due to some radiation exposure from a procedure.  She has not had any recurrent symptoms x-rays every few years.  She did have spread to her lungs at that time but was treated with radioactive iodine  Patient has a history of vitamin D deficiency and osteoporosis and we will recheck her vitamin D levels today, she denies any falls or fractures  Relevant past medical, surgical, family and social history reviewed and updated as indicated. Interim medical history since our last visit reviewed. Allergies and medications reviewed and updated.  Review of Systems  Constitutional: Negative for chills and fever.  Eyes: Negative for visual disturbance.  Respiratory: Negative for chest tightness and shortness of breath.   Cardiovascular: Negative for chest pain and leg swelling.  Genitourinary: Negative for difficulty urinating and dysuria.  Musculoskeletal: Negative for back pain and gait problem.  Skin: Negative for rash.  Neurological: Negative for light-headedness and headaches.  Psychiatric/Behavioral: Negative for agitation and behavioral problems.  All other systems reviewed and are negative.   Per HPI unless specifically indicated above   Allergies as of 08/20/2019      Reactions   Penicillins Anaphylaxis   Actonel [risedronate Sodium]    Nausea and worsened heart burn   Ceclor [cefaclor] Hives   Acetaminophen Nausea And Vomiting, Other (See Comments)   Headache   Alendronate Sodium Nausea Only, Other (See Comments)   Heartburn, stomach upset   Celecoxib Other (See Comments)   Stomach upset, heart burn   Codeine Nausea And Vomiting, Other (See Comments)   Headache   Ventolin [kdc:albuterol] Palpitations   Rapid heart beat      Medication List       Accurate as of August 20, 2019  9:10 AM. If you have any questions, ask your nurse or doctor.        aspirin 325 MG EC tablet Take 325 mg by mouth daily.   Centrum Silver tablet Take 1 tablet by mouth every morning.   PRESERVISION/LUTEIN PO Take 1 capsule by mouth daily.   Fish Oil 1200 MG Caps Take 1 capsule by mouth daily.   flecainide 50 MG tablet Commonly known as: TAMBOCOR Take 1 tablet (50 mg total) by mouth 2 (two) times daily.   loratadine 10 MG tablet Commonly known as: CLARITIN Take 10 mg by mouth daily as needed for allergies.   Refresh Lacri-Lube Oint Apply 1 inch to eye at bedtime. Apply to bottom lid   Synthroid 150 MCG tablet Generic drug: levothyroxine TAKE 1 TABLET (150 MCG TOTAL) BY MOUTH AS DIRECTED.   Vitamin D3 50 MCG (2000 UT) Tabs Take 1 capsule by mouth daily.  Objective:   BP 112/60   Pulse 65   Temp (!) 96 F (35.6 C) (Temporal)   Ht 5\' 6"  (1.676 m)   Wt 147 lb 9.6 oz (67 kg)   SpO2 99%   BMI 23.82 kg/m   Wt Readings from Last 3 Encounters:  08/20/19 147 lb 9.6 oz (67 kg)  07/01/19 148 lb (67.1 kg)  04/20/19 147 lb (66.7 kg)    Physical Exam Vitals signs and nursing note reviewed.  Constitutional:      General: She is not in acute distress.    Appearance: She is well-developed. She is not diaphoretic.  Eyes:     Conjunctiva/sclera: Conjunctivae normal.     Pupils: Pupils are equal, round, and reactive to light.  Cardiovascular:     Rate  and Rhythm: Normal rate and regular rhythm.     Heart sounds: Normal heart sounds. No murmur.  Pulmonary:     Effort: Pulmonary effort is normal. No respiratory distress.     Breath sounds: Normal breath sounds. No wheezing.  Musculoskeletal: Normal range of motion.        General: No tenderness.  Skin:    General: Skin is warm and dry.     Findings: No rash.  Neurological:     Mental Status: She is alert and oriented to person, place, and time.     Coordination: Coordination normal.  Psychiatric:        Behavior: Behavior normal.       Assessment & Plan:   Problem List Items Addressed This Visit      Endocrine   Hypothyroidism - Primary   Relevant Orders   TSH     Other   History of thyroid cancer   Relevant Orders   TSH   DG Chest 2 View   Vitamin D deficiency   Relevant Orders   VITAMIN D 25 Hydroxy (Vit-D Deficiency, Fractures)    Other Visit Diagnoses    Need for hepatitis C screening test       Relevant Orders   Hepatitis C antibody      We will check blood work, patient has a history of thyroid cancer so we will do chest x-ray because she has had some scarring in her lungs in the past and will check her thyroid levels and vitamin D, do a chest x-ray every few years Follow up plan: Return in about 4 months (around 12/21/2019), or if symptoms worsen or fail to improve, for Thyroid recheck.  Counseling provided for all of the vaccine components Orders Placed This Encounter  Procedures  . TSH  . VITAMIN D 25 Hydroxy (Vit-D Deficiency, Fractures)  . Hepatitis C antibody    Caryl Pina, MD [facility] 08/20/2019, 9:10 AM

## 2019-08-21 LAB — TSH: TSH: <0.005 u[IU]/mL — ABNORMAL LOW (ref 0.450–4.500)

## 2019-08-21 LAB — HEPATITIS C ANTIBODY: Hep C Virus Ab: <0.1 s/co ratio (ref 0.0–0.9)

## 2019-08-21 LAB — VITAMIN D 25 HYDROXY (VIT D DEFICIENCY, FRACTURES): Vit D, 25-Hydroxy: 58.4 ng/mL (ref 30.0–100.0)

## 2019-10-13 DIAGNOSIS — H353131 Nonexudative age-related macular degeneration, bilateral, early dry stage: Secondary | ICD-10-CM | POA: Diagnosis not present

## 2019-10-13 DIAGNOSIS — Z961 Presence of intraocular lens: Secondary | ICD-10-CM | POA: Diagnosis not present

## 2019-10-13 DIAGNOSIS — H31093 Other chorioretinal scars, bilateral: Secondary | ICD-10-CM | POA: Diagnosis not present

## 2019-10-13 DIAGNOSIS — H26493 Other secondary cataract, bilateral: Secondary | ICD-10-CM | POA: Diagnosis not present

## 2019-10-13 DIAGNOSIS — H43811 Vitreous degeneration, right eye: Secondary | ICD-10-CM | POA: Diagnosis not present

## 2019-10-13 DIAGNOSIS — H04123 Dry eye syndrome of bilateral lacrimal glands: Secondary | ICD-10-CM | POA: Diagnosis not present

## 2019-11-11 DIAGNOSIS — H26491 Other secondary cataract, right eye: Secondary | ICD-10-CM | POA: Diagnosis not present

## 2019-12-21 ENCOUNTER — Ambulatory Visit: Payer: PPO | Admitting: Family Medicine

## 2020-01-13 ENCOUNTER — Other Ambulatory Visit: Payer: Self-pay

## 2020-01-14 ENCOUNTER — Encounter: Payer: Self-pay | Admitting: Family Medicine

## 2020-01-14 ENCOUNTER — Ambulatory Visit (INDEPENDENT_AMBULATORY_CARE_PROVIDER_SITE_OTHER): Payer: PPO | Admitting: Family Medicine

## 2020-01-14 VITALS — BP 101/56 | HR 99 | Temp 99.1°F | Ht 66.0 in | Wt 151.0 lb

## 2020-01-14 DIAGNOSIS — E039 Hypothyroidism, unspecified: Secondary | ICD-10-CM | POA: Diagnosis not present

## 2020-01-14 DIAGNOSIS — D3501 Benign neoplasm of right adrenal gland: Secondary | ICD-10-CM

## 2020-01-14 DIAGNOSIS — I480 Paroxysmal atrial fibrillation: Secondary | ICD-10-CM

## 2020-01-14 NOTE — Progress Notes (Signed)
BP (!) 101/56   Pulse 99   Temp 99.1 F (37.3 C)   Ht 5' 6"  (1.676 m)   Wt 151 lb (68.5 kg)   SpO2 97%   BMI 24.37 kg/m    Subjective:   Patient ID: [name], female    DOB: 1946/06/29, 74 y.o.   MRN: [id number]  HPI: [name] is a 74 y.o. female presenting on 01/14/2020 for Medical Management of Chronic Issues and Hypothyroidism   HPI Hypothyroidism recheck Patient is coming in for thyroid recheck today as well. They deny any issues with hair changes or heat or cold problems or diarrhea or constipation. They deny any chest pain or palpitations. They are currently on levothyroxine 136mcrograms   A. fib recheck Patient is currently on flecainide for this.  She is not currently on anticoagulation just aspirin.  Patient denies any bruising or bleeding or chest pain or palpitations, patient sees Dr. HArchie Pattento manage this.  Patient has a history of benign adrenal tumor that has been found and they just monitor with cortisol levels to make sure there is no changes.  We will check this again with this blood work.  Relevant past medical, surgical, family and social history reviewed and updated as indicated. Interim medical history since our last visit reviewed. Allergies and medications reviewed and updated.  Review of Systems  Constitutional: Negative for chills and fever.  HENT: Negative for congestion, ear discharge and ear pain.   Eyes: Negative for redness and visual disturbance.  Respiratory: Negative for chest tightness and shortness of breath.   Cardiovascular: Negative for chest pain, palpitations and leg swelling.  Genitourinary: Negative for difficulty urinating and dysuria.  Musculoskeletal: Negative for back pain and gait problem.  Skin: Negative for rash.  Neurological: Negative for light-headedness and headaches.  Psychiatric/Behavioral: Negative for agitation and behavioral problems.  All other systems reviewed and are negative.   Per HPI  unless specifically indicated above   Allergies as of 01/14/2020      Reactions   Penicillins Anaphylaxis   Actonel [risedronate Sodium]    Nausea and worsened heart burn   Ceclor [cefaclor] Hives   Acetaminophen Nausea And Vomiting, Other (See Comments)   Headache   Alendronate Sodium Nausea Only, Other (See Comments)   Heartburn, stomach upset   Celecoxib Other (See Comments)   Stomach upset, heart burn   Codeine Nausea And Vomiting, Other (See Comments)   Headache   Ventolin [kdc:albuterol] Palpitations   Rapid heart beat      Medication List       Accurate as of January 14, 2020  2:25 PM. If you have any questions, ask your nurse or doctor.        aspirin 325 MG EC tablet Take 325 mg by mouth daily.   carboxymethylcellul-glycerin 0.5-0.9 % ophthalmic solution Commonly known as: REFRESH OPTIVE Place 1 drop into both eyes in the morning and at bedtime.   Centrum Silver tablet Take 1 tablet by mouth every morning.   PRESERVISION/LUTEIN PO Take 1 capsule by mouth daily.   Fish Oil 1200 MG Caps Take 1 capsule by mouth daily.   flecainide 50 MG tablet Commonly known as: TAMBOCOR Take 1 tablet (50 mg total) by mouth 2 (two) times daily.   loratadine 10 MG tablet Commonly known as: CLARITIN Take 10 mg by mouth daily as needed for allergies.   Refresh Lacri-Lube Oint Apply 1 inch to eye at bedtime. Apply to bottom lid   Synthroid  150 MCG tablet Generic drug: levothyroxine TAKE 1 TABLET (150 MCG TOTAL) BY MOUTH AS DIRECTED.   Vitamin D3 50 MCG (2000 UT) Tabs Take 1 capsule by mouth daily.        Objective:   BP (!) 101/56   Pulse 99   Temp 99.1 F (37.3 C)   Ht 5' 6"  (1.676 m)   Wt 151 lb (68.5 kg)   SpO2 97%   BMI 24.37 kg/m   Wt Readings from Last 3 Encounters:  01/14/20 151 lb (68.5 kg)  08/20/19 147 lb 9.6 oz (67 kg)  07/01/19 148 lb (67.1 kg)    Physical Exam Vitals and nursing note reviewed.  Constitutional:      General: She is not in  acute distress.    Appearance: She is well-developed. She is not diaphoretic.  Eyes:     Conjunctiva/sclera: Conjunctivae normal.  Cardiovascular:     Rate and Rhythm: Normal rate and regular rhythm.     Heart sounds: Normal heart sounds. No murmur.  Pulmonary:     Effort: Pulmonary effort is normal. No respiratory distress.     Breath sounds: Normal breath sounds. No wheezing.  Musculoskeletal:        General: No tenderness. Normal range of motion.     Cervical back: Neck supple. No rigidity or tenderness.  Skin:    General: Skin is warm and dry.     Findings: No rash.  Neurological:     Mental Status: She is alert and oriented to person, place, and time.     Coordination: Coordination normal.  Psychiatric:        Behavior: Behavior normal.       Assessment & Plan:   Problem List Items Addressed This Visit      Cardiovascular and Mediastinum   Atrial fibrillation ([place])   Relevant Orders   CBC with Differential/Platelet   CMP14+EGFR   Lipid panel     Endocrine   Benign tumor of adrenal gland   Relevant Orders   Cortisol-am, blood   Hypothyroidism - Primary   Relevant Orders   CMP14+EGFR   Lipid panel   TSH      Will check thyroid levels and see where she is at, recheck in 6 months Follow up plan: Return in about 6 months (around 07/16/2020), or if symptoms worsen or fail to improve, for Recheck thyroid.  Counseling provided for all of the vaccine components Orders Placed This Encounter  Procedures  . CBC with Differential/Platelet  . CMP14+EGFR  . Cortisol-am, blood  . Lipid panel  . TSH    Caryl Pina, MD [facility] 01/14/2020, 2:25 PM

## 2020-01-20 ENCOUNTER — Telehealth: Payer: Self-pay | Admitting: Family Medicine

## 2020-01-20 NOTE — Chronic Care Management (AMB) (Signed)
  Chronic Care Management   Note  01/20/2020 Name: [name] MRN: [id number] DOB: [birth year]  [name] is a 74 y.o. year old female who is a primary care patient of Dettinger, Fransisca Kaufmann, MD. I reached out to [name] by phone today in response to a referral sent by [name]'s health plan.     Ms. Leipold was given information about Chronic Care Management services today including:  1. CCM service includes personalized support from designated clinical staff supervised by her physician, including individualized plan of care and coordination with other care providers 2. 24/7 contact phone numbers for assistance for urgent and routine care needs. 3. Service will only be billed when office clinical staff spend 20 minutes or more in a month to coordinate care. 4. Only one practitioner may furnish and bill the service in a calendar month. 5. The patient may stop CCM services at any time (effective at the end of the month) by phone call to the office staff. 6. The patient will be responsible for cost sharing (co-pay) of up to 20% of the service fee (after annual deductible is met).  Patient agreed to services and verbal consent obtained.   Follow up plan: Telephone appointment with care management team member scheduled for: 06/03/2020.  [place], [place] [address] Direct Dial: [phone] [email or web address] Website: [email or web address]

## 2020-01-28 ENCOUNTER — Other Ambulatory Visit: Payer: Self-pay

## 2020-01-28 ENCOUNTER — Other Ambulatory Visit: Payer: PPO

## 2020-01-28 DIAGNOSIS — D3501 Benign neoplasm of right adrenal gland: Secondary | ICD-10-CM | POA: Diagnosis not present

## 2020-01-28 DIAGNOSIS — E039 Hypothyroidism, unspecified: Secondary | ICD-10-CM

## 2020-01-28 DIAGNOSIS — I480 Paroxysmal atrial fibrillation: Secondary | ICD-10-CM

## 2020-01-29 LAB — CBC WITH DIFFERENTIAL/PLATELET
Basophils Absolute: 0 10*3/uL (ref 0.0–0.2)
Basos: 0 %
EOS (ABSOLUTE): 0.6 10*3/uL — ABNORMAL HIGH (ref 0.0–0.4)
Eos: 9 %
Hematocrit: 36.6 % (ref 34.0–46.6)
Hemoglobin: 12.4 g/dL (ref 11.1–15.9)
Immature Grans (Abs): 0 10*3/uL (ref 0.0–0.1)
Immature Granulocytes: 1 %
Lymphocytes Absolute: 1.6 10*3/uL (ref 0.7–3.1)
Lymphs: 24 %
MCH: 29.8 pg (ref 26.6–33.0)
MCHC: 33.9 g/dL (ref 31.5–35.7)
MCV: 88 fL (ref 79–97)
Monocytes Absolute: 0.7 10*3/uL (ref 0.1–0.9)
Monocytes: 10 %
Neutrophils Absolute: 3.7 10*3/uL (ref 1.4–7.0)
Neutrophils: 56 %
Platelets: 236 10*3/uL (ref 150–450)
RBC: 4.16 x10E6/uL (ref 3.77–5.28)
RDW: 11.8 % (ref 11.7–15.4)
WBC: 6.7 10*3/uL (ref 3.4–10.8)

## 2020-01-29 LAB — CMP14+EGFR
ALT: 13 IU/L (ref 0–32)
AST: 20 IU/L (ref 0–40)
Albumin/Globulin Ratio: 1.2 (ref 1.2–2.2)
Albumin: 3.6 g/dL — ABNORMAL LOW (ref 3.7–4.7)
Alkaline Phosphatase: 81 IU/L (ref 39–117)
BUN/Creatinine Ratio: 15 (ref 12–28)
BUN: 9 mg/dL (ref 8–27)
Bilirubin Total: 0.4 mg/dL (ref 0.0–1.2)
CO2: 28 mmol/L (ref 20–29)
Calcium: 9 mg/dL (ref 8.7–10.3)
Chloride: 100 mmol/L (ref 96–106)
Creatinine, Ser: 0.62 mg/dL (ref 0.57–1.00)
GFR calc Af Amer: 103 mL/min/{1.73_m2} (ref >59)
GFR calc non Af Amer: 90 mL/min/{1.73_m2} (ref >59)
Globulin, Total: 3.1 g/dL (ref 1.5–4.5)
Glucose: 93 mg/dL (ref 65–99)
Potassium: 4.4 mmol/L (ref 3.5–5.2)
Sodium: 138 mmol/L (ref 134–144)
Total Protein: 6.7 g/dL (ref 6.0–8.5)

## 2020-01-29 LAB — CORTISOL-AM, BLOOD: Cortisol - AM: 17.8 ug/dL (ref 6.2–19.4)

## 2020-01-29 LAB — LIPID PANEL
Chol/HDL Ratio: 2.2 ratio (ref 0.0–4.4)
Cholesterol, Total: 144 mg/dL (ref 100–199)
HDL: 66 mg/dL (ref >39)
LDL Chol Calc (NIH): 67 mg/dL (ref 0–99)
Triglycerides: 50 mg/dL (ref 0–149)
VLDL Cholesterol Cal: 11 mg/dL (ref 5–40)

## 2020-01-29 LAB — TSH: TSH: 0.007 u[IU]/mL — ABNORMAL LOW (ref 0.450–4.500)

## 2020-02-03 ENCOUNTER — Ambulatory Visit (INDEPENDENT_AMBULATORY_CARE_PROVIDER_SITE_OTHER): Payer: PPO

## 2020-02-03 ENCOUNTER — Telehealth: Payer: Self-pay

## 2020-02-03 ENCOUNTER — Other Ambulatory Visit: Payer: Self-pay

## 2020-02-03 DIAGNOSIS — Z0000 Encounter for general adult medical examination without abnormal findings: Secondary | ICD-10-CM

## 2020-02-03 NOTE — Telephone Encounter (Signed)
Pt wants to stay on current dose of Levothyroxine due to cost. She had paid $90 for the prescription she currently has of 150 mcg. She wants to finish it out. Pt would like to start on the 177mcg in May.

## 2020-02-03 NOTE — Telephone Encounter (Signed)
Okay I am fine with that, finish the current course out and then start the 137 mcg in May

## 2020-02-03 NOTE — Progress Notes (Signed)
MEDICARE ANNUAL WELLNESS VISIT  02/03/2020  Telephone Visit Disclaimer This Medicare AWV was conducted by telephone due to national recommendations for restrictions regarding the COVID-19 Pandemic (e.g. social distancing).  I verified, using two identifiers, that I am speaking with [name] or their authorized healthcare agent. I discussed the limitations, risks, security, and privacy concerns of performing an evaluation and management service by telephone and the potential availability of an in-person appointment in the future. The patient expressed understanding and agreed to proceed.   Subjective:  [name] is a 74 y.o. female patient of Dettinger, Fransisca Kaufmann, MD who had a Medicare Annual Wellness Visit today via telephone. Saina is Retired and lives with their spouse. she has 1 child. she reports that she is socially active and does interact with friends/family regularly. she is minimally physically active and enjoys.  Patient Care Team: Dettinger, Fransisca Kaufmann, MD as PCP - General (Family Medicine) Minus Breeding, MD (Cardiology) Zadie Rhine Clent Demark, MD (Ophthalmology) Clent Jacks, MD (Ophthalmology) Ilean China, RN as Registered Nurse  Advanced Directives 02/03/2020 07/04/2012 06/27/2012  Does Patient Have a Medical Advance Directive? No Patient does not have advance directive;Patient would not like information Patient does not have advance directive;Patient would not like information  Would patient like information on creating a medical advance directive? No - Patient declined [facility] Utilization Over the Past 12 Months: # of hospitalizations or ER visits: 0 # of surgeries: 0  Review of Systems    Patient reports that her overall health is unchanged compared to last year.  Patient Reported Readings  Does not take vital signs at home  Pain Assessment Pain : No/denies pain     Current Medications & Allergies (verified) Allergies as of 02/03/2020     Reactions   Penicillins Anaphylaxis   Actonel [risedronate Sodium]    Nausea and worsened heart burn   Ceclor [cefaclor] Hives   Acetaminophen Nausea And Vomiting, Other (See Comments)   Headache   Alendronate Sodium Nausea Only, Other (See Comments)   Heartburn, stomach upset   Celecoxib Other (See Comments)   Stomach upset, heart burn   Codeine Nausea And Vomiting, Other (See Comments)   Headache   Ventolin [kdc:albuterol] Palpitations   Rapid heart beat      Medication List       Accurate as of February 03, 2020 10:30 AM. If you have any questions, ask your nurse or doctor.        aspirin 325 MG EC tablet Take 325 mg by mouth daily.   carboxymethylcellul-glycerin 0.5-0.9 % ophthalmic solution Commonly known as: REFRESH OPTIVE Place 1 drop into both eyes in the morning and at bedtime.   Centrum Silver tablet Take 1 tablet by mouth every morning.   PRESERVISION/LUTEIN PO Take 1 capsule by mouth daily.   Fish Oil 1200 MG Caps Take 1 capsule by mouth daily.   flecainide 50 MG tablet Commonly known as: TAMBOCOR Take 1 tablet (50 mg total) by mouth 2 (two) times daily.   loratadine 10 MG tablet Commonly known as: CLARITIN Take 10 mg by mouth daily as needed for allergies.   Refresh Lacri-Lube Oint Apply 1 inch to eye at bedtime. Apply to bottom lid   Synthroid 150 MCG tablet Generic drug: levothyroxine TAKE 1 TABLET (150 MCG TOTAL) BY MOUTH AS DIRECTED.   Vitamin D3 50 MCG (2000 UT) Tabs Take 1 capsule by mouth daily.  History (reviewed): Past Medical History:  Diagnosis Date  . Atrial fibrillation (HCC)    PT ON FLECANIDE -DID NOT WANT TO BE ON BLOOD THINNERS OTHER THAN DAILY ASA  . Blurry vision, right eye    HX OF RT EYE VISION DISTURBANCE 01/2012 -PT SEEN BY DR. RANKIN--ELEVATED B/P  THOUGHT TO BE RELATED TO [place] SPOTTED FEVER THOUGHT TO BE CAUSE OF VISION PROBLEM-HAD LASER OF BOTH EYES (SLIGHT DAMAGE TO LEFT  EYE).    . Cataract     BILATERAL  . GERD (gastroesophageal reflux disease)   . Headache(784.0)    PAST HX MIGRAINES  . Hearing loss of right ear   . Hypertension    B/P HAS BEEN UP AND DOWN BUT MOSTLY DOWN WHILE ON METOPROLOL -WAS TAKEN OFF METOROLOL IN NOV 2012 BY CARDIOLOGIST AND THEN IN MARCH 2013 PT'S B/P BECAME REALLY ELEVATED-SHE RESTARTED METOPROLOL  AND WAS GIVEN BENICAR.  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 Hypothyroidism   . Mitral valve prolapse    per pt report, not listed on echo  . MR (mitral regurgitation)    none on echo in 2013  . Osteoarthritis    LITTLE FINGER LEFT HAND  . Palpitations JUNE 2012   piror to diagnosis of SVT  -NO PROBLMS WITH REOCCURANCE  . Pheochromocytoma   . PONV (postoperative nausea and vomiting)    HX OF N&V AFTER SURGERIES-EXCEPT NO PROBLEM AFTER WRIST SURGERY IN 2012-AT [facility]  . Thyroid cancer [facility])    age 49; s/p resection with subsequent hypothyroidism. 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 TMJ locking    PT STATES IF MOUTH OPEN EXTENDED TIME-LIKE AT DENTIST--HER JAWS LOCK  . TR (tricuspid regurgitation)   . Vegetarian diet    DOES EAT EGGS AND DAIRY--NO MEATS OR FISH   Past Surgical History:  Procedure Laterality Date  . ADRENALECTOMY  07/03/2012   Procedure: ADRENALECTOMY;  Surgeon: Dutch Gray, MD;  Location: [facility];  Service: Urology;  Laterality: Right;  . APPENDECTOMY    . Lung biopsies    . THYROIDECTOMY    . WRIST SURGERY     Family History  Problem Relation Age of Onset  . Kidney disease Mother   . Cancer Sister        lymphoma   Social History   Socioeconomic History  . Marital status: Married    Spouse name: Not on file  . Number of children: Not on file  . Years of education: Not on file  . Highest education level: Not on file  Occupational History  . Not on file  Tobacco Use  . Smoking status: Never Smoker  . Smokeless tobacco: Never Used  . Tobacco comment: no tobacco     Substance and Sexual Activity  . Alcohol use: No  . Drug use: No  . Sexual activity: Not on file  Other Topics Concern  . Not on file  Social History Narrative   Lives in [place] with husband. Used to work full time as a Clinical cytogeneticist, now works one day/week.    No herbal meds. Vegetarian; no caffeine    Does not exercise regularly but is very active and denies any exertional symptoms.    Social Determinants of Health   Financial Resource Strain:   . Difficulty of Paying Living Expenses:   Food Insecurity:   . Worried About Charity fundraiser in the Last Year:   .  Ran Out of Food in the Last Year:   Transportation Needs:   . Film/video editor (Medical):   Marland Kitchen Lack of Transportation (Non-Medical):   Physical Activity:   . Days of Exercise per Week:   . Minutes of Exercise per Session:   Stress:   . Feeling of Stress :   Social Connections:   . Frequency of Communication with Friends and Family:   . Frequency of Social Gatherings with Friends and Family:   . Attends Religious Services:   . Active Member of Clubs or Organizations:   . Attends Archivist Meetings:   Marland Kitchen Marital Status:     Activities of Daily Living In your present state of health, do you have any difficulty performing the following activities: 02/03/2020  Hearing? N  Vision? N  Difficulty concentrating or making decisions? N  Walking or climbing stairs? N  Dressing or bathing? N  Doing errands, shopping? N  Preparing Food and eating ? N  Using the Toilet? N  In the past six months, have you accidently leaked urine? N  Do you have problems with loss of bowel control? N  Managing your Medications? N  Managing your Finances? N  Housekeeping or managing your Housekeeping? N  Some recent data might be hidden    Patient Education/ Literacy How often do you need to have someone help you when you read instructions, pamphlets, or other written materials from your doctor or pharmacy?: 1 -  Never What is the last grade level you completed in school?: 12th grade  Exercise Current Exercise Habits: The patient does not participate in regular exercise at present, Exercise limited by: None identified  Diet Patient reports consuming 3 meals a day and 2 snack(s) a day Patient reports that her primary diet is: Vegitarian Patient reports that she does have regular access to food.   Depression Screen PHQ 2/9 Scores 02/03/2020 01/14/2020 08/20/2019 04/20/2019 12/03/2018 07/16/2018 03/06/2018  PHQ - 2 Score 0 0 0 0 0 0 0     Fall Risk Fall Risk  02/03/2020 01/14/2020 08/20/2019 04/20/2019 12/03/2018  Falls in the past year? 0 0 0 0 0  Number falls in past yr: - - - - -  Injury with Fall? - - - - -     Objective:  [name] seemed alert and oriented and she participated appropriately during our telephone visit.  Blood Pressure Weight BMI  BP Readings from Last 3 Encounters:  01/14/20 (!) 101/56  08/20/19 112/60  07/01/19 110/62   Wt Readings from Last 3 Encounters:  01/14/20 151 lb (68.5 kg)  08/20/19 147 lb 9.6 oz (67 kg)  07/01/19 148 lb (67.1 kg)   BMI Readings from Last 1 Encounters:  01/14/20 24.37 kg/m    *Unable to obtain current vital signs, weight, and BMI due to telephone visit type  Hearing/Vision  . Blonnie did not seem to have difficulty with hearing/understanding during the telephone conversation . Reports that she has had a formal eye exam by an eye care professional within the past year . Reports that she has not had a formal hearing evaluation within the past year *Unable to fully assess hearing and vision during telephone visit type  Cognitive Function: 6CIT Screen 02/03/2020  What Year? 0 points  What month? 0 points  What time? 0 points  Count back from 20 0 points  Months in reverse 0 points  Repeat phrase 0 points  Total Score 0   (Normal:0-7, Significant for  Dysfunction: >8)  Normal Cognitive Function Screening: Yes   Immunization &  Health Maintenance Record Immunization History  Administered Date(s) Administered  . Fluad Quad(high Dose 65+) 08/20/2019  . Influenza Whole 08/05/2010  . Influenza, High Dose Seasonal PF 08/13/2016, 08/26/2017, 09/01/2018  . Influenza,inj,Quad PF,6+ Mos 08/19/2013, 08/19/2014, 08/11/2015  . Pneumococcal Conjugate-13 10/21/2013  . Pneumococcal Polysaccharide-23 11/05/1994, 08/27/2011  . Td 11/05/2006  . Tdap 01/11/2017    Health Maintenance  Topic Date Due  . COLON CANCER SCREENING ANNUAL FOBT  02/18/2020 (Originally 03/05/2017)  . PAP SMEAR-Modifier  05/25/2020 (Originally 01/31/2019)  . Fecal DNA (Cologuard)  08/19/2020 (Originally 03/03/1996)  . MAMMOGRAM  04/28/2020  . TETANUS/TDAP  01/12/2027  . INFLUENZA VACCINE  Completed  . DEXA SCAN  Completed  . Hepatitis C Screening  Completed  . PNA vac Low Risk Adult  Completed       Assessment  This is a routine wellness examination for [name].  Health Maintenance: Due or Overdue There are no preventive care reminders to display for this patient.  [name] does not need a referral for Community Assistance: Care Management:   no Social Work:    no Prescription Assistance:  no Nutrition/Diabetes Education:  no   Plan:  Personalized Goals Goals Addressed   None    Personalized Health Maintenance & Screening Recommendations   Lung Cancer Screening Recommended: no (Low Dose CT Chest recommended if Age 33-80 years, 30 pack-year currently smoking OR have quit w/in past 15 years) Hepatitis C Screening recommended: no HIV Screening recommended: no  Advanced Directives: Written information was not prepared per patient's request.  Referrals & Orders No orders of the defined types were placed in this encounter.   Follow-up Plan . Follow-up with Dettinger, Fransisca Kaufmann, MD as planned . Schedule 07/19/2020    I have personally reviewed and noted the following in the patient's chart:   . Medical and social  history . Use of alcohol, tobacco or illicit drugs  . Current medications and supplements . Functional ability and status . Nutritional status . Physical activity . Advanced directives . List of other physicians . Hospitalizations, surgeries, and ER visits in previous 12 months . Vitals . Screenings to include cognitive, depression, and falls . Referrals and appointments  In addition, I have reviewed and discussed with [name] certain preventive protocols, quality metrics, and best practice recommendations. A written personalized care plan for preventive services as well as general preventive health recommendations is available and can be mailed to the patient at her request.      Maud Deed [facility] [id number]

## 2020-02-03 NOTE — Telephone Encounter (Signed)
Patient aware and verbalized understanding. °

## 2020-03-21 ENCOUNTER — Encounter (INDEPENDENT_AMBULATORY_CARE_PROVIDER_SITE_OTHER): Payer: Self-pay | Admitting: Ophthalmology

## 2020-03-21 ENCOUNTER — Other Ambulatory Visit: Payer: Self-pay

## 2020-03-21 ENCOUNTER — Ambulatory Visit (INDEPENDENT_AMBULATORY_CARE_PROVIDER_SITE_OTHER): Payer: PPO | Admitting: Ophthalmology

## 2020-03-21 DIAGNOSIS — H348112 Central retinal vein occlusion, right eye, stable: Secondary | ICD-10-CM

## 2020-03-21 DIAGNOSIS — H35043 Retinal micro-aneurysms, unspecified, bilateral: Secondary | ICD-10-CM | POA: Diagnosis not present

## 2020-03-21 DIAGNOSIS — Z8619 Personal history of other infectious and parasitic diseases: Secondary | ICD-10-CM | POA: Insufficient documentation

## 2020-03-21 DIAGNOSIS — H43391 Other vitreous opacities, right eye: Secondary | ICD-10-CM | POA: Diagnosis not present

## 2020-03-21 DIAGNOSIS — H35061 Retinal vasculitis, right eye: Secondary | ICD-10-CM | POA: Diagnosis not present

## 2020-03-21 DIAGNOSIS — Z961 Presence of intraocular lens: Secondary | ICD-10-CM | POA: Diagnosis not present

## 2020-03-21 NOTE — Progress Notes (Signed)
03/21/2020     CHIEF COMPLAINT Patient presents for Retina Follow Up   HISTORY OF PRESENT ILLNESS: [name] is a 74 y.o. female who presents to the clinic today for:   HPI    Retina Follow Up    Patient presents with  CRVO/BRVO.  In both eyes.  Severity is moderate.  Duration of 1 year.  Since onset it is stable.  I, the attending physician,  performed the HPI with the patient and updated documentation appropriately.          Comments    1 Year f\u OU. FP  Pt states vision is stable. Pt sees one occasional floater. Pt had OD YAG in Dec and OS YAG in Jan with Dr. Katy Fitch.       Last edited by Tilda Franco on 03/21/2020  1:43 PM. (History)      Referring physician: Dettinger, Fransisca Kaufmann, MD [place],  [place] [address]  HISTORICAL INFORMATION:   Selected notes from the MEDICAL RECORD NUMBER       CURRENT MEDICATIONS: Current Outpatient Medications (Ophthalmic Drugs)  Medication Sig  . Artificial Tear Ointment (REFRESH LACRI-LUBE) OINT Apply 1 inch to eye at bedtime. Apply to bottom lid  . carboxymethylcellul-glycerin (REFRESH OPTIVE) 0.5-0.9 % ophthalmic solution Place 1 drop into both eyes in the morning and at bedtime.   No current facility-administered medications for this visit. (Ophthalmic Drugs)   Current Outpatient Medications (Other)  Medication Sig  . aspirin 325 MG EC tablet Take 325 mg by mouth daily.   . Cholecalciferol (VITAMIN D3) 2000 UNITS TABS Take 1 capsule by mouth daily.   . flecainide (TAMBOCOR) 50 MG tablet Take 1 tablet (50 mg total) by mouth 2 (two) times daily.  Marland Kitchen loratadine (CLARITIN) 10 MG tablet Take 10 mg by mouth daily as needed for allergies.   . Multiple Vitamins-Minerals (CENTRUM SILVER) tablet Take 1 tablet by mouth every morning.   . Multiple Vitamins-Minerals (PRESERVISION/LUTEIN PO) Take 1 capsule by mouth daily.  . Omega-3 Fatty Acids (FISH OIL) 1200 MG CAPS Take 1 capsule by mouth daily.  Marland Kitchen SYNTHROID 150 MCG  tablet TAKE 1 TABLET (150 MCG TOTAL) BY MOUTH AS DIRECTED.   No current facility-administered medications for this visit. (Other)      REVIEW OF SYSTEMS:    ALLERGIES Allergies  Allergen Reactions  . Penicillins Anaphylaxis  . Actonel [Risedronate Sodium]     Nausea and worsened heart burn  . Ceclor [Cefaclor] Hives  . Acetaminophen Nausea And Vomiting and Other (See Comments)    Headache  . Alendronate Sodium Nausea Only and Other (See Comments)    Heartburn, stomach upset  . Celecoxib Other (See Comments)    Stomach upset, heart burn  . Codeine Nausea And Vomiting and Other (See Comments)    Headache  . Ventolin [Kdc:Albuterol] Palpitations    Rapid heart beat    PAST MEDICAL HISTORY Past Medical History:  Diagnosis Date  . Atrial fibrillation (HCC)    PT ON FLECANIDE -DID NOT WANT TO BE ON BLOOD THINNERS OTHER THAN DAILY ASA  . Blurry vision, right eye    HX OF RT EYE VISION DISTURBANCE 01/2012 -PT SEEN BY DR. Merrick Feutz--ELEVATED B/P  THOUGHT TO BE RELATED TO [place] SPOTTED FEVER THOUGHT TO BE CAUSE OF VISION PROBLEM-HAD LASER OF BOTH EYES (SLIGHT DAMAGE TO LEFT  EYE).    . Cataract    BILATERAL  . GERD (gastroesophageal reflux disease)   .  Headache(784.0)    PAST HX MIGRAINES  . Hearing loss of right ear   . Hypertension    B/P HAS BEEN UP AND DOWN BUT MOSTLY DOWN WHILE ON METOPROLOL -WAS TAKEN OFF METOROLOL IN NOV 2012 BY CARDIOLOGIST AND THEN IN MARCH 2013 PT'S B/P BECAME REALLY ELEVATED-SHE RESTARTED METOPROLOL  AND WAS GIVEN BENICAR.  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 Hypothyroidism   . Mitral valve prolapse    per pt report, not listed on echo  . MR (mitral regurgitation)    none on echo in 2013  . Osteoarthritis    LITTLE FINGER LEFT HAND  . Palpitations JUNE 2012   piror to diagnosis of SVT  -NO PROBLMS WITH REOCCURANCE  . Pheochromocytoma   . PONV (postoperative nausea and vomiting)    HX OF N&V AFTER  SURGERIES-EXCEPT NO PROBLEM AFTER WRIST SURGERY IN 2012-AT [facility]  . Thyroid cancer [facility])    age 74; s/p resection with subsequent hypothyroidism. 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 TMJ locking    PT STATES IF MOUTH OPEN EXTENDED TIME-LIKE AT DENTIST--HER JAWS LOCK  . TR (tricuspid regurgitation)   . Vegetarian diet    DOES EAT EGGS AND DAIRY--NO MEATS OR FISH   Past Surgical History:  Procedure Laterality Date  . ADRENALECTOMY  07/03/2012   Procedure: ADRENALECTOMY;  Surgeon: Dutch Gray, MD;  Location: [facility];  Service: Urology;  Laterality: Right;  . APPENDECTOMY    . Lung biopsies    . THYROIDECTOMY    . WRIST SURGERY      FAMILY HISTORY Family History  Problem Relation Age of Onset  . Kidney disease Mother   . Cancer Sister        lymphoma    SOCIAL HISTORY Social History   Tobacco Use  . Smoking status: Never Smoker  . Smokeless tobacco: Never Used  . Tobacco comment: no tobacco   Substance Use Topics  . Alcohol use: No  . Drug use: No         OPHTHALMIC EXAM: Base Eye Exam    Visual Acuity (Snellen - Linear)      Right Left   Dist [place] 20/25 + 20/20       Tonometry (Tonopen, 1:48 PM)      Right Left   Pressure 10 10       Pupils      Pupils Dark Light Shape React APD   Right PERRL 4 3 Round Brisk None   Left PERRL 4 3 Round Brisk None       Visual Fields (Counting fingers)      Left Right    Full Full       Neuro/Psych    Oriented x3: Yes   Mood/Affect: Normal       Dilation    Both eyes: 1.0% Mydriacyl, 2.5% Phenylephrine @ 1:48 PM        Slit Lamp and Fundus Exam    External Exam      Right Left   External Normal Normal       Slit Lamp Exam      Right Left   Lids/Lashes Normal Normal   Conjunctiva/Sclera White and quiet White and quiet   Cornea Clear Clear   Anterior Chamber Deep and quiet Deep and quiet   Iris Round and reactive Round and reactive   Lens Posterior chamber intraocular lens Posterior  chamber intraocular lens  Anterior Vitreous Normal Normal       Fundus Exam      Right Left   Posterior Vitreous Normal    Disc Normal    C/D Ratio 0.35    Macula Normal    Vessels Normal    Periphery Mild scatter photocoagulation nasally.           IMAGING AND PROCEDURES  Imaging and Procedures for 03/21/20  Color Fundus Photography Optos - OU - Both Eyes       Right Eye Progression has been stable. Disc findings include normal observations. Macula : normal observations. Vessels : normal observations.   Left Eye Progression has been stable. Disc findings include normal observations. Macula : normal observations. Vessels : normal observations. Periphery : normal observations.   Notes Mild scatter photocoagulation, treatment was at that time for what was apparent retinal vasculitis.  Now stable.                ASSESSMENT/PLAN:  No problem-specific Assessment & Plan notes found for this encounter.      ICD-10-CM   1. Central retinal vein occlusion of right eye, unspecified complication status  99991111 Color Fundus Photography Optos - OU - Both Eyes  2. Retinal microaneurysm of both eyes  H35.043 Color Fundus Photography Optos - OU - Both Eyes  3. Pseudophakia of both eyes  Z96.1   4. Other vitreous opacities, right eye  H43.391   5. 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  2.  3.  Ophthalmic Meds Ordered this visit:  No orders of the defined types were placed in this encounter.      Return in about 1 year (around 03/21/2021) for DILATE OU, OCT.  There are no Patient Instructions on file for this visit.   Explained the diagnoses, plan, and follow up with the patient and they expressed understanding.  Patient expressed understanding of the importance of proper follow up care.   Clent Demark Santanna Olenik M.D. Diseases & Surgery of the Retina and Vitreous Retina & Diabetic [place] 03/21/20     Abbreviations: M myopia (nearsighted); A astigmatism; H hyperopia (farsighted); P presbyopia; Mrx spectacle prescription;  CTL contact lenses; OD right eye; OS left eye; OU both eyes  XT exotropia; ET esotropia; PEK punctate epithelial keratitis; PEE punctate epithelial erosions; DES dry eye syndrome; MGD meibomian gland dysfunction; ATs artificial tears; PFAT's preservative free artificial tears; O'Brien nuclear sclerotic cataract; PSC posterior subcapsular cataract; ERM epi-retinal membrane; PVD posterior vitreous detachment; RD retinal detachment; DM diabetes mellitus; DR diabetic retinopathy; NPDR non-proliferative diabetic retinopathy; PDR proliferative diabetic retinopathy; CSME clinically significant macular edema; DME diabetic macular edema; dbh dot blot hemorrhages; CWS cotton wool spot; POAG primary open angle glaucoma; C/D cup-to-disc ratio; HVF humphrey visual field; GVF goldmann visual field; OCT optical coherence tomography; IOP intraocular pressure; BRVO Branch retinal vein occlusion; CRVO central retinal vein occlusion; CRAO central retinal artery occlusion; BRAO branch retinal artery occlusion; RT retinal tear; SB scleral buckle; PPV pars plana vitrectomy; VH Vitreous hemorrhage; PRP panretinal laser photocoagulation; IVK intravitreal kenalog; VMT vitreomacular traction; MH Macular hole;  NVD neovascularization of the disc; NVE neovascularization elsewhere; AREDS age related eye disease study; ARMD age related macular degeneration; POAG primary open angle glaucoma; EBMD epithelial/anterior basement membrane dystrophy; ACIOL anterior chamber intraocular lens; IOL intraocular lens; PCIOL posterior chamber intraocular lens; Phaco/IOL phacoemulsification with intraocular lens placement; PRK photorefractive keratectomy; LASIK  laser assisted in situ keratomileusis; HTN hypertension; DM diabetes mellitus; COPD chronic obstructive pulmonary disease

## 2020-04-02 ENCOUNTER — Other Ambulatory Visit: Payer: Self-pay | Admitting: Family Medicine

## 2020-04-02 DIAGNOSIS — C73 Malignant neoplasm of thyroid gland: Secondary | ICD-10-CM

## 2020-04-05 ENCOUNTER — Telehealth: Payer: Self-pay | Admitting: Family Medicine

## 2020-04-05 ENCOUNTER — Other Ambulatory Visit: Payer: Self-pay

## 2020-04-05 MED ORDER — LEVOTHYROXINE SODIUM 137 MCG PO TABS: 137.0000 ug | ORAL_TABLET | Freq: Every day | ORAL | 3 refills | Status: DC

## 2020-04-24 ENCOUNTER — Other Ambulatory Visit: Payer: Self-pay | Admitting: Family Medicine

## 2020-04-24 DIAGNOSIS — I480 Paroxysmal atrial fibrillation: Secondary | ICD-10-CM

## 2020-06-03 ENCOUNTER — Ambulatory Visit: Payer: PPO | Admitting: *Deleted

## 2020-06-03 DIAGNOSIS — M810 Age-related osteoporosis without current pathological fracture: Secondary | ICD-10-CM

## 2020-06-03 DIAGNOSIS — I480 Paroxysmal atrial fibrillation: Secondary | ICD-10-CM

## 2020-06-03 DIAGNOSIS — E039 Hypothyroidism, unspecified: Secondary | ICD-10-CM

## 2020-06-03 NOTE — Patient Instructions (Signed)
Ms. Warbington was given information about Chronic Care Management services today including:  1. CCM service includes personalized support from designated clinical staff supervised by her physician, including individualized plan of care and coordination with other care providers 2. 24/7 contact phone numbers for assistance for urgent and routine care needs. 3. Service will only be billed when office clinical staff spend 20 minutes or more in a month to coordinate care. 4. Only one practitioner may furnish and bill the service in a calendar month. 5. The patient may stop CCM services at any time (effective at the end of the month) by phone call to the office staff. 6. The patient will be responsible for cost sharing (co-pay) of up to 20% of the service fee (after annual deductible is met).  Enrollment status changed to "Previously enrolled" per patient's request after discussion. She will reach out in the future if services are needed.     Chong Sicilian, BSN, RN-BC Embedded Chronic Care Manager [facility] / [place] [facility] Direct Dial: [phone]

## 2020-06-03 NOTE — Chronic Care Management (AMB) (Signed)
  Chronic Care Management   Initial Visit Note  06/03/2020 Name: [name] MRN: [id number] DOB: 29-Dec-1945  Referred by: Dettinger, Fransisca Kaufmann, MD Reason for referral : No chief complaint on file.   [name] is a 74 y.o. year old female who is a primary care patient of Dettinger, Fransisca Kaufmann, MD. The CCM team was consulted for assistance with chronic disease management and care coordination needs related to hypothyroidism, osteoporosis, and afib.   Review of patient status, including review of consultants reports, relevant laboratory and other test results, and collaboration with appropriate care team members and the patient's provider was performed as part of comprehensive patient evaluation and provision of chronic care management services.    Subjective: I spoke with Ms [name] by telephone today regarding management of her chronic medical conditions. She does not have any resource or CCM needs at this time and feels that her medical conditions are well controlled. She appreciated the telephone call but declines to participate in the CCM program at this time. She will reach out in the future if services are needed.   SDOH (Social Determinants of Health) assessments performed: Yes See Care Plan activities for detailed interventions related to SDOH     Medications: Outpatient Encounter Medications as of 06/03/2020  Medication Sig  . Artificial Tear Ointment (REFRESH LACRI-LUBE) OINT Apply 1 inch to eye at bedtime. Apply to bottom lid  . aspirin 325 MG EC tablet Take 325 mg by mouth daily.   . carboxymethylcellul-glycerin (REFRESH OPTIVE) 0.5-0.9 % ophthalmic solution Place 1 drop into both eyes in the morning and at bedtime.  . Cholecalciferol (VITAMIN D3) 2000 UNITS TABS Take 1 capsule by mouth daily.   . flecainide (TAMBOCOR) 50 MG tablet TAKE 1 TABLET BY MOUTH TWICE A DAY  . levothyroxine (SYNTHROID) 137 MCG tablet Take 1 tablet (137 mcg total) by mouth daily before breakfast.    . loratadine (CLARITIN) 10 MG tablet Take 10 mg by mouth daily as needed for allergies.   . Multiple Vitamins-Minerals (CENTRUM SILVER) tablet Take 1 tablet by mouth every morning.   . Multiple Vitamins-Minerals (PRESERVISION/LUTEIN PO) Take 1 capsule by mouth daily.  . Omega-3 Fatty Acids (FISH OIL) 1200 MG CAPS Take 1 capsule by mouth daily.   No facility-administered encounter medications on file as of 06/03/2020.    Plan:   CCM enrollment status changed to "previously enrolled" as per patient request on 06/03/20 to discontinue enrollment. Case closed to case management services in primary care home.   Chong Sicilian, BSN, RN-BC Embedded Chronic Care Manager [facility] / [place] [facility] Direct Dial: [phone]

## 2020-06-28 DIAGNOSIS — Z1231 Encounter for screening mammogram for malignant neoplasm of breast: Secondary | ICD-10-CM | POA: Diagnosis not present

## 2020-07-19 ENCOUNTER — Other Ambulatory Visit: Payer: Self-pay

## 2020-07-19 ENCOUNTER — Ambulatory Visit (INDEPENDENT_AMBULATORY_CARE_PROVIDER_SITE_OTHER): Payer: PPO | Admitting: Family Medicine

## 2020-07-19 ENCOUNTER — Encounter: Payer: Self-pay | Admitting: Family Medicine

## 2020-07-19 VITALS — BP 100/60 | HR 82 | Temp 98.0°F | Ht 66.0 in | Wt 148.0 lb

## 2020-07-19 DIAGNOSIS — E039 Hypothyroidism, unspecified: Secondary | ICD-10-CM | POA: Diagnosis not present

## 2020-07-19 DIAGNOSIS — I480 Paroxysmal atrial fibrillation: Secondary | ICD-10-CM | POA: Diagnosis not present

## 2020-07-19 DIAGNOSIS — Z780 Asymptomatic menopausal state: Secondary | ICD-10-CM | POA: Diagnosis not present

## 2020-07-19 DIAGNOSIS — Z85850 Personal history of malignant neoplasm of thyroid: Secondary | ICD-10-CM

## 2020-07-19 DIAGNOSIS — E559 Vitamin D deficiency, unspecified: Secondary | ICD-10-CM | POA: Diagnosis not present

## 2020-07-19 MED ORDER — FLECAINIDE ACETATE 50 MG PO TABS: 50.0000 mg | ORAL_TABLET | Freq: Two times a day (BID) | ORAL | 0 refills | Status: DC

## 2020-07-19 MED ORDER — LEVOTHYROXINE SODIUM 137 MCG PO TABS: 137.0000 ug | ORAL_TABLET | Freq: Every day | ORAL | 3 refills | Status: DC

## 2020-07-19 NOTE — Progress Notes (Signed)
BP 100/60   Pulse 82   Temp 98 F (36.7 C)   Ht 5\' 6"  (1.676 m)   Wt 148 lb (67.1 kg)   SpO2 95%   BMI 23.89 kg/m    Subjective:   Patient ID: [name], female    DOB: March 01, 1946, 74 y.o.   MRN: [id number]  HPI: [name] is a 74 y.o. female presenting on 07/19/2020 for Medical Management of Chronic Issues and Hypothyroidism   HPI Hypothyroidism recheck Patient is coming in for thyroid recheck today as well. They deny any issues with hair changes or heat or cold problems or diarrhea or constipation. They deny any chest pain or palpitations. They are currently on levothyroxine 137 micrograms   Patient has A. fib and is coming in for recheck. Also sees cardiology and takes flecainide for this. Denies any issues.   Relevant past medical, surgical, family and social history reviewed and updated as indicated. Interim medical history since our last visit reviewed. Allergies and medications reviewed and updated.  Review of Systems  Constitutional: Negative for chills and fever.  Eyes: Negative for visual disturbance.  Respiratory: Negative for chest tightness and shortness of breath.   Cardiovascular: Negative for chest pain and leg swelling.  Musculoskeletal: Negative for back pain and gait problem.  Skin: Negative for rash.  Neurological: Negative for light-headedness and headaches.  Psychiatric/Behavioral: Negative for agitation and behavioral problems.  All other systems reviewed and are negative.   Per HPI unless specifically indicated above   Allergies as of 07/19/2020      Reactions   Penicillins Anaphylaxis   Actonel [risedronate Sodium]    Nausea and worsened heart burn   Ceclor [cefaclor] Hives   Acetaminophen Nausea And Vomiting, Other (See Comments)   Headache   Alendronate Sodium Nausea Only, Other (See Comments)   Heartburn, stomach upset   Celecoxib Other (See Comments)   Stomach upset, heart burn   Codeine Nausea And Vomiting, Other (See  Comments)   Headache   Ventolin [kdc:albuterol] Palpitations   Rapid heart beat      Medication List       Accurate as of July 19, 2020 11:38 AM. If you have any questions, ask your nurse or doctor.        aspirin 325 MG EC tablet Take 325 mg by mouth daily.   carboxymethylcellul-glycerin 0.5-0.9 % ophthalmic solution Commonly known as: REFRESH OPTIVE Place 1 drop into both eyes in the morning and at bedtime.   Centrum Silver tablet Take 1 tablet by mouth every morning.   PRESERVISION/LUTEIN PO Take 1 capsule by mouth daily.   Fish Oil 1200 MG Caps Take 1 capsule by mouth daily.   flecainide 50 MG tablet Commonly known as: TAMBOCOR Take 1 tablet (50 mg total) by mouth 2 (two) times daily.   levothyroxine 137 MCG tablet Commonly known as: Synthroid Take 1 tablet (137 mcg total) by mouth daily before breakfast.   loratadine 10 MG tablet Commonly known as: CLARITIN Take 10 mg by mouth daily as needed for allergies.   Refresh Lacri-Lube Oint Apply 1 inch to eye at bedtime. Apply to bottom lid   Vitamin D3 50 MCG (2000 UT) Tabs Take 1 capsule by mouth daily.        Objective:   BP 100/60   Pulse 82   Temp 98 F (36.7 C)   Ht 5\' 6"  (1.676 m)   Wt 148 lb (67.1 kg)   SpO2 95%  BMI 23.89 kg/m   Wt Readings from Last 3 Encounters:  07/19/20 148 lb (67.1 kg)  01/14/20 151 lb (68.5 kg)  08/20/19 147 lb 9.6 oz (67 kg)    Physical Exam Vitals and nursing note reviewed.  Constitutional:      General: She is not in acute distress.    Appearance: She is well-developed. She is not diaphoretic.  Eyes:     Conjunctiva/sclera: Conjunctivae normal.  Cardiovascular:     Rate and Rhythm: Normal rate and regular rhythm.     Heart sounds: Normal heart sounds. No murmur heard.   Pulmonary:     Effort: Pulmonary effort is normal. No respiratory distress.     Breath sounds: Normal breath sounds. No wheezing.  Musculoskeletal:        General: No tenderness.  Normal range of motion.  Skin:    General: Skin is warm and dry.     Findings: No rash.  Neurological:     Mental Status: She is alert and oriented to person, place, and time.     Coordination: Coordination normal.  Psychiatric:        Behavior: Behavior normal.       Assessment & Plan:   Problem List Items Addressed This Visit      Cardiovascular and Mediastinum   Atrial fibrillation (HCC)   Relevant Medications   flecainide (TAMBOCOR) 50 MG tablet   Other Relevant Orders   CBC with Differential/Platelet     Endocrine   Hypothyroidism - Primary   Relevant Medications   levothyroxine (SYNTHROID) 137 MCG tablet   Other Relevant Orders   CBC with Differential/Platelet   TSH     Other   History of thyroid cancer   Vitamin D deficiency   Relevant Orders   VITAMIN D 25 Hydroxy (Vit-D Deficiency, Fractures)    Other Visit Diagnoses    Postmenopausal       Relevant Orders   DG [facility] DEXA      Continue current dose, will check blood work and follow-up after that. Follow up plan: Return in about 6 months (around 01/16/2021), or if symptoms worsen or fail to improve, for A. fib and thyroid recheck.  Counseling provided for all of the vaccine components Orders Placed This Encounter  Procedures  . DG [facility] DEXA  . CBC with Differential/Platelet  . VITAMIN D 25 Hydroxy (Vit-D Deficiency, Fractures)  . TSH    Caryl Pina, MD [facility] 07/19/2020, 11:38 AM

## 2020-07-20 LAB — CBC WITH DIFFERENTIAL/PLATELET
Basophils Absolute: 0 10*3/uL (ref 0.0–0.2)
Basos: 0 %
EOS (ABSOLUTE): 0.5 10*3/uL — ABNORMAL HIGH (ref 0.0–0.4)
Eos: 7 %
Hematocrit: 39.9 % (ref 34.0–46.6)
Hemoglobin: 13.2 g/dL (ref 11.1–15.9)
Immature Grans (Abs): 0 10*3/uL (ref 0.0–0.1)
Immature Granulocytes: 0 %
Lymphocytes Absolute: 2.1 10*3/uL (ref 0.7–3.1)
Lymphs: 30 %
MCH: 29.7 pg (ref 26.6–33.0)
MCHC: 33.1 g/dL (ref 31.5–35.7)
MCV: 90 fL (ref 79–97)
Monocytes Absolute: 0.6 10*3/uL (ref 0.1–0.9)
Monocytes: 8 %
Neutrophils Absolute: 3.8 10*3/uL (ref 1.4–7.0)
Neutrophils: 55 %
Platelets: 170 10*3/uL (ref 150–450)
RBC: 4.44 x10E6/uL (ref 3.77–5.28)
RDW: 12.8 % (ref 11.7–15.4)
WBC: 7 10*3/uL (ref 3.4–10.8)

## 2020-07-20 LAB — TSH: TSH: <0.005 u[IU]/mL — ABNORMAL LOW (ref 0.450–4.500)

## 2020-07-20 LAB — VITAMIN D 25 HYDROXY (VIT D DEFICIENCY, FRACTURES): Vit D, 25-Hydroxy: 44 ng/mL (ref 30.0–100.0)

## 2020-08-29 ENCOUNTER — Other Ambulatory Visit: Payer: Self-pay

## 2020-08-29 ENCOUNTER — Other Ambulatory Visit (HOSPITAL_COMMUNITY)
Admission: RE | Admit: 2020-08-29 | Discharge: 2020-08-29 | Disposition: A | Discharge status: Home / Self Care | Payer: PPO | Source: Ambulatory Visit | Attending: Nurse Practitioner | Admitting: Nurse Practitioner

## 2020-08-29 ENCOUNTER — Ambulatory Visit (INDEPENDENT_AMBULATORY_CARE_PROVIDER_SITE_OTHER): Payer: PPO

## 2020-08-29 ENCOUNTER — Encounter: Payer: Self-pay | Admitting: Nurse Practitioner

## 2020-08-29 ENCOUNTER — Ambulatory Visit (INDEPENDENT_AMBULATORY_CARE_PROVIDER_SITE_OTHER): Payer: PPO | Admitting: Nurse Practitioner

## 2020-08-29 VITALS — BP 112/62 | HR 109 | Temp 97.5°F | Resp 20 | Ht 66.0 in | Wt 150.0 lb

## 2020-08-29 DIAGNOSIS — Z23 Encounter for immunization: Secondary | ICD-10-CM | POA: Diagnosis not present

## 2020-08-29 DIAGNOSIS — Z01419 Encounter for gynecological examination (general) (routine) without abnormal findings: Secondary | ICD-10-CM | POA: Diagnosis not present

## 2020-08-29 DIAGNOSIS — Z780 Asymptomatic menopausal state: Secondary | ICD-10-CM | POA: Diagnosis not present

## 2020-08-29 DIAGNOSIS — Z9189 Other specified personal risk factors, not elsewhere classified: Secondary | ICD-10-CM | POA: Diagnosis not present

## 2020-08-29 DIAGNOSIS — I340 Nonrheumatic mitral (valve) insufficiency: Secondary | ICD-10-CM | POA: Insufficient documentation

## 2020-08-29 DIAGNOSIS — I447 Left bundle-branch block, unspecified: Secondary | ICD-10-CM | POA: Insufficient documentation

## 2020-08-29 DIAGNOSIS — M810 Age-related osteoporosis without current pathological fracture: Secondary | ICD-10-CM | POA: Diagnosis not present

## 2020-08-29 NOTE — Progress Notes (Signed)
Subjective:    Patient ID: [name], female    DOB: [birth year], 74 y.o.   MRN: [id number]  Chief Complaint: Gynecologic Exam   HPI Pt a regular pt of Dr Warrick Parisian, last seen for a chronic f/u on 07/19/2020. Here today for pap exam & DEXA scan only. No other complaints or concerns today.   Review of Systems  Constitutional: Negative.   HENT: Negative.   Eyes: Negative.   Respiratory: Negative.   Cardiovascular: Negative.   Gastrointestinal: Negative.   Endocrine: Negative.   Genitourinary: Negative.   Musculoskeletal: Negative.   Skin: Negative.   Allergic/Immunologic: Negative.   Neurological: Negative.   Hematological: Negative.   Psychiatric/Behavioral: Negative.   All other systems reviewed and are negative.      Objective:   Physical Exam Vitals and nursing note reviewed. Exam conducted with a chaperone present.  Constitutional:      Appearance: Normal appearance.  HENT:     Head: Normocephalic and atraumatic.     Right Ear: External ear normal.     Left Ear: External ear normal.     Nose: Nose normal.     Mouth/Throat:     Mouth: Mucous membranes are moist.     Pharynx: Oropharynx is clear.  Eyes:     Extraocular Movements: Extraocular movements intact.     Conjunctiva/sclera: Conjunctivae normal.     Pupils: Pupils are equal, round, and reactive to light.  Cardiovascular:     Rate and Rhythm: Normal rate.     Pulses: Normal pulses.  Pulmonary:     Effort: Pulmonary effort is normal.  Chest:     Breasts:        Right: Normal. No inverted nipple, mass, nipple discharge or tenderness.        Left: Normal. No inverted nipple, mass, nipple discharge or tenderness.  Abdominal:     General: Abdomen is flat.     Palpations: Abdomen is soft.  Genitourinary:    General: Normal vulva.     Exam position: Supine.     Vagina: No vaginal discharge.     Rectum: Normal.     Comments: No adnexal masses or tenderness Musculoskeletal:        General:  Normal range of motion.     Cervical back: Normal range of motion.  Skin:    General: Skin is warm and dry.     Capillary Refill: Capillary refill takes less than 2 seconds.  Neurological:     General: No focal deficit present.     Mental Status: She is alert and oriented to person, place, and time. Mental status is at baseline.  Psychiatric:        Mood and Affect: Mood normal.        Behavior: Behavior normal.        Thought Content: Thought content normal.        Judgment: Judgment normal.     BP 112/62   Pulse (!) 109   Temp (!) 97.5 F (36.4 C) (Temporal)   Resp 20   Ht 5\' 6"  (1.676 m)   Wt 150 lb (68 kg)   SpO2 98%   BMI 24.21 kg/m      Assessment & Plan:  [name] in today with chief complaint of Gynecologic Exam   1. GYN exam for high-risk Medicare patient Labs pending - Cytology - PAP    The above assessment and management plan was discussed with the patient. The patient verbalized  understanding of and has agreed to the management plan. Patient is aware to call the clinic if symptoms persist or worsen. Patient is aware when to return to the clinic for a follow-up visit. Patient educated on when it is appropriate to go to the emergency department.   Galaxy-Margaret Hassell Done, FNP

## 2020-08-29 NOTE — Progress Notes (Signed)
Cardiology Office Note   Date:  08/31/2020   ID:  DELENN AHN, DOB [birth year], MRN [id number]  PCP:  Dettinger, Fransisca Kaufmann, MD  Cardiologist:   No primary care provider on file.   Chief Complaint  Patient presents with   Atrial Fibrillation      History of Present Illness: [name] is a 74 y.o. female who presents for follow up of atrial fib.   She has had surgery for treatment of  pheochromocytoma.  She has moderate tricuspid regurgitation with no evidence of pulmonary hypertension.   I checked this on echo last in 2019.  She returns for follow up .  She had done very well.  The patient denies any new symptoms such as chest discomfort, neck or arm discomfort. There has been no new shortness of breath, PND or orthopnea. There have been no reported palpitations, presyncope or syncope.  She only works a few days.  She is active however around her yard and home.  She climbs stairs.  The patient denies any new symptoms such as chest discomfort, neck or arm discomfort. There has been no new shortness of breath, PND or orthopnea. There have been no reported palpitations, presyncope or syncope.    Past Medical History:  Diagnosis Date   Atrial fibrillation (HCC)    PT ON FLECANIDE -DID NOT WANT TO BE ON BLOOD THINNERS OTHER THAN DAILY ASA   Blurry vision, right eye    HX OF RT EYE VISION DISTURBANCE 01/2012 -PT SEEN BY DR. RANKIN--ELEVATED B/P  THOUGHT TO BE RELATED TO [place] SPOTTED FEVER THOUGHT TO BE CAUSE OF VISION PROBLEM-HAD LASER OF BOTH EYES (SLIGHT DAMAGE TO LEFT  EYE).     Cataract    BILATERAL   GERD (gastroesophageal reflux disease)    Headache(784.0)    PAST HX MIGRAINES   Hearing loss of right ear    Hypertension    B/P HAS BEEN UP AND DOWN BUT MOSTLY DOWN WHILE ON METOPROLOL -WAS TAKEN OFF METOROLOL IN NOV 2012 BY CARDIOLOGIST AND THEN IN MARCH 2013 PT'S B/P BECAME REALLY ELEVATED-SHE RESTARTED METOPROLOL  AND WAS GIVEN BENICAR.  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 hx of mets to luns, which was treated (unsure of how)   TMJ locking    PT STATES IF MOUTH OPEN EXTENDED TIME-LIKE AT DENTIST--HER JAWS LOCK   TR (tricuspid regurgitation)    Vegetarian diet    DOES EAT EGGS AND DAIRY--NO MEATS OR FISH    Past Surgical History:  Procedure Laterality Date   ADRENALECTOMY  07/03/2012   Procedure: ADRENALECTOMY;  Surgeon: Dutch Gray, MD;  Location: [facility];  Service: Urology;  Laterality: Right;   APPENDECTOMY     Lung biopsies     THYROIDECTOMY     WRIST SURGERY       Current Outpatient Medications  Medication  Sig Dispense Refill   Artificial Tear Ointment (REFRESH LACRI-LUBE) OINT Apply 1 inch to eye at bedtime. Apply to bottom lid     aspirin 325 MG EC tablet Take 325 mg by mouth daily.      carboxymethylcellul-glycerin (REFRESH OPTIVE) 0.5-0.9 % ophthalmic solution Place 1 drop into both eyes in the morning and at bedtime.     Cholecalciferol (VITAMIN D3) 2000 UNITS TABS Take 1 capsule by mouth daily.      flecainide (TAMBOCOR) 50 MG tablet Take 1 tablet (50 mg total) by mouth 2 (two) times daily. 180 tablet 0   levothyroxine (SYNTHROID) 137 MCG tablet Take 1 tablet (137 mcg total) by mouth daily before breakfast. 90 tablet 3   loratadine (CLARITIN) 10 MG tablet Take 10 mg by mouth daily as needed for allergies.      Multiple Vitamins-Minerals (CENTRUM SILVER) tablet Take 1 tablet by  mouth every morning.      Multiple Vitamins-Minerals (PRESERVISION/LUTEIN PO) Take 1 capsule by mouth daily.     Omega-3 Fatty Acids (FISH OIL) 1200 MG CAPS Take 1 capsule by mouth daily.     No current facility-administered medications for this visit.    Allergies:   Penicillins, Actonel [risedronate sodium], Ceclor [cefaclor], Acetaminophen, Alendronate sodium, Celecoxib, Codeine, and Ventolin [kdc:albuterol]    ROS:  Please see the history of present illness.   Otherwise, review of systems are positive for none.   All other systems are reviewed and negative.    PHYSICAL EXAM: VS:  BP 112/62    Pulse 71    Ht 5\' 6"  (1.676 m)    Wt 149 lb (67.6 kg)    BMI 24.05 kg/m  , BMI Body mass index is 24.05 kg/m. GENERAL:  Well appearing NECK:  No jugular venous distention, waveform within normal limits, carotid upstroke brisk and symmetric, no bruits, no thyromegaly LUNGS:  Clear to auscultation bilaterally CHEST:  Unremarkable HEART:  PMI not displaced or sustained,S1 and S2 within normal limits, no S3, no S4, no clicks, no rubs, very soft nonradiating apical systolic murmur, no diastolic murmurs ABD:  Flat, positive bowel sounds normal in frequency in pitch, no bruits, no rebound, no guarding, no midline pulsatile mass, no hepatomegaly, no splenomegaly EXT:  2 plus pulses throughout, no edema, no cyanosis no clubbing   EKG:  EKG is ordered today. The ekg ordered today demonstrates sinus rhythm, rate seventy-one, left bundle branch block, no change from previous.   Recent Labs: 01/28/2020: ALT 13; BUN 9; Creatinine, Ser 0.62; Potassium 4.4; Sodium 138 07/19/2020: Hemoglobin 13.2; Platelets 170; TSH <0.005    Lipid Panel    Component Value Date/Time   CHOL 144 01/28/2020 0830   CHOL 178 02/16/2013 0959   TRIG 50 01/28/2020 0830   TRIG 37 01/11/2017 1004   TRIG 33 02/16/2013 0959   HDL 66 01/28/2020 0830   HDL 84 01/11/2017 1004   HDL 94 02/16/2013 0959   CHOLHDL 2.2 01/28/2020  0830   LDLCALC 67 01/28/2020 0830   LDLCALC 63 06/24/2014 1006   LDLCALC 77 02/16/2013 0959      Wt Readings from Last 3 Encounters:  08/31/20 149 lb (67.6 kg)  08/29/20 150 lb (68 kg)  07/19/20 148 lb (67.1 kg)      Other studies Reviewed: Additional studies/ records that were reviewed today include: Labs. Review of the above records demonstrates:  Please see elsewhere in the note.     ASSESSMENT AND PLAN:  HYPERTENSION -  The blood  pressure is at target.  No change in therapy.   Blood pressures been controlled since treatment of pheochromocytoma.  She does have her labs followed closely by her primary provider since her adrenalectomy.  I did review and she has stable very low TSH.  Other labs are unremarkable.  ATRIAL FIBRILLATION -  She has had no further paroxysms.  No change in therapy.  She was very sensitive to calcium channel blockers and beta-blockers in the past.  These would need to be used very cautiously if ever indicated in the future.  TRICUSPID REGURGITATION - I do not suspect clinically that this is any worse than previous.  No change in therapy.=  MITRAL REGURGITATION -  As above.  I will follow this with physical exams.   LBBB - This is chronic and unchanged.  No change in therapy.     Current medicines are reviewed at length with the patient today.  The patient does not have concerns regarding medicines.  The following changes have been made:  no change  Labs/ tests ordered today include: None  Orders Placed This Encounter  Procedures   EKG 12-Lead     Disposition:   FU with me in one year.     Signed, Minus Breeding, MD  08/31/2020 12:41 PM    [facility]

## 2020-08-29 NOTE — Patient Instructions (Signed)

## 2020-08-30 DIAGNOSIS — Z23 Encounter for immunization: Secondary | ICD-10-CM | POA: Diagnosis not present

## 2020-08-30 DIAGNOSIS — Z01419 Encounter for gynecological examination (general) (routine) without abnormal findings: Secondary | ICD-10-CM | POA: Diagnosis not present

## 2020-08-31 ENCOUNTER — Encounter: Payer: Self-pay | Admitting: Cardiology

## 2020-08-31 ENCOUNTER — Other Ambulatory Visit: Payer: Self-pay

## 2020-08-31 ENCOUNTER — Ambulatory Visit: Payer: PPO | Admitting: Cardiology

## 2020-08-31 VITALS — BP 112/62 | HR 71 | Ht 66.0 in | Wt 149.0 lb

## 2020-08-31 DIAGNOSIS — I447 Left bundle-branch block, unspecified: Secondary | ICD-10-CM

## 2020-08-31 DIAGNOSIS — I10 Essential (primary) hypertension: Secondary | ICD-10-CM

## 2020-08-31 DIAGNOSIS — I340 Nonrheumatic mitral (valve) insufficiency: Secondary | ICD-10-CM | POA: Diagnosis not present

## 2020-08-31 DIAGNOSIS — I361 Nonrheumatic tricuspid (valve) insufficiency: Secondary | ICD-10-CM | POA: Diagnosis not present

## 2020-08-31 DIAGNOSIS — I480 Paroxysmal atrial fibrillation: Secondary | ICD-10-CM

## 2020-08-31 LAB — CYTOLOGY - PAP: Diagnosis: NEGATIVE

## 2020-08-31 NOTE — Patient Instructions (Signed)
Medication Instructions:  The current medical regimen is effective;  continue present plan and medications.  *If you need a refill on your cardiac medications before your next appointment, please call your pharmacy*  Follow-Up: At [facility], you and your health needs are our priority.  As part of our continuing mission to provide you with exceptional heart care, we have created designated Provider Care Teams.  These Care Teams include your primary Cardiologist (physician) and Advanced Practice Providers (APPs -  Physician Assistants and Nurse Practitioners) who all work together to provide you with the care you need, when you need it.  We recommend signing up for the patient portal called "MyChart".  Sign up information is provided on this After Visit Summary.  MyChart is used to connect with patients for Virtual Visits (Telemedicine).  Patients are able to view lab/test results, encounter notes, upcoming appointments, etc.  Non-urgent messages can be sent to your provider as well.   To learn more about what you can do with MyChart, go to [email or web address].    Your next appointment:   12 month(s)  The format for your next appointment:   In Person  Provider:   James Hochrein, MD   Thank you for choosing [facility]!!     

## 2020-10-13 DIAGNOSIS — H31093 Other chorioretinal scars, bilateral: Secondary | ICD-10-CM | POA: Diagnosis not present

## 2020-10-13 DIAGNOSIS — H16213 Exposure keratoconjunctivitis, bilateral: Secondary | ICD-10-CM | POA: Diagnosis not present

## 2020-10-13 DIAGNOSIS — H43811 Vitreous degeneration, right eye: Secondary | ICD-10-CM | POA: Diagnosis not present

## 2020-10-13 DIAGNOSIS — Z961 Presence of intraocular lens: Secondary | ICD-10-CM | POA: Diagnosis not present

## 2020-10-13 DIAGNOSIS — H353131 Nonexudative age-related macular degeneration, bilateral, early dry stage: Secondary | ICD-10-CM | POA: Diagnosis not present

## 2020-10-13 DIAGNOSIS — H04123 Dry eye syndrome of bilateral lacrimal glands: Secondary | ICD-10-CM | POA: Diagnosis not present

## 2020-10-18 ENCOUNTER — Other Ambulatory Visit: Payer: Self-pay | Admitting: Family Medicine

## 2020-10-18 DIAGNOSIS — I480 Paroxysmal atrial fibrillation: Secondary | ICD-10-CM

## 2021-01-16 ENCOUNTER — Encounter: Payer: Self-pay | Admitting: Family Medicine

## 2021-01-16 ENCOUNTER — Other Ambulatory Visit: Payer: Self-pay

## 2021-01-16 ENCOUNTER — Ambulatory Visit (INDEPENDENT_AMBULATORY_CARE_PROVIDER_SITE_OTHER): Payer: PPO | Admitting: Family Medicine

## 2021-01-16 VITALS — BP 112/63 | HR 100 | Ht 66.0 in | Wt 148.0 lb

## 2021-01-16 DIAGNOSIS — I480 Paroxysmal atrial fibrillation: Secondary | ICD-10-CM

## 2021-01-16 DIAGNOSIS — E559 Vitamin D deficiency, unspecified: Secondary | ICD-10-CM

## 2021-01-16 DIAGNOSIS — E039 Hypothyroidism, unspecified: Secondary | ICD-10-CM

## 2021-01-16 DIAGNOSIS — Z1211 Encounter for screening for malignant neoplasm of colon: Secondary | ICD-10-CM | POA: Diagnosis not present

## 2021-01-16 DIAGNOSIS — M810 Age-related osteoporosis without current pathological fracture: Secondary | ICD-10-CM | POA: Diagnosis not present

## 2021-01-16 DIAGNOSIS — E896 Postprocedural adrenocortical (-medullary) hypofunction: Secondary | ICD-10-CM

## 2021-01-16 NOTE — Progress Notes (Signed)
BP 112/63   Pulse 100   Ht 5' 6"  (1.676 m)   Wt 148 lb (67.1 kg)   SpO2 99%   BMI 23.89 kg/m    Subjective:   Patient ID: [name], female    DOB: 03-27-1946, 75 y.o.   MRN: [id number]  HPI: [name] is a 75 y.o. female presenting on 01/16/2021 for Medical Management of Chronic Issues and Hypothyroidism   HPI Hypothyroidism recheck Patient is coming in for thyroid recheck today as well. They deny any issues with hair changes or heat or cold problems or diarrhea or constipation. They deny any chest pain or palpitations. They are currently on levothyroxine 137 micrograms   Osteoporosis recheck.  Patient's last DEXA scan was last year and had improved from previous.  We will continue to monitor  Patient has a history of a right adrenalectomy and wants to get cortisol levels checked, she does this every now and then.  A. fib recheck Patient is coming in for recheck for A. fib.  She sees cardiology and takes flecainide, will do blood work.  Relevant past medical, surgical, family and social history reviewed and updated as indicated. Interim medical history since our last visit reviewed. Allergies and medications reviewed and updated.  Review of Systems  Constitutional: Negative for chills and fever.  Eyes: Negative for visual disturbance.  Respiratory: Negative for chest tightness and shortness of breath.   Cardiovascular: Negative for chest pain and leg swelling.  Musculoskeletal: Negative for back pain and gait problem.  Skin: Negative for rash.  Neurological: Negative for dizziness, light-headedness and headaches.  Psychiatric/Behavioral: Negative for agitation and behavioral problems.  All other systems reviewed and are negative.   Per HPI unless specifically indicated above   Allergies as of 01/16/2021      Reactions   Penicillins Anaphylaxis   Actonel [risedronate Sodium]    Nausea and worsened heart burn   Ceclor [cefaclor] Hives   Acetaminophen  Nausea And Vomiting, Other (See Comments)   Headache   Alendronate Sodium Nausea Only, Other (See Comments)   Heartburn, stomach upset   Celecoxib Other (See Comments)   Stomach upset, heart burn   Codeine Nausea And Vomiting, Other (See Comments)   Headache   Ventolin [kdc:albuterol] Palpitations   Rapid heart beat      Medication List       Accurate as of January 16, 2021 11:12 AM. If you have any questions, ask your nurse or doctor.        aspirin 325 MG EC tablet Take 325 mg by mouth daily.   carboxymethylcellul-glycerin 0.5-0.9 % ophthalmic solution Commonly known as: REFRESH OPTIVE Place 1 drop into both eyes in the morning and at bedtime.   Centrum Silver tablet Take 1 tablet by mouth every morning.   PRESERVISION/LUTEIN PO Take 1 capsule by mouth daily.   Fish Oil 1200 MG Caps Take 1 capsule by mouth daily.   flecainide 50 MG tablet Commonly known as: TAMBOCOR TAKE 1 TABLET BY MOUTH TWICE A DAY   levothyroxine 137 MCG tablet Commonly known as: Synthroid Take 1 tablet (137 mcg total) by mouth daily before breakfast.   loratadine 10 MG tablet Commonly known as: CLARITIN Take 10 mg by mouth daily as needed for allergies.   Refresh Lacri-Lube Oint Apply 1 inch to eye at bedtime. Apply to bottom lid   Vitamin D3 50 MCG (2000 UT) Tabs Take 1 capsule by mouth daily.  Objective:   BP 112/63   Pulse 100   Ht 5' 6"  (1.676 m)   Wt 148 lb (67.1 kg)   SpO2 99%   BMI 23.89 kg/m   Wt Readings from Last 3 Encounters:  01/16/21 148 lb (67.1 kg)  08/31/20 149 lb (67.6 kg)  08/29/20 150 lb (68 kg)    Physical Exam Vitals and nursing note reviewed.  Constitutional:      General: She is not in acute distress.    Appearance: She is well-developed. She is not diaphoretic.  Eyes:     Conjunctiva/sclera: Conjunctivae normal.  Cardiovascular:     Rate and Rhythm: Normal rate and regular rhythm.     Heart sounds: Normal heart sounds. No murmur  heard.   Pulmonary:     Effort: Pulmonary effort is normal. No respiratory distress.     Breath sounds: Normal breath sounds. No wheezing.  Musculoskeletal:        General: No tenderness. Normal range of motion.  Skin:    General: Skin is warm and dry.     Findings: No rash.  Neurological:     Mental Status: She is alert and oriented to person, place, and time.     Coordination: Coordination normal.  Psychiatric:        Behavior: Behavior normal.       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 Follow up plan: Return in about 6 months (around 07/19/2021), or if symptoms worsen or fail to improve, for Thyroid and osteoporosis.  Counseling provided for all of the vaccine components Orders Placed This Encounter  Procedures  . CBC with Differential/Platelet  . CMP14+EGFR  . Lipid panel  . TSH  . VITAMIN D 25 Hydroxy (Vit-D Deficiency, Fractures)  . Cologuard  . Cortisol    Caryl Pina, MD [facility] 01/16/2021, 11:12 AM

## 2021-01-23 DIAGNOSIS — Z1211 Encounter for screening for malignant neoplasm of colon: Secondary | ICD-10-CM | POA: Diagnosis not present

## 2021-01-24 ENCOUNTER — Other Ambulatory Visit: Payer: Self-pay

## 2021-01-24 ENCOUNTER — Other Ambulatory Visit: Payer: PPO

## 2021-01-24 DIAGNOSIS — E559 Vitamin D deficiency, unspecified: Secondary | ICD-10-CM

## 2021-01-24 DIAGNOSIS — I480 Paroxysmal atrial fibrillation: Secondary | ICD-10-CM

## 2021-01-24 DIAGNOSIS — E896 Postprocedural adrenocortical (-medullary) hypofunction: Secondary | ICD-10-CM

## 2021-01-24 DIAGNOSIS — E039 Hypothyroidism, unspecified: Secondary | ICD-10-CM | POA: Diagnosis not present

## 2021-01-25 LAB — LIPID PANEL
Chol/HDL Ratio: 1.8 ratio (ref 0.0–4.4)
Cholesterol, Total: 175 mg/dL (ref 100–199)
HDL: 100 mg/dL (ref >39)
LDL Chol Calc (NIH): 66 mg/dL (ref 0–99)
Triglycerides: 40 mg/dL (ref 0–149)
VLDL Cholesterol Cal: 9 mg/dL (ref 5–40)

## 2021-01-25 LAB — CMP14+EGFR
ALT: 17 IU/L (ref 0–32)
AST: 25 IU/L (ref 0–40)
Albumin/Globulin Ratio: 1.8 (ref 1.2–2.2)
Albumin: 4.3 g/dL (ref 3.7–4.7)
Alkaline Phosphatase: 97 IU/L (ref 44–121)
BUN/Creatinine Ratio: 16 (ref 12–28)
BUN: 11 mg/dL (ref 8–27)
Bilirubin Total: 0.6 mg/dL (ref 0.0–1.2)
CO2: 24 mmol/L (ref 20–29)
Calcium: 9.7 mg/dL (ref 8.7–10.3)
Chloride: 102 mmol/L (ref 96–106)
Creatinine, Ser: 0.7 mg/dL (ref 0.57–1.00)
Globulin, Total: 2.4 g/dL (ref 1.5–4.5)
Glucose: 91 mg/dL (ref 65–99)
Potassium: 4.7 mmol/L (ref 3.5–5.2)
Sodium: 140 mmol/L (ref 134–144)
Total Protein: 6.7 g/dL (ref 6.0–8.5)
eGFR: 91 mL/min/{1.73_m2} (ref >59)

## 2021-01-25 LAB — CBC WITH DIFFERENTIAL/PLATELET
Basophils Absolute: 0 10*3/uL (ref 0.0–0.2)
Basos: 1 %
EOS (ABSOLUTE): 0.5 10*3/uL — ABNORMAL HIGH (ref 0.0–0.4)
Eos: 10 %
Hematocrit: 39.4 % (ref 34.0–46.6)
Hemoglobin: 13.4 g/dL (ref 11.1–15.9)
Immature Grans (Abs): 0 10*3/uL (ref 0.0–0.1)
Immature Granulocytes: 0 %
Lymphocytes Absolute: 1.4 10*3/uL (ref 0.7–3.1)
Lymphs: 29 %
MCH: 30.5 pg (ref 26.6–33.0)
MCHC: 34 g/dL (ref 31.5–35.7)
MCV: 90 fL (ref 79–97)
Monocytes Absolute: 0.5 10*3/uL (ref 0.1–0.9)
Monocytes: 11 %
Neutrophils Absolute: 2.3 10*3/uL (ref 1.4–7.0)
Neutrophils: 49 %
Platelets: 165 10*3/uL (ref 150–450)
RBC: 4.39 x10E6/uL (ref 3.77–5.28)
RDW: 12.4 % (ref 11.7–15.4)
WBC: 4.7 10*3/uL (ref 3.4–10.8)

## 2021-01-25 LAB — CORTISOL: Cortisol: 15.2 ug/dL

## 2021-01-25 LAB — TSH: TSH: <0.005 u[IU]/mL — ABNORMAL LOW (ref 0.450–4.500)

## 2021-01-25 LAB — VITAMIN D 25 HYDROXY (VIT D DEFICIENCY, FRACTURES): Vit D, 25-Hydroxy: 42.4 ng/mL (ref 30.0–100.0)

## 2021-01-30 LAB — COLOGUARD: Cologuard: POSITIVE — AB

## 2021-01-31 ENCOUNTER — Telehealth: Payer: Self-pay | Admitting: Family Medicine

## 2021-01-31 DIAGNOSIS — Z1211 Encounter for screening for malignant neoplasm of colon: Secondary | ICD-10-CM

## 2021-01-31 NOTE — Telephone Encounter (Signed)
Patient has positive Cologuard, will need to go see gastroenterology.  I do not know who she is seen for gastroenterology before and cannot find in her chart, please ask who she is seen before so we can get her set up for colonoscopy.

## 2021-02-03 NOTE — Telephone Encounter (Signed)
Patient aware referral placed.

## 2021-03-21 ENCOUNTER — Encounter (INDEPENDENT_AMBULATORY_CARE_PROVIDER_SITE_OTHER): Payer: PPO | Admitting: Ophthalmology

## 2021-04-06 ENCOUNTER — Ambulatory Visit (INDEPENDENT_AMBULATORY_CARE_PROVIDER_SITE_OTHER): Payer: PPO | Admitting: Ophthalmology

## 2021-04-06 ENCOUNTER — Other Ambulatory Visit: Payer: Self-pay

## 2021-04-06 ENCOUNTER — Encounter (INDEPENDENT_AMBULATORY_CARE_PROVIDER_SITE_OTHER): Payer: Self-pay | Admitting: Ophthalmology

## 2021-04-06 DIAGNOSIS — H43813 Vitreous degeneration, bilateral: Secondary | ICD-10-CM | POA: Diagnosis not present

## 2021-04-06 DIAGNOSIS — H348112 Central retinal vein occlusion, right eye, stable: Secondary | ICD-10-CM | POA: Diagnosis not present

## 2021-04-06 DIAGNOSIS — H35062 Retinal vasculitis, left eye: Secondary | ICD-10-CM

## 2021-04-06 NOTE — Progress Notes (Signed)
04/06/2021     CHIEF COMPLAINT Patient presents for Retina Follow Up (1 Year F/U OU//Pt denies noticeable changes to [place] OU since last visit. Pt denies ocular pain, flashes of light, or floaters OU. //)   HISTORY OF PRESENT ILLNESS: [name] is a 75 y.o. female who presents to the clinic today for:   HPI    Retina Follow Up    Diagnosis: CRVO/BRVO   Laterality: right eye   Onset: 1 year ago   Severity: mild   Duration: 1 year   Course: stable   Comments: 1 Year F/U OU  Pt denies noticeable changes to [place] OU since last visit. Pt denies ocular pain, flashes of light, or floaters OU.          Last edited by Rockie Neighbours, [place] on 04/06/2021  1:26 PM. (History)      Referring physician: Dettinger, Fransisca Kaufmann, MD [address],  [place] [phone]  HISTORICAL INFORMATION:   Selected notes from the MEDICAL RECORD NUMBER       CURRENT MEDICATIONS: Current Outpatient Medications (Ophthalmic Drugs)  Medication Sig  . Artificial Tear Ointment (REFRESH LACRI-LUBE) OINT Apply 1 inch to eye at bedtime. Apply to bottom lid  . carboxymethylcellul-glycerin (REFRESH OPTIVE) 0.5-0.9 % ophthalmic solution Place 1 drop into both eyes in the morning and at bedtime.   No current facility-administered medications for this visit. (Ophthalmic Drugs)   Current Outpatient Medications (Other)  Medication Sig  . aspirin 325 MG EC tablet Take 325 mg by mouth daily.  . Cholecalciferol (VITAMIN D3) 2000 UNITS TABS Take 1 capsule by mouth daily.  . flecainide (TAMBOCOR) 50 MG tablet TAKE 1 TABLET BY MOUTH TWICE A DAY  . levothyroxine (SYNTHROID) 137 MCG tablet Take 1 tablet (137 mcg total) by mouth daily before breakfast.  . loratadine (CLARITIN) 10 MG tablet Take 10 mg by mouth daily as needed for allergies.   . Multiple Vitamins-Minerals (CENTRUM SILVER) tablet Take 1 tablet by mouth every morning.  . Multiple Vitamins-Minerals (PRESERVISION/LUTEIN PO) Take 1 capsule by mouth daily.  .  Omega-3 Fatty Acids (FISH OIL) 1200 MG CAPS Take 1 capsule by mouth daily.   No current facility-administered medications for this visit. (Other)      REVIEW OF SYSTEMS:    ALLERGIES Allergies  Allergen Reactions  . Penicillins Anaphylaxis  . Actonel [Risedronate Sodium]     Nausea and worsened heart burn  . Ceclor [Cefaclor] Hives  . Acetaminophen Nausea And Vomiting and Other (See Comments)    Headache  . Alendronate Sodium Nausea Only and Other (See Comments)    Heartburn, stomach upset  . Celecoxib Other (See Comments)    Stomach upset, heart burn  . Codeine Nausea And Vomiting and Other (See Comments)    Headache  . Ventolin [Kdc:Albuterol] Palpitations    Rapid heart beat    PAST MEDICAL HISTORY Past Medical History:  Diagnosis Date  . Atrial fibrillation (HCC)    PT ON FLECANIDE -DID NOT WANT TO BE ON BLOOD THINNERS OTHER THAN DAILY ASA  . Blurry vision, right eye    HX OF RT EYE VISION DISTURBANCE 01/2012 -PT SEEN BY DR. Shelie Lansing--ELEVATED B/P  THOUGHT TO BE RELATED TO [place] SPOTTED FEVER THOUGHT TO BE CAUSE OF VISION PROBLEM-HAD LASER OF BOTH EYES (SLIGHT DAMAGE TO LEFT  EYE).    . Cataract    BILATERAL  . GERD (gastroesophageal reflux disease)   . Headache(784.0)    PAST HX  MIGRAINES  . Hearing loss of right ear   . Hypertension    B/P HAS BEEN UP AND DOWN BUT MOSTLY DOWN WHILE ON METOPROLOL -WAS TAKEN OFF METOROLOL IN NOV 2012 BY CARDIOLOGIST AND THEN IN MARCH 2013 PT'S B/P BECAME REALLY ELEVATED-SHE RESTARTED METOPROLOL  AND WAS GIVEN BENICAR.  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 Hypothyroidism   . Mitral valve prolapse    per pt report, not listed on echo  . MR (mitral regurgitation)    none on echo in 2013  . Osteoarthritis    LITTLE FINGER LEFT HAND  . Palpitations JUNE 2012   piror to diagnosis of SVT  -NO PROBLMS WITH REOCCURANCE  . Pheochromocytoma   . PONV (postoperative nausea and vomiting)     HX OF N&V AFTER SURGERIES-EXCEPT NO PROBLEM AFTER WRIST SURGERY IN 2012-AT [facility]  . Thyroid cancer [facility])    age 73; s/p resection with subsequent hypothyroidism. 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 TMJ locking    PT STATES IF MOUTH OPEN EXTENDED TIME-LIKE AT DENTIST--HER JAWS LOCK  . TR (tricuspid regurgitation)   . Vegetarian diet    DOES EAT EGGS AND DAIRY--NO MEATS OR FISH   Past Surgical History:  Procedure Laterality Date  . ADRENALECTOMY  07/03/2012   Procedure: ADRENALECTOMY;  Surgeon: Dutch Gray, MD;  Location: [facility];  Service: Urology;  Laterality: Right;  . APPENDECTOMY    . Lung biopsies    . THYROIDECTOMY    . WRIST SURGERY      FAMILY HISTORY Family History  Problem Relation Age of Onset  . Kidney disease Mother   . Cancer Sister        lymphoma    SOCIAL HISTORY Social History   Tobacco Use  . Smoking status: Never Smoker  . Smokeless tobacco: Never Used  . Tobacco comment: no tobacco   Vaping Use  . Vaping Use: Never used  Substance Use Topics  . Alcohol use: No  . Drug use: No         OPHTHALMIC EXAM: Base Eye Exam    Visual Acuity (ETDRS)      Right Left   Dist [place] 20/25 +2 20/20       Tonometry (Tonopen, 1:29 PM)      Right Left   Pressure 12 13       Pupils      Pupils Dark Light Shape React APD   Right PERRL 5 4 Round Brisk None   Left PERRL 5 4 Round Brisk None       Visual Fields (Counting fingers)      Left Right    Full Full       Extraocular Movement      Right Left    Full Full       Neuro/Psych    Oriented x3: Yes   Mood/Affect: Normal       Dilation    Both eyes: 1.0% Mydriacyl, 2.5% Phenylephrine @ 1:29 PM        Slit Lamp and Fundus Exam    External Exam      Right Left   External Normal Normal       Slit Lamp Exam      Right Left   Lids/Lashes Normal Normal   Conjunctiva/Sclera White and quiet White and quiet   Cornea Clear Clear   Anterior Chamber Deep and quiet Deep and  quiet   Iris Round and reactive Round and reactive   Lens Posterior chamber intraocular lens Posterior chamber intraocular lens   Anterior Vitreous Normal Normal       Fundus Exam      Right Left   Posterior Vitreous Normal Normal   Disc Normal Normal   C/D Ratio 0.35 0.35   Macula Normal Normal   Vessels Normal Normal   Periphery Mild scatter photocoagulation nasally. Mild scatter photocoagulation nasally.          IMAGING AND PROCEDURES  Imaging and Procedures for 04/06/21  OCT, Retina - OU - Both Eyes       Right Eye Quality was good. Scan locations included subfoveal. Central Foveal Thickness: 23.   Left Eye Quality was good. Scan locations included subfoveal. Central Foveal Thickness: 254. Findings include normal foveal contour.   Notes No active maculopathy incidental posterior vitreous detachment OU                ASSESSMENT/PLAN:  Retinal vasculitis of left eye Condition resolved.  Local treatment areas of nonperfusion performed in 2013  Central retinal vein occlusion of right eye Not active stable      ICD-10-CM   1. Central retinal vein occlusion of right eye, unspecified complication status  F02.7741 OCT, Retina - OU - Both Eyes  2. Posterior vitreous detachment of both eyes  H43.813 OCT, Retina - OU - Both Eyes  3. Retinal vasculitis of left eye  H35.062     1.  OU, 9 years after angiographic events of retinal vasculitis which was subsequently found to be secondary to systemic [facility] spotted fever.  Condition resolved and stabilized once his systemic illness was treated and cleared.  2.  No ongoing signs of progressive disease.  3.  Late OU next, made to perform wide-field angiography next to look for signs of extensive retinal nonperfusion which may be lingering as a consequence of prior disease  Ophthalmic Meds Ordered this visit:  No orders of the defined types were placed in this encounter.      Return in about 1 year  (around 04/06/2022) for DILATE OU, COLOR FP.  There are no Patient Instructions on file for this visit.   Explained the diagnoses, plan, and follow up with the patient and they expressed understanding.  Patient expressed understanding of the importance of proper follow up care.   Clent Demark Tarika Mckethan M.D. Diseases & Surgery of the Retina and Vitreous Retina & Diabetic [place] 04/06/21     Abbreviations: M myopia (nearsighted); A astigmatism; H hyperopia (farsighted); P presbyopia; Mrx spectacle prescription;  CTL contact lenses; OD right eye; OS left eye; OU both eyes  XT exotropia; ET esotropia; PEK punctate epithelial keratitis; PEE punctate epithelial erosions; DES dry eye syndrome; MGD meibomian gland dysfunction; ATs artificial tears; PFAT's preservative free artificial tears; [place] nuclear sclerotic cataract; PSC posterior subcapsular cataract; ERM epi-retinal membrane; PVD posterior vitreous detachment; RD retinal detachment; DM diabetes mellitus; DR diabetic retinopathy; NPDR non-proliferative diabetic retinopathy; PDR proliferative diabetic retinopathy; CSME clinically significant macular edema; DME diabetic macular edema; dbh dot blot hemorrhages; CWS cotton wool spot; POAG primary open angle glaucoma; C/D cup-to-disc ratio; HVF humphrey visual field; GVF goldmann visual field; OCT optical coherence tomography; IOP intraocular pressure; BRVO Branch retinal vein occlusion; CRVO central retinal vein occlusion; CRAO central retinal artery occlusion; BRAO branch retinal artery occlusion; RT retinal tear; SB scleral buckle; PPV pars plana vitrectomy; VH Vitreous hemorrhage; PRP panretinal laser photocoagulation; IVK intravitreal  kenalog; VMT vitreomacular traction; MH Macular hole;  NVD neovascularization of the disc; NVE neovascularization elsewhere; AREDS age related eye disease study; ARMD age related macular degeneration; POAG primary open angle glaucoma; EBMD epithelial/anterior basement membrane  dystrophy; ACIOL anterior chamber intraocular lens; IOL intraocular lens; PCIOL posterior chamber intraocular lens; Phaco/IOL phacoemulsification with intraocular lens placement; [place] photorefractive keratectomy; LASIK laser assisted in situ keratomileusis; HTN hypertension; DM diabetes mellitus; COPD chronic obstructive pulmonary disease

## 2021-04-06 NOTE — Assessment & Plan Note (Signed)
Not active stable

## 2021-04-06 NOTE — Assessment & Plan Note (Signed)
Condition resolved.  Local treatment areas of nonperfusion performed in 2013

## 2021-04-11 ENCOUNTER — Other Ambulatory Visit: Payer: Self-pay | Admitting: Family Medicine

## 2021-04-11 DIAGNOSIS — I480 Paroxysmal atrial fibrillation: Secondary | ICD-10-CM

## 2021-04-12 ENCOUNTER — Ambulatory Visit (AMBULATORY_SURGERY_CENTER): Payer: PPO | Admitting: *Deleted

## 2021-04-12 ENCOUNTER — Other Ambulatory Visit: Payer: Self-pay

## 2021-04-12 VITALS — Ht 66.0 in | Wt 145.0 lb

## 2021-04-12 DIAGNOSIS — R195 Other fecal abnormalities: Secondary | ICD-10-CM

## 2021-04-12 MED ORDER — SUPREP BOWEL PREP KIT 17.5-3.13-1.6 GM/177ML PO SOLN: 1.0000 | Freq: Once | ORAL | 0 refills | Status: AC

## 2021-04-12 NOTE — Progress Notes (Signed)
Pt verified name, DOB, address and insurance during PV today. Pt mailed instruction packet to included paper to complete and mail back to [facility] with addressed and stamped envelope, Emmi video, copy of consent form to read and not return, and instructions. PV completed over the phone. Pt encouraged to call with questions or issues.  My Chart instructions to pt as well    No egg or soy allergy known to patient  No issues with past sedation with any surgeries or procedures Patient denies ever being told they had issues or difficulty with intubation  No FH of Malignant Hyperthermia No diet pills per patient No home 02 use per patient  No blood thinners per patient  Pt denies issues with constipation  No A fib currently- past hx - on Flecanide   EMMI video to pt or via [place] 19 guidelines implemented in [place] today with Pt and RN  Pt is fully vaccinated  for Covid   Due to the COVID-19 pandemic we are asking patients to follow certain guidelines.  Pt aware of COVID protocols and [facility] guidelines

## 2021-04-25 ENCOUNTER — Encounter: Payer: Self-pay | Admitting: Certified Registered Nurse Anesthetist

## 2021-04-26 ENCOUNTER — Other Ambulatory Visit: Payer: Self-pay | Admitting: Gastroenterology

## 2021-04-26 ENCOUNTER — Encounter: Payer: Self-pay | Admitting: Gastroenterology

## 2021-04-26 ENCOUNTER — Ambulatory Visit (AMBULATORY_SURGERY_CENTER): Payer: PPO | Admitting: Gastroenterology

## 2021-04-26 ENCOUNTER — Other Ambulatory Visit: Payer: Self-pay

## 2021-04-26 VITALS — BP 110/56 | HR 79 | Temp 97.0°F | Resp 11 | Ht 66.0 in | Wt 145.0 lb

## 2021-04-26 DIAGNOSIS — D122 Benign neoplasm of ascending colon: Secondary | ICD-10-CM

## 2021-04-26 DIAGNOSIS — D123 Benign neoplasm of transverse colon: Secondary | ICD-10-CM | POA: Diagnosis not present

## 2021-04-26 DIAGNOSIS — D120 Benign neoplasm of cecum: Secondary | ICD-10-CM

## 2021-04-26 DIAGNOSIS — Z1211 Encounter for screening for malignant neoplasm of colon: Secondary | ICD-10-CM | POA: Diagnosis not present

## 2021-04-26 DIAGNOSIS — K648 Other hemorrhoids: Secondary | ICD-10-CM

## 2021-04-26 DIAGNOSIS — R195 Other fecal abnormalities: Secondary | ICD-10-CM

## 2021-04-26 DIAGNOSIS — I10 Essential (primary) hypertension: Secondary | ICD-10-CM | POA: Diagnosis not present

## 2021-04-26 MED ORDER — SODIUM CHLORIDE 0.9 % IV SOLN: 500.0000 mL | INTRAVENOUS | Status: DC

## 2021-04-26 NOTE — Patient Instructions (Signed)
Discharge instructions given. Handouts on polyps and hemorrhoids. Resume previous medications. YOU HAD AN ENDOSCOPIC PROCEDURE TODAY AT THE [facility]:   Refer to the procedure report that was given to you for any specific questions about what was found during the examination.  If the procedure report does not answer your questions, please call your gastroenterologist to clarify.  If you requested that your care partner not be given the details of your procedure findings, then the procedure report has been included in a sealed envelope for you to review at your convenience later.  YOU SHOULD EXPECT: Some feelings of bloating in the abdomen. Passage of more gas than usual.  Walking can help get rid of the air that was put into your GI tract during the procedure and reduce the bloating. If you had a lower endoscopy (such as a colonoscopy or flexible sigmoidoscopy) you may notice spotting of blood in your stool or on the toilet paper. If you underwent a bowel prep for your procedure, you may not have a normal bowel movement for a few days.  Please Note:  You might notice some irritation and congestion in your nose or some drainage.  This is from the oxygen used during your procedure.  There is no need for concern and it should clear up in a day or so.  SYMPTOMS TO REPORT IMMEDIATELY:  Following lower endoscopy (colonoscopy or flexible sigmoidoscopy):  Excessive amounts of blood in the stool  Significant tenderness or worsening of abdominal pains  Swelling of the abdomen that is new, acute  Fever of 100F or higher   For urgent or emergent issues, a gastroenterologist can be reached at any hour by calling [phone]. Do not use MyChart messaging for urgent concerns.    DIET:  We do recommend a small meal at first, but then you may proceed to your regular diet.  Drink plenty of fluids but you should avoid alcoholic beverages for 24 hours.  ACTIVITY:  You should plan to take it  easy for the rest of today and you should NOT DRIVE or use heavy machinery until tomorrow (because of the sedation medicines used during the test).    FOLLOW UP: Our staff will call the number listed on your records 48-72 hours following your procedure to check on you and address any questions or concerns that you may have regarding the information given to you following your procedure. If we do not reach you, we will leave a message.  We will attempt to reach you two times.  During this call, we will ask if you have developed any symptoms of COVID 19. If you develop any symptoms (ie: fever, flu-like symptoms, shortness of breath, cough etc.) before then, please call [phone].  If you test positive for Covid 19 in the 2 weeks post procedure, please call and report this information to us.    If any biopsies were taken you will be contacted by phone or by letter within the next 1-3 weeks.  Please call us at [phone] if you have not heard about the biopsies in 3 weeks.    SIGNATURES/CONFIDENTIALITY: You and/or your care partner have signed paperwork which will be entered into your electronic medical record.  These signatures attest to the fact that that the information above on your After Visit Summary has been reviewed and is understood.  Full responsibility of the confidentiality of this discharge information lies with you and/or your care-partner.  

## 2021-04-26 NOTE — Op Note (Signed)
[place] Patient Name: [name] Procedure Date: 04/26/2021 8:34 AM MRN: [id number] Endoscopist: Mauri Pole , MD Age: 75 Referring MD:  Date of Birth: Mar 18, 1946 Gender: Female Account #: [id number] Procedure:                Colonoscopy Indications:              Positive Cologuard test Medicines:                Monitored Anesthesia Care Procedure:                Pre-Anesthesia Assessment:                           - Prior to the procedure, a History and Physical                            was performed, and patient medications and                            allergies were reviewed. The patient's tolerance of                            previous anesthesia was also reviewed. The risks                            and benefits of the procedure and the sedation                            options and risks were discussed with the patient.                            All questions were answered, and informed consent                            was obtained. Prior Anticoagulants: The patient has                            taken no previous anticoagulant or antiplatelet                            agents. ASA Grade Assessment: II - A patient with                            mild systemic disease. After reviewing the risks                            and benefits, the patient was deemed in                            satisfactory condition to undergo the procedure.                           After obtaining informed consent, the colonoscope  was passed under direct vision. Throughout the                            procedure, the patient's blood pressure, pulse, and                            oxygen saturations were monitored continuously. The                            Olympus PCF-H190DL (#[id number]) Colonoscope was                            introduced through the anus and advanced to the the                            cecum, identified by appendiceal  orifice and                            ileocecal valve. The colonoscopy was performed                            without difficulty. The patient tolerated the                            procedure well. The quality of the bowel                            preparation was excellent. The ileocecal valve,                            appendiceal orifice, and rectum were photographed. Scope In: 8:49:27 AM Scope Out: 9:03:20 AM Scope Withdrawal Time: 0 hours 10 minutes 21 seconds  Total Procedure Duration: 0 hours 13 minutes 53 seconds  Findings:                 The perianal and digital rectal examinations were                            normal.                           A 14 mm polyp was found in the cecum. The polyp was                            multi-lobulated and semi-pedunculated with mild                            oozing of heme. The polyp was removed with a hot                            snare. Resection and retrieval were complete.                           Two sessile polyps were found in the transverse  colon and cecum. The polyps were 5 to 7 mm in size.                            These polyps were removed with a cold snare.                            Resection and retrieval were complete.                           A 2 mm polyp was found in the ascending colon. The                            polyp was sessile. The polyp was removed with a                            cold biopsy forceps. Resection and retrieval were                            complete.                           Non-bleeding external and internal hemorrhoids were                            found during retroflexion. The hemorrhoids were                            medium-sized. Complications:            No immediate complications. Estimated Blood Loss:     Estimated blood loss was minimal. Impression:               - One 14 mm polyp in the cecum, removed with a hot                             snare. Resected and retrieved.                           - Two 5 to 7 mm polyps in the transverse colon and                            in the cecum, removed with a cold snare. Resected                            and retrieved.                           - One 2 mm polyp in the ascending colon, removed                            with a cold biopsy forceps. Resected and retrieved.                           - Non-bleeding external and internal hemorrhoids. Recommendation:           -  Patient has a contact number available for                            emergencies. The signs and symptoms of potential                            delayed complications were discussed with the                            patient. Return to normal activities tomorrow.                            Written discharge instructions were provided to the                            patient.                           - Resume previous diet.                           - Continue present medications.                           - Await pathology results.                           - Repeat colonoscopy in 3 years for surveillance                            based on pathology results. Mauri Pole, MD 04/26/2021 9:11:12 AM This report has been signed electronically.

## 2021-04-26 NOTE — Progress Notes (Signed)
Vs by CW; previsit done over phone.  No changes to health since previsit

## 2021-04-26 NOTE — Progress Notes (Signed)
Called to room to assist during endoscopic procedure.  Patient ID and intended procedure confirmed with present staff. Received instructions for my participation in the procedure from the performing physician.  

## 2021-04-28 ENCOUNTER — Telehealth: Payer: Self-pay

## 2021-04-28 NOTE — Telephone Encounter (Signed)
  Follow up Call-  Call back number 04/26/2021  Post procedure Call Back phone  # [phone]  Permission to leave phone message Yes  Some recent data might be hidden     2nd follow up call made. NALM

## 2021-04-28 NOTE — Telephone Encounter (Signed)
First attempt follow up call to pt, no answer. 

## 2021-05-08 ENCOUNTER — Encounter: Payer: Self-pay | Admitting: Gastroenterology

## 2021-05-17 ENCOUNTER — Telehealth: Payer: Self-pay | Admitting: Gastroenterology

## 2021-05-17 NOTE — Telephone Encounter (Signed)
Spoke with the patient. Advised of her results from the pathology. She understands she will receive a letter from the doctor about her recall colonoscopy.

## 2021-05-17 NOTE — Telephone Encounter (Signed)
Inbound call from patient inquiring about results from procedure 6/22. Best contact number [phone]

## 2021-07-19 ENCOUNTER — Ambulatory Visit (INDEPENDENT_AMBULATORY_CARE_PROVIDER_SITE_OTHER): Payer: PPO

## 2021-07-19 VITALS — Ht 66.0 in | Wt 145.0 lb

## 2021-07-19 DIAGNOSIS — Z0000 Encounter for general adult medical examination without abnormal findings: Secondary | ICD-10-CM | POA: Diagnosis not present

## 2021-07-19 NOTE — Progress Notes (Signed)
Subjective:   [name] is a 75 y.o. female who presents for Medicare Annual (Subsequent) preventive examination.  Virtual Visit via Telephone Note  I connected with  [name] on 07/19/21 at  2:45 PM EDT by telephone and verified that I am speaking with the correct person using two identifiers.  Location: Patient: Home Provider: [facility] Persons participating in the virtual visit: patient/Nurse Health Advisor   I discussed the limitations, risks, security and privacy concerns of performing an evaluation and management service by telephone and the availability of in person appointments. The patient expressed understanding and agreed to proceed.  Interactive audio and video telecommunications were attempted between this nurse and patient, however failed, due to patient having technical difficulties OR patient did not have access to video capability.  We continued and completed visit with audio only.  Some vital signs may be absent or patient reported.   [name] E Shelby Chrystal, LPN   Review of Systems     Cardiac Risk Factors include: advanced age (>59mn, >>12women);Other (see comment);sedentary lifestyle, Risk factor comments: Atrial Fibrillation     Objective:    Today's Vitals   07/19/21 1430  Weight: 145 lb (65.8 kg)  Height: '5\' 6"'$  (1.676 m)   Body mass index is 23.4 kg/m.  Advanced Directives 07/19/2021 02/03/2020 07/04/2012 06/27/2012  Does Patient Have a Medical Advance Directive? No No Patient does not have advance directive;Patient would not like information Patient does not have advance directive;Patient would not like information  Would patient like information on creating a medical advance directive? No - Patient declined No - Patient declined - -    Current Medications (verified) Outpatient Encounter Medications as of 07/19/2021  Medication Sig   Artificial Tear Ointment (REFRESH LACRI-LUBE) OINT Apply 1 inch to eye at bedtime. Apply to bottom lid   aspirin 325  MG EC tablet Take 325 mg by mouth daily.   carboxymethylcellul-glycerin (REFRESH OPTIVE) 0.5-0.9 % ophthalmic solution Place 1 drop into both eyes in the morning and at bedtime.   Cholecalciferol (VITAMIN D3) 2000 UNITS TABS Take 1 capsule by mouth daily.   flecainide (TAMBOCOR) 50 MG tablet TAKE 1 TABLET BY MOUTH TWICE A DAY   levothyroxine (SYNTHROID) 137 MCG tablet Take 1 tablet (137 mcg total) by mouth daily before breakfast.   loratadine (CLARITIN) 10 MG tablet Take 10 mg by mouth daily as needed for allergies.    Multiple Vitamins-Minerals (CENTRUM SILVER) tablet Take 1 tablet by mouth every morning.   Multiple Vitamins-Minerals (PRESERVISION/LUTEIN PO) Take 1 capsule by mouth daily.   Omega-3 Fatty Acids (FISH OIL) 1200 MG CAPS Take 1 capsule by mouth daily.   No facility-administered encounter medications on file as of 07/19/2021.    Allergies (verified) Penicillins, Actonel [risedronate sodium], Ceclor [cefaclor], Acetaminophen, Alendronate sodium, Celecoxib, Codeine, and Ventolin [kdc:albuterol]   History: Past Medical History:  Diagnosis Date   Allergy    Atrial fibrillation (HCC)    PT ON FLECANIDE -DID NOT WANT TO BE ON BLOOD THINNERS OTHER THAN DAILY ASA- due to adrenal gland issue - no issues since corrected    Blood transfusion without reported diagnosis    Blurry vision, right eye    HX OF RT EYE VISION DISTURBANCE 01/2012 -PT SEEN BY DR. [facility]ANKIN--ELEVATED B/P  THOUGHT TO BE RELATED TO [place] SPOTTED FEVER THOUGHT TO BE CAUSE OF VISION PROBLEM-HAD LASER OF BOTH EYES (SLIGHT DAMAGE TO LEFT  EYE).     Cataract    BILATERAL removed  GERD (gastroesophageal reflux disease)    Headache(784.0)    PAST HX MIGRAINES   Hearing loss of right ear    Heart murmur    TR    Hypertension    B/P HAS BEEN UP AND DOWN BUT MOSTLY DOWN WHILE ON METOPROLOL -WAS TAKEN OFF METOROLOL IN NOV 2012 BY CARDIOLOGIST AND THEN IN MARCH 2013 PT'S B/P BECAME REALLY ELEVATED-SHE RESTARTED  METOPROLOL  AND WAS GIVEN BENICAR.  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 hx of mets to luns, which was treated (unsure of how)   TMJ locking    PT STATES IF MOUTH OPEN EXTENDED TIME-LIKE AT DENTIST--HER JAWS LOCK   TR (tricuspid regurgitation)    Vegetarian diet    DOES EAT EGGS AND DAIRY--NO MEATS OR FISH   Past Surgical History:  Procedure Laterality Date   ADRENALECTOMY  07/03/2012   Procedure: ADRENALECTOMY;  Surgeon: Dutch Gray, MD;  Location: [facility];  Service: Urology;  Laterality: Right;   APPENDECTOMY     Lung biopsies     THYROIDECTOMY     UPPER GASTROINTESTINAL ENDOSCOPY     WRIST SURGERY     Family History  Problem Relation Age of Onset   Kidney disease Mother    Colon polyps Father    Cancer Sister        lymphoma   Colon cancer Neg Hx    Esophageal cancer Neg Hx    Rectal cancer Neg Hx    Stomach cancer Neg Hx    Social History   Socioeconomic History   Marital status: Married    Spouse name: Not on file   Number of children: 1   Years of education: Not on file   Highest education level: Not on file  Occupational History   Occupation: retired  Tobacco Use   Smoking status: Never   Smokeless tobacco: Never   Tobacco comments:    no tobacco   Vaping Use   Vaping Use: Never used  Substance and Sexual Activity   Alcohol use: No   Drug use: No   Sexual activity: Not on file  Other Topics Concern   Not on file  Social History Narrative    Lives in [place] with husband. Used to work full time as a Clinical cytogeneticist, now works one day/week. No herbal meds. Vegetarian; no caffeine Does not exercise regularly but is very active and denies any exertional symptoms.    Daughter lives close by   Social Determinants of Health   Financial Resource Strain: Low Risk    Difficulty of Paying Living Expenses: Not hard at all  Food Insecurity: No Food Insecurity   Worried About Charity fundraiser in the Last Year: Never true   Arboriculturist in the Last Year: Never true  Transportation Needs: No Transportation Needs   Lack of Transportation (Medical): No  Lack of Transportation (Non-Medical): No  Physical Activity: Insufficiently Active   Days of Exercise per Week: 7 days   Minutes of Exercise per Session: 20 min  Stress: No Stress Concern Present   Feeling of Stress : Not at all  Social Connections: Socially Integrated   Frequency of Communication with Friends and Family: More than three times a week   Frequency of Social Gatherings with Friends and Family: More than three times a week   Attends Religious Services: More than 4 times per year   Active Member of [employer] or Organizations: Yes   Attends Music therapist: More than 4 times per year   Marital Status: Married    Tobacco Counseling Counseling given: Not Answered Tobacco comments: no tobacco    Clinical Intake:  Pre-visit preparation completed: Yes  Pain : No/denies pain     BMI - recorded: 23.4 Nutritional Status: BMI of 19-24  Normal Nutritional Risks: None Diabetes: No  How often do you need to have someone help you when you read instructions, pamphlets, or other written materials from your doctor or pharmacy?: 1 - Never  Diabetic? No  Interpreter Needed?: No  Information entered by :: Yolanda Dockendorf, LPN   Activities of Daily Living In your present state of health, do you have any difficulty performing the following activities: 07/19/2021   Hearing? N  Vision? N  Difficulty concentrating or making decisions? N  Walking or climbing stairs? N  Dressing or bathing? N  Doing errands, shopping? N  Preparing Food and eating ? N  Using the Toilet? N  In the past six months, have you accidently leaked urine? N  Do you have problems with loss of bowel control? N  Managing your Medications? N  Managing your Finances? N  Housekeeping or managing your Housekeeping? N  Some recent data might be hidden    Patient Care Team: Dettinger, Fransisca Kaufmann, MD as PCP - General (Family Medicine) Minus Breeding, MD (Cardiology) Zadie Rhine Clent Demark, MD (Ophthalmology) Clent Jacks, MD (Ophthalmology)  Indicate any recent Medical Services you may have received from other than [facility] providers in the past year (date may be approximate).     Assessment:   This is a routine wellness examination for Winifred.  Hearing/Vision screen Hearing Screening - Comments:: Denies hearing difficulties  Vision Screening - Comments:: Wears glasses for reading only - up to date with annual eye exams with Dr Katy Fitch 10/2020 and [facility] 04/2021  Dietary issues and exercise activities discussed: Current Exercise Habits: Home exercise routine, Time (Minutes): 20, Frequency (Times/Week): 7, Weekly Exercise (Minutes/Week): 140, Intensity: Mild, Exercise limited by: cardiac condition(s)   Goals Addressed             This Visit's Progress    Exercise 150 minutes per week (moderate activity)   On track      Depression Screen PHQ 2/9 Scores 07/19/2021 01/16/2021 08/29/2020 07/19/2020 02/03/2020 01/14/2020 08/20/2019  PHQ - 2 Score 0 0 0 0 0 0 0    Fall Risk Fall Risk  07/19/2021 01/16/2021 08/29/2020 07/19/2020 02/03/2020  Falls in the past year? 0 0 0 0 0  Number falls in past yr: 0 - - - -  Injury with Fall? 0 - - - -  Follow up Falls prevention discussed - - - -    FALL RISK PREVENTION PERTAINING TO THE HOME:  Any stairs in or around the home? Yes  If so, are there  any without handrails? No  Home free of  loose throw rugs in walkways, pet beds, electrical cords, etc? Yes  Adequate lighting in your home to reduce risk of falls? Yes   ASSISTIVE DEVICES UTILIZED TO PREVENT FALLS:  Life alert? No  Use of a cane, Graddy or w/c? No  Grab bars in the bathroom? No  Shower chair or bench in shower? No  Elevated toilet seat or a handicapped toilet? No   TIMED UP AND GO:  Was the test performed? No . Telephonic visit  Cognitive Function: Normal cognitive status assessed by direct observation by this Nurse Health Advisor. No abnormalities found.    MMSE - Mini Mental State Exam 08/22/2017  Orientation to time 5  Orientation to Place 5  Registration 3  Attention/ Calculation 5  Recall 3  Language- name 2 objects 2  Language- repeat 1  Language- follow 3 step command 3  Language- read & follow direction 1  Write a sentence 1  Copy design 0  Total score 29     6CIT Screen 02/03/2020  What Year? 0 points  What month? 0 points  What time? 0 points  Count back from 20 0 points  Months in reverse 0 points  Repeat phrase 0 points  Total Score 0    Immunizations Immunization History  Administered Date(s) Administered   Fluad Quad(high Dose 65+) 08/20/2019, 08/30/2020   Influenza Whole 08/05/2010   Influenza, High Dose Seasonal PF 08/13/2016, 08/26/2017, 09/01/2018   Influenza,inj,Quad PF,6+ Mos 08/19/2013, 08/19/2014, 08/11/2015   Moderna Sars-Covid-2 Vaccination 12/17/2019, 01/15/2020, 10/03/2020   Pneumococcal Conjugate-13 10/21/2013   Pneumococcal Polysaccharide-23 11/05/1994, 08/27/2011   Td 11/05/2006   Tdap 01/11/2017    TDAP status: Up to date  Flu Vaccine status: Due, Education has been provided regarding the importance of this vaccine. Advised may receive this vaccine at local pharmacy or Health Dept. Aware to provide a copy of the vaccination record if obtained from local pharmacy or Health Dept. Verbalized acceptance and  understanding.  Pneumococcal vaccine status: Up to date  Covid-19 vaccine status: Completed vaccines  Qualifies for Shingles Vaccine? Yes   Zostavax completed No   Shingrix Completed?: No.    Education has been provided regarding the importance of this vaccine. Patient has been advised to call insurance company to determine out of pocket expense if they have not yet received this vaccine. Advised may also receive vaccine at local pharmacy or Health Dept. Verbalized acceptance and understanding.  Screening Tests Health Maintenance  Topic Date Due   Zoster Vaccines- Shingrix (1 of 2) Never done   COVID-19 Vaccine (4 - Booster for Moderna series) 12/26/2020   INFLUENZA VACCINE  06/05/2021   MAMMOGRAM  06/28/2021   PAP SMEAR-Modifier  08/29/2022   DEXA SCAN  08/29/2022   COLONOSCOPY (Pts 45-10yr Insurance coverage will need to be confirmed)  04/26/2024   TETANUS/TDAP  01/12/2027   Hepatitis C Screening  Completed   PNA vac Low Risk Adult  Completed   HPV VACCINES  Aged Out   COLON CANCER SCREENING ANNUAL FOBT  Discontinued    Health Maintenance  Health Maintenance Due  Topic Date Due   Zoster Vaccines- Shingrix (1 of 2) Never done   COVID-19 Vaccine (4 - Booster for Moderna series) 12/26/2020   INFLUENZA VACCINE  06/05/2021   MAMMOGRAM  06/28/2021    Colorectal cancer screening: Type of screening: Colonoscopy. Completed 6/22/. Repeat every 3 years  Mammogram status: Ordered 06/2021. Pt provided with contact info and advised to call to schedule appt.  Has appt  09/10/2021  Bone Density status: Completed 08/29/2020. Results reflect: Bone density results: OSTEOPOROSIS. Repeat every 2 years.  Lung Cancer Screening: (Low Dose CT Chest recommended if Age 28-80 years, 30 pack-year currently smoking OR have quit w/in 15years.) does not qualify.   Additional Screening:  Hepatitis C Screening: does qualify; Completed 08/20/2019  Vision Screening: Recommended annual ophthalmology  exams for early detection of glaucoma and other disorders of the eye. Is the patient up to date with their annual eye exam?  Yes  Who is the provider or what is the name of the office in which the patient attends annual eye exams? [facility] If pt is not established with a provider, would they like to be referred to a provider to establish care? No .   Dental Screening: Recommended annual dental exams for proper oral hygiene  Community Resource Referral / Chronic Care Management: CRR required this visit?  No   CCM required this visit?  No      Plan:     I have personally reviewed and noted the following in the patient's chart:   Medical and social history Use of alcohol, tobacco or illicit drugs  Current medications and supplements including opioid prescriptions.  Functional ability and status Nutritional status Physical activity Advanced directives List of other physicians Hospitalizations, surgeries, and ER visits in previous 12 months Vitals Screenings to include cognitive, depression, and falls Referrals and appointments  In addition, I have reviewed and discussed with patient certain preventive protocols, quality metrics, and best practice recommendations. A written personalized care plan for preventive services as well as general preventive health recommendations were provided to patient.     Sandrea Hammond, LPN   [id number]   Nurse Notes: None

## 2021-07-19 NOTE — Patient Instructions (Signed)
[name] , Thank you for taking time to come for your Medicare Wellness Visit. I appreciate your ongoing commitment to your health goals. Please review the following plan we discussed and let me know if I can assist you in the future.   Screening recommendations/referrals: Colonoscopy: Done 04/26/2021 - Repeat in 3 years  Mammogram: Done 07/05/2020 - Repeat annually *keep appointment in November Bone Density: Done 08/29/2020 - Repeat every 2 years Recommended yearly ophthalmology/optometry visit for glaucoma screening and checkup Recommended yearly dental visit for hygiene and checkup  Vaccinations: Influenza vaccine: Done 08/30/2020 - Repeat annually  Pneumococcal vaccine: Done 08/27/2011 & 10/21/2013 Tdap vaccine: Done 01/11/2017 - Repeat in 10 years Shingles vaccine: Due. Shingrix discussed. Please contact your pharmacy for coverage information.     Covid-19: Done 12/17/19, 01/15/20, 10/03/20  Advanced directives: Advance directive discussed with you today. Even though you declined this today, please call our office should you change your mind, and we can give you the proper paperwork for you to fill out.   Conditions/risks identified: Aim for 30 minutes of exercise or brisk walking each day, drink 6-8 glasses of water and eat lots of fruits and vegetables.   Next appointment: Follow up in one year for your annual wellness visit    Preventive Care 65 Years and Older, Female Preventive care refers to lifestyle choices and visits with your health care provider that can promote health and wellness. What does preventive care include? A yearly physical exam. This is also called an annual well check. Dental exams once or twice a year. Routine eye exams. Ask your health care provider how often you should have your eyes checked. Personal lifestyle choices, including: Daily care of your teeth and gums. Regular physical activity. Eating a healthy diet. Avoiding tobacco and drug use. Limiting  alcohol use. Practicing safe sex. Taking low-dose aspirin every day. Taking vitamin and mineral supplements as recommended by your health care provider. What happens during an annual well check? The services and screenings done by your health care provider during your annual well check will depend on your age, overall health, lifestyle risk factors, and family history of disease. Counseling  Your health care provider may ask you questions about your: Alcohol use. Tobacco use. Drug use. Emotional well-being. Home and relationship well-being. Sexual activity. Eating habits. History of falls. Memory and ability to understand (cognition). Work and work Statistician. Reproductive health. Screening  You may have the following tests or measurements: Height, weight, and BMI. Blood pressure. Lipid and cholesterol levels. These may be checked every 5 years, or more frequently if you are over 66 years old. Skin check. Lung cancer screening. You may have this screening every year starting at age 40 if you have a 30-pack-year history of smoking and currently smoke or have quit within the past 15 years. Fecal occult blood test (FOBT) of the stool. You may have this test every year starting at age 67. Flexible sigmoidoscopy or colonoscopy. You may have a sigmoidoscopy every 5 years or a colonoscopy every 10 years starting at age 81. Hepatitis C blood test. Hepatitis B blood test. Sexually transmitted disease (STD) testing. Diabetes screening. This is done by checking your blood sugar (glucose) after you have not eaten for a while (fasting). You may have this done every 1-3 years. Bone density scan. This is done to screen for osteoporosis. You may have this done starting at age 41. Mammogram. This may be done every 1-2 years. Talk to your health care provider about  how often you should have regular mammograms. Talk with your health care provider about your test results, treatment options, and if  necessary, the need for more tests. Vaccines  Your health care provider may recommend certain vaccines, such as: Influenza vaccine. This is recommended every year. Tetanus, diphtheria, and acellular pertussis (Tdap, Td) vaccine. You may need a Td booster every 10 years. Zoster vaccine. You may need this after age 13. Pneumococcal 13-valent conjugate (PCV13) vaccine. One dose is recommended after age 59. Pneumococcal polysaccharide (PPSV23) vaccine. One dose is recommended after age 62. Talk to your health care provider about which screenings and vaccines you need and how often you need them. This information is not intended to replace advice given to you by your health care provider. Make sure you discuss any questions you have with your health care provider. Document Released: 11/18/2015 Document Revised: 07/11/2016 Document Reviewed: 08/23/2015 Elsevier Interactive Patient Education  2017 [place] Prevention in the Home Falls can cause injuries. They can happen to people of all ages. There are many things you can do to make your home safe and to help prevent falls. What can I do on the outside of my home? Regularly fix the edges of walkways and driveways and fix any cracks. Remove anything that might make you trip as you walk through a door, such as a raised step or threshold. Trim any bushes or trees on the path to your home. Use bright outdoor lighting. Clear any walking paths of anything that might make someone trip, such as rocks or tools. Regularly check to see if handrails are loose or broken. Make sure that both sides of any steps have handrails. Any raised decks and porches should have guardrails on the edges. Have any leaves, snow, or ice cleared regularly. Use sand or salt on walking paths during winter. Clean up any spills in your garage right away. This includes oil or grease spills. What can I do in the bathroom? Use night lights. Install grab bars by the toilet  and in the tub and shower. Do not use towel bars as grab bars. Use non-skid mats or decals in the tub or shower. If you need to sit down in the shower, use a plastic, non-slip stool. Keep the floor dry. Clean up any water that spills on the floor as soon as it happens. Remove soap buildup in the tub or shower regularly. Attach bath mats securely with double-sided non-slip rug tape. Do not have throw rugs and other things on the floor that can make you trip. What can I do in the bedroom? Use night lights. Make sure that you have a light by your bed that is easy to reach. Do not use any sheets or blankets that are too big for your bed. They should not hang down onto the floor. Have a firm chair that has side arms. You can use this for support while you get dressed. Do not have throw rugs and other things on the floor that can make you trip. What can I do in the kitchen? Clean up any spills right away. Avoid walking on wet floors. Keep items that you use a lot in easy-to-reach places. If you need to reach something above you, use a strong step stool that has a grab bar. Keep electrical cords out of the way. Do not use floor polish or wax that makes floors slippery. If you must use wax, use non-skid floor wax. Do not have throw rugs and other  things on the floor that can make you trip. What can I do with my stairs? Do not leave any items on the stairs. Make sure that there are handrails on both sides of the stairs and use them. Fix handrails that are broken or loose. Make sure that handrails are as long as the stairways. Check any carpeting to make sure that it is firmly attached to the stairs. Fix any carpet that is loose or worn. Avoid having throw rugs at the top or bottom of the stairs. If you do have throw rugs, attach them to the floor with carpet tape. Make sure that you have a light switch at the top of the stairs and the bottom of the stairs. If you do not have them, ask someone to add  them for you. What else can I do to help prevent falls? Wear shoes that: Do not have high heels. Have rubber bottoms. Are comfortable and fit you well. Are closed at the toe. Do not wear sandals. If you use a stepladder: Make sure that it is fully opened. Do not climb a closed stepladder. Make sure that both sides of the stepladder are locked into place. Ask someone to hold it for you, if possible. Clearly mark and make sure that you can see: Any grab bars or handrails. First and last steps. Where the edge of each step is. Use tools that help you move around (mobility aids) if they are needed. These include: Canes. Walkers. Scooters. Crutches. Turn on the lights when you go into a dark area. Replace any light bulbs as soon as they burn out. Set up your furniture so you have a clear path. Avoid moving your furniture around. If any of your floors are uneven, fix them. If there are any pets around you, be aware of where they are. Review your medicines with your doctor. Some medicines can make you feel dizzy. This can increase your chance of falling. Ask your doctor what other things that you can do to help prevent falls. This information is not intended to replace advice given to you by your health care provider. Make sure you discuss any questions you have with your health care provider. Document Released: 08/18/2009 Document Revised: 03/29/2016 Document Reviewed: 11/26/2014 Elsevier Interactive Patient Education  2017 [employer].

## 2021-07-20 ENCOUNTER — Ambulatory Visit (INDEPENDENT_AMBULATORY_CARE_PROVIDER_SITE_OTHER): Payer: PPO | Admitting: Family Medicine

## 2021-07-20 ENCOUNTER — Other Ambulatory Visit: Payer: Self-pay

## 2021-07-20 ENCOUNTER — Encounter: Payer: Self-pay | Admitting: Family Medicine

## 2021-07-20 VITALS — BP 112/64 | HR 73 | Ht 66.0 in | Wt 143.0 lb

## 2021-07-20 DIAGNOSIS — I480 Paroxysmal atrial fibrillation: Secondary | ICD-10-CM | POA: Diagnosis not present

## 2021-07-20 DIAGNOSIS — Z85850 Personal history of malignant neoplasm of thyroid: Secondary | ICD-10-CM

## 2021-07-20 DIAGNOSIS — M810 Age-related osteoporosis without current pathological fracture: Secondary | ICD-10-CM

## 2021-07-20 DIAGNOSIS — E039 Hypothyroidism, unspecified: Secondary | ICD-10-CM

## 2021-07-20 NOTE — Progress Notes (Signed)
 BP 112/64   Pulse 73   Ht 5' 6" (1.676 m)   Wt 143 lb (64.9 kg)   SpO2 97%   BMI 23.08 kg/m    Subjective:   Patient ID: [name], female    DOB: 11/20/1945, 75 y.o.   MRN: [id number]  HPI: Jeanae G Stebbins is a 75 y.o. female presenting on 07/20/2021 for Medical Management of Chronic Issues and Hypothyroidism   HPI Hypothyroidism recheck Patient is coming in for thyroid recheck today as well. They deny any issues with hair changes or heat or cold problems or diarrhea or constipation. They deny any chest pain or palpitations. They are currently on levothyroxine 137 micrograms   Osteoporosis/osteopenia Fractures or history of fracture: None Medication: Vitamin D and calcium Duration of treatment: At least a few years Last bone density scan: 08/29/2020 Last T score: -2.6  Relevant past medical, surgical, family and social history reviewed and updated as indicated. Interim medical history since our last visit reviewed. Allergies and medications reviewed and updated.  Review of Systems  Constitutional:  Negative for chills and fever.  Eyes:  Negative for visual disturbance.  Respiratory:  Negative for chest tightness and shortness of breath.   Cardiovascular:  Negative for chest pain and leg swelling.  Genitourinary:  Negative for difficulty urinating and dysuria.  Musculoskeletal:  Negative for back pain and gait problem.  Skin:  Negative for rash.  Neurological:  Negative for light-headedness and headaches.  Psychiatric/Behavioral:  Negative for agitation and behavioral problems.   All other systems reviewed and are negative.  Per HPI unless specifically indicated above   Allergies as of 07/20/2021       Reactions   Penicillins Anaphylaxis   Actonel [risedronate Sodium]    Nausea and worsened heart burn   Ceclor [cefaclor] Hives   Acetaminophen Nausea And Vomiting, Other (See Comments)   Headache   Alendronate Sodium Nausea Only, Other (See Comments)    Heartburn, stomach upset   Celecoxib Other (See Comments)   Stomach upset, heart burn   Codeine Nausea And Vomiting, Other (See Comments)   Headache   Ventolin [kdc:albuterol] Palpitations   Rapid heart beat        Medication List        Accurate as of July 20, 2021 11:01 AM. If you have any questions, ask your nurse or doctor.          aspirin 325 MG EC tablet Take 325 mg by mouth daily.   carboxymethylcellul-glycerin 0.5-0.9 % ophthalmic solution Commonly known as: REFRESH OPTIVE Place 1 drop into both eyes in the morning and at bedtime.   Centrum Silver tablet Take 1 tablet by mouth every morning.   PRESERVISION/LUTEIN PO Take 1 capsule by mouth daily.   Fish Oil 1200 MG Caps Take 1 capsule by mouth daily.   flecainide 50 MG tablet Commonly known as: TAMBOCOR TAKE 1 TABLET BY MOUTH TWICE A DAY   levothyroxine 137 MCG tablet Commonly known as: Synthroid Take 1 tablet (137 mcg total) by mouth daily before breakfast.   loratadine 10 MG tablet Commonly known as: CLARITIN Take 10 mg by mouth daily as needed for allergies.   Refresh Lacri-Lube Oint Apply 1 inch to eye at bedtime. Apply to bottom lid   Vitamin D3 50 MCG (2000 UT) Tabs Take 1 capsule by mouth daily.         Objective:   BP 112/64   Pulse 73   Ht 5' 6" (1.676 m)     Wt 143 lb (64.9 kg)   SpO2 97%   BMI 23.08 kg/m   Wt Readings from Last 3 Encounters:  07/20/21 143 lb (64.9 kg)  07/19/21 145 lb (65.8 kg)  04/26/21 145 lb (65.8 kg)    Physical Exam Vitals and nursing note reviewed.  Constitutional:      General: She is not in acute distress.    Appearance: She is well-developed. She is not diaphoretic.  Eyes:     Conjunctiva/sclera: Conjunctivae normal.  Cardiovascular:     Rate and Rhythm: Normal rate. Rhythm irregular.     Heart sounds: Normal heart sounds.  Pulmonary:     Effort: Pulmonary effort is normal. No respiratory distress.     Breath sounds: Normal breath  sounds. No wheezing.  Musculoskeletal:        General: No tenderness. Normal range of motion.  Skin:    General: Skin is warm and dry.     Findings: No rash.  Neurological:     Mental Status: She is alert and oriented to person, place, and time.     Coordination: Coordination normal.  Psychiatric:        Behavior: Behavior normal.      Assessment & Plan:   Problem List Items Addressed This Visit       Cardiovascular and Mediastinum   Atrial fibrillation ([place])   Relevant Orders   CBC with Differential/Platelet   Lipid panel     Endocrine   Hypothyroidism - Primary   Relevant Orders   CMP14+EGFR   TSH     Musculoskeletal and Integument   Osteoporosis, post-menopausal   Relevant Orders   VITAMIN D 25 Hydroxy (Vit-D Deficiency, Fractures)     Other   History of thyroid cancer   Relevant Orders   TSH    Continue current medication, will check blood work and follow-up in 6 months.  Patient also continues to follow with cardiology Follow up plan: Return in about 6 months (around 01/17/2022), or if symptoms worsen or fail to improve, for  thyroid and osteoporosis.  Counseling provided for all of the vaccine components Orders Placed This Encounter  Procedures   CBC with Differential/Platelet   CMP14+EGFR   Lipid panel   VITAMIN D 25 Hydroxy (Vit-D Deficiency, Fractures)   TSH     Caryl Pina, MD [facility] 07/20/2021, 11:01 AM

## 2021-07-21 LAB — CBC WITH DIFFERENTIAL/PLATELET
Basophils Absolute: 0 10*3/uL (ref 0.0–0.2)
Basos: 0 %
EOS (ABSOLUTE): 0.3 10*3/uL (ref 0.0–0.4)
Eos: 5 %
Hematocrit: 37.5 % (ref 34.0–46.6)
Hemoglobin: 13.1 g/dL (ref 11.1–15.9)
Immature Grans (Abs): 0 10*3/uL (ref 0.0–0.1)
Immature Granulocytes: 0 %
Lymphocytes Absolute: 1.7 10*3/uL (ref 0.7–3.1)
Lymphs: 27 %
MCH: 30.2 pg (ref 26.6–33.0)
MCHC: 34.9 g/dL (ref 31.5–35.7)
MCV: 86 fL (ref 79–97)
Monocytes Absolute: 0.7 10*3/uL (ref 0.1–0.9)
Monocytes: 11 %
Neutrophils Absolute: 3.6 10*3/uL (ref 1.4–7.0)
Neutrophils: 57 %
Platelets: 175 10*3/uL (ref 150–450)
RBC: 4.34 x10E6/uL (ref 3.77–5.28)
RDW: 11.9 % (ref 11.7–15.4)
WBC: 6.3 10*3/uL (ref 3.4–10.8)

## 2021-07-21 LAB — CMP14+EGFR
ALT: 15 IU/L (ref 0–32)
AST: 24 IU/L (ref 0–40)
Albumin/Globulin Ratio: 1.6 (ref 1.2–2.2)
Albumin: 4.2 g/dL (ref 3.7–4.7)
Alkaline Phosphatase: 88 IU/L (ref 44–121)
BUN/Creatinine Ratio: 29 — ABNORMAL HIGH (ref 12–28)
BUN: 16 mg/dL (ref 8–27)
Bilirubin Total: 0.6 mg/dL (ref 0.0–1.2)
CO2: 26 mmol/L (ref 20–29)
Calcium: 9.9 mg/dL (ref 8.7–10.3)
Chloride: 101 mmol/L (ref 96–106)
Creatinine, Ser: 0.56 mg/dL — ABNORMAL LOW (ref 0.57–1.00)
Globulin, Total: 2.7 g/dL (ref 1.5–4.5)
Glucose: 83 mg/dL (ref 65–99)
Potassium: 4.5 mmol/L (ref 3.5–5.2)
Sodium: 140 mmol/L (ref 134–144)
Total Protein: 6.9 g/dL (ref 6.0–8.5)
eGFR: 95 mL/min/{1.73_m2} (ref >59)

## 2021-07-21 LAB — LIPID PANEL
Chol/HDL Ratio: 1.8 ratio (ref 0.0–4.4)
Cholesterol, Total: 174 mg/dL (ref 100–199)
HDL: 98 mg/dL (ref >39)
LDL Chol Calc (NIH): 67 mg/dL (ref 0–99)
Triglycerides: 39 mg/dL (ref 0–149)
VLDL Cholesterol Cal: 9 mg/dL (ref 5–40)

## 2021-07-21 LAB — TSH: TSH: <0.005 u[IU]/mL — ABNORMAL LOW (ref 0.450–4.500)

## 2021-07-21 LAB — VITAMIN D 25 HYDROXY (VIT D DEFICIENCY, FRACTURES): Vit D, 25-Hydroxy: 50.8 ng/mL (ref 30.0–100.0)

## 2021-08-29 NOTE — Progress Notes (Signed)
Cardiology Office Note   Date:  08/30/2021   ID:  [name], DOB 1946-07-29, MRN [id number]  PCP:  Dettinger, Fransisca Kaufmann, MD  Cardiologist:   None   Chief Complaint  Patient presents with   Atrial Fibrillation       History of Present Illness: [name] is a 75 y.o. female who presents for follow up of atrial fib.   She has had surgery for treatment of  pheochromocytoma.  She has moderate tricuspid regurgitation with no evidence of pulmonary hypertension.   I checked this on echo last in 2019.  She returns for follow up .    She has done well since I saw her.  She takes care of her yard work and her husband.  He has some difficulty walking so she is having to do more for him.  Marland Kitchendeies    Past Medical History:  Diagnosis Date   Allergy    Atrial fibrillation ([facility])    PT ON FLECANIDE -DID NOT WANT TO BE ON BLOOD THINNERS OTHER THAN DAILY ASA- due to adrenal gland issue - no issues since corrected    Blood transfusion without reported diagnosis    Blurry vision, right eye    HX OF RT EYE VISION DISTURBANCE 01/2012 -PT SEEN BY DR. RANKIN--ELEVATED B/P  THOUGHT TO BE RELATED TO [place] SPOTTED FEVER THOUGHT TO BE CAUSE OF VISION PROBLEM-HAD LASER OF BOTH EYES (SLIGHT DAMAGE TO LEFT  EYE).     Cataract    BILATERAL removed    GERD (gastroesophageal reflux disease)    Headache(784.0)    PAST HX MIGRAINES   Hearing loss of right ear    Heart murmur    TR    Hypertension    B/P HAS BEEN UP AND DOWN BUT MOSTLY DOWN WHILE ON METOPROLOL -WAS TAKEN OFF METOROLOL IN NOV 2012 BY CARDIOLOGIST AND THEN IN MARCH 2013 PT'S B/P BECAME REALLY ELEVATED-SHE RESTARTED METOPROLOL  AND WAS GIVEN BENICAR.  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   Thyroid cancer [facility])    age 23; s/p resection with subsequent hypothyroidism. hx of mets to luns, which was treated (unsure of how)   TMJ locking    PT STATES IF MOUTH OPEN EXTENDED TIME-LIKE AT DENTIST--HER JAWS LOCK   TR (tricuspid regurgitation)    Vegetarian diet    DOES EAT EGGS AND DAIRY--NO MEATS OR FISH    Past Surgical History:  Procedure Laterality Date   ADRENALECTOMY  07/03/2012   Procedure: ADRENALECTOMY;  Surgeon: Dutch Gray, MD;  Location: [facility];  Service: Urology;  Laterality: Right;   APPENDECTOMY     Lung biopsies     THYROIDECTOMY     UPPER GASTROINTESTINAL ENDOSCOPY     WRIST SURGERY       Current Outpatient Medications  Medication Sig Dispense Refill   Artificial Tear Ointment (REFRESH LACRI-LUBE) OINT Apply 1 inch to eye at bedtime. Apply to bottom  lid     aspirin 325 MG EC tablet Take 325 mg by mouth daily.     carboxymethylcellul-glycerin (REFRESH OPTIVE) 0.5-0.9 % ophthalmic solution Place 1 drop into both eyes in the morning and at bedtime.     Cholecalciferol (VITAMIN D3) 2000 UNITS TABS Take 1 capsule by mouth daily.     flecainide (TAMBOCOR) 50 MG tablet TAKE 1 TABLET BY MOUTH TWICE A DAY 180 tablet 1   levothyroxine (SYNTHROID) 137 MCG tablet Take 1 tablet (137 mcg total) by mouth daily before breakfast. 90 tablet 3   loratadine (CLARITIN) 10 MG tablet Take 10 mg by mouth daily as needed for allergies.      Multiple Vitamins-Minerals (CENTRUM SILVER) tablet Take 1 tablet by mouth every morning.     Multiple Vitamins-Minerals (PRESERVISION/LUTEIN PO) Take 1 capsule by mouth daily.     Omega-3 Fatty Acids (FISH OIL) 1200 MG CAPS Take 1 capsule by mouth daily.     No current facility-administered medications for this visit.    Allergies:    Penicillins, Actonel [risedronate sodium], Ceclor [cefaclor], Acetaminophen, Alendronate sodium, Celecoxib, Codeine, and Ventolin [kdc:albuterol]    ROS:  Please see the history of present illness.   Otherwise, review of systems are positive for none.   All other systems are reviewed and negative.    PHYSICAL EXAM: VS:  BP 110/62   Pulse 75   Ht 5\' 6"  (1.676 m)   Wt 147 lb (66.7 kg)   BMI 23.73 kg/m  , BMI Body mass index is 23.73 kg/m. GENERAL:  Well appearing NECK:  No jugular venous distention, waveform within normal limits, carotid upstroke brisk and symmetric, no bruits, no thyromegaly LUNGS:  Clear to auscultation bilaterally CHEST:  Unremarkable HEART:  PMI not displaced or sustained,S1 and S2 within normal limits, no S3, no S4, no clicks, no rubs, very soft left mid sternal border nonradiating murmur, no diastolic murmurs ABD:  Flat, positive bowel sounds normal in frequency in pitch, no bruits, no rebound, no guarding, no midline pulsatile mass, no hepatomegaly, no splenomegaly EXT:  2 plus pulses throughout, no edema, no cyanosis no clubbing   EKG:  EKG is  ordered today. The ekg ordered today demonstrates sinus rhythm, rate 75, left bundle branch block, no change from previous.   Recent Labs: 07/20/2021: ALT 15; BUN 16; Creatinine, Ser 0.56; Hemoglobin 13.1; Platelets 175; Potassium 4.5; Sodium 140; TSH <0.005    Lipid Panel    Component Value Date/Time   CHOL 174 07/20/2021 1108   CHOL 178 02/16/2013 0959   TRIG 39 07/20/2021 1108   TRIG 37 01/11/2017 1004   TRIG 33 02/16/2013 0959   HDL 98 07/20/2021 1108   HDL 84 01/11/2017 1004   HDL 94 02/16/2013 0959   CHOLHDL 1.8 07/20/2021 1108   LDLCALC 67 07/20/2021 1108   LDLCALC 63 06/24/2014 1006   LDLCALC 77 02/16/2013 0959      Wt Readings from Last 3 Encounters:  08/30/21 147 lb (66.7 kg)  07/20/21 143 lb (64.9 kg)  07/19/21 145 lb (65.8 kg)      Other studies Reviewed: Additional studies/ records  that were reviewed today include: Labs Review of the above records demonstrates:  Please see elsewhere in the note.     ASSESSMENT AND PLAN:  HYPERTENSION -  The blood pressure is at target.  Blood work is up-to-date.  No change in therapy.    ATRIAL FIBRILLATION -  She has had no symptomatic paroxysms of this.  She has been intolerant to beta-blockers and calcium channel blockers and these will need to be used carefully if ever started.  She is not on any blood thinner.  No change in therapy.   TRICUSPID REGURGITATION - I would not suspect this to have changed significantly since echo in 2019.  I will follow-up this and the mitral regurgitation clinically.   MITRAL REGURGITATION -  As above  LBBB - This is chronic and unchanged.    Current medicines are reviewed at length with the patient today.  The patient does not have concerns regarding medicines.  The following changes have been made:   None  Labs/ tests ordered today include: None  Orders Placed This Encounter  Procedures   EKG 12-Lead      Disposition:   FU with me in follow-up with me in 1 year.     Signed, Minus Breeding, MD  08/30/2021 9:34 AM    [facility]

## 2021-08-30 ENCOUNTER — Encounter: Payer: Self-pay | Admitting: Cardiology

## 2021-08-30 ENCOUNTER — Ambulatory Visit (INDEPENDENT_AMBULATORY_CARE_PROVIDER_SITE_OTHER): Payer: PPO | Admitting: Cardiology

## 2021-08-30 ENCOUNTER — Other Ambulatory Visit: Payer: Self-pay

## 2021-08-30 ENCOUNTER — Ambulatory Visit (INDEPENDENT_AMBULATORY_CARE_PROVIDER_SITE_OTHER): Payer: PPO

## 2021-08-30 VITALS — BP 110/62 | HR 75 | Ht 66.0 in | Wt 147.0 lb

## 2021-08-30 DIAGNOSIS — Z23 Encounter for immunization: Secondary | ICD-10-CM | POA: Diagnosis not present

## 2021-08-30 DIAGNOSIS — I480 Paroxysmal atrial fibrillation: Secondary | ICD-10-CM

## 2021-08-30 DIAGNOSIS — I447 Left bundle-branch block, unspecified: Secondary | ICD-10-CM

## 2021-08-30 DIAGNOSIS — I10 Essential (primary) hypertension: Secondary | ICD-10-CM

## 2021-08-30 DIAGNOSIS — I361 Nonrheumatic tricuspid (valve) insufficiency: Secondary | ICD-10-CM

## 2021-08-30 DIAGNOSIS — I059 Rheumatic mitral valve disease, unspecified: Secondary | ICD-10-CM | POA: Diagnosis not present

## 2021-08-30 NOTE — Patient Instructions (Signed)
Medication Instructions:  The current medical regimen is effective;  continue present plan and medications.  *If you need a refill on your cardiac medications before your next appointment, please call your pharmacy*  Follow-Up: At [facility], you and your health needs are our priority.  As part of our continuing mission to provide you with exceptional heart care, we have created designated Provider Care Teams.  These Care Teams include your primary Cardiologist (physician) and Advanced Practice Providers (APPs -  Physician Assistants and Nurse Practitioners) who all work together to provide you with the care you need, when you need it.  We recommend signing up for the patient portal called "MyChart".  Sign up information is provided on this After Visit Summary.  MyChart is used to connect with patients for Virtual Visits (Telemedicine).  Patients are able to view lab/test results, encounter notes, upcoming appointments, etc.  Non-urgent messages can be sent to your provider as well.   To learn more about what you can do with MyChart, go to [email or web address].    Your next appointment:   1 year(s)  The format for your next appointment:   In Person  Provider:   James Hochrein, MD   Thank you for choosing  [facility]Care!!    

## 2021-09-13 DIAGNOSIS — Z1231 Encounter for screening mammogram for malignant neoplasm of breast: Secondary | ICD-10-CM | POA: Diagnosis not present

## 2021-10-08 ENCOUNTER — Other Ambulatory Visit: Payer: Self-pay | Admitting: Family Medicine

## 2021-10-08 DIAGNOSIS — I480 Paroxysmal atrial fibrillation: Secondary | ICD-10-CM

## 2021-10-08 DIAGNOSIS — E039 Hypothyroidism, unspecified: Secondary | ICD-10-CM

## 2021-10-19 DIAGNOSIS — H43811 Vitreous degeneration, right eye: Secondary | ICD-10-CM | POA: Diagnosis not present

## 2021-10-19 DIAGNOSIS — H353131 Nonexudative age-related macular degeneration, bilateral, early dry stage: Secondary | ICD-10-CM | POA: Diagnosis not present

## 2021-10-19 DIAGNOSIS — H16213 Exposure keratoconjunctivitis, bilateral: Secondary | ICD-10-CM | POA: Diagnosis not present

## 2021-10-19 DIAGNOSIS — H04123 Dry eye syndrome of bilateral lacrimal glands: Secondary | ICD-10-CM | POA: Diagnosis not present

## 2021-10-19 DIAGNOSIS — Z961 Presence of intraocular lens: Secondary | ICD-10-CM | POA: Diagnosis not present

## 2021-10-19 DIAGNOSIS — H31093 Other chorioretinal scars, bilateral: Secondary | ICD-10-CM | POA: Diagnosis not present

## 2021-11-08 ENCOUNTER — Ambulatory Visit (INDEPENDENT_AMBULATORY_CARE_PROVIDER_SITE_OTHER): Payer: PPO | Admitting: Family Medicine

## 2021-11-08 ENCOUNTER — Encounter: Payer: Self-pay | Admitting: Family Medicine

## 2021-11-08 VITALS — BP 131/68 | HR 82 | Ht 66.0 in | Wt 144.0 lb

## 2021-11-08 DIAGNOSIS — J069 Acute upper respiratory infection, unspecified: Secondary | ICD-10-CM | POA: Diagnosis not present

## 2021-11-08 MED ORDER — DOXYCYCLINE HYCLATE 100 MG PO TABS
100.0000 mg | ORAL_TABLET | Freq: Two times a day (BID) | ORAL | 0 refills | Status: AC
Start: 2021-11-08 — End: 2021-11-18

## 2021-11-08 MED ORDER — METHYLPREDNISOLONE ACETATE 40 MG/ML IJ SUSP
40.0000 mg | Freq: Once | INTRAMUSCULAR | Status: AC
  Administered 2021-11-08: 40 mg via INTRAMUSCULAR

## 2021-11-08 MED ORDER — BENZONATATE 100 MG PO CAPS: 100.0000 mg | ORAL_CAPSULE | Freq: Three times a day (TID) | ORAL | 0 refills | Status: DC | PRN

## 2021-11-08 NOTE — Progress Notes (Signed)
Subjective:  Patient ID: [name], female    DOB: 14-Aug-1946, 76 y.o.   MRN: [id number]  Patient Care Team: Dettinger, Fransisca Kaufmann, MD as PCP - General (Family Medicine) Minus Breeding, MD (Cardiology) Zadie Rhine Clent Demark, MD (Ophthalmology) Clent Jacks, MD (Ophthalmology)   Chief Complaint:  Cough   HPI: [name] is a 76 y.o. female presenting on 11/08/2021 for Cough   Pt presents today with ongoing cough, congestion, and rhinorrhea for over 2 weeks. She has been taking Mucinex and flonase without relief of symptoms. She was using Delsym for cough but this was not beneficial.   Cough This is a new problem. The current episode started 1 to 4 weeks ago. The problem occurs every few minutes. The cough is Productive of sputum. Associated symptoms include nasal congestion, postnasal drip, rhinorrhea and wheezing. Pertinent negatives include no chest pain, chills, ear congestion, ear pain, fever, headaches, heartburn, hemoptysis, myalgias, rash, sore throat, shortness of breath, sweats or weight loss. She has tried OTC cough suppressant and rest for the symptoms. The treatment provided no relief.    Relevant past medical, surgical, family, and social history reviewed and updated as indicated.  Allergies and medications reviewed and updated. Data reviewed: Chart in Epic.   Past Medical History:  Diagnosis Date   Allergy    Atrial fibrillation ([place])    PT ON FLECANIDE -DID NOT WANT TO BE ON BLOOD THINNERS OTHER THAN DAILY ASA- due to adrenal gland issue - no issues since corrected    Blood transfusion without reported diagnosis    Blurry vision, right eye    HX OF RT EYE VISION DISTURBANCE 01/2012 -PT SEEN BY DR. RANKIN--ELEVATED B/P  THOUGHT TO BE RELATED TO [place] SPOTTED FEVER THOUGHT TO BE CAUSE OF VISION PROBLEM-HAD LASER OF BOTH EYES (SLIGHT DAMAGE TO LEFT  EYE).     Cataract    BILATERAL removed    GERD (gastroesophageal reflux disease)    Headache(784.0)     PAST HX MIGRAINES   Hearing loss of right ear    Heart murmur    TR    Hypertension    B/P HAS BEEN UP AND DOWN BUT MOSTLY DOWN WHILE ON METOPROLOL -WAS TAKEN OFF METOROLOL IN NOV 2012 BY CARDIOLOGIST AND THEN IN MARCH 2013 PT'S B/P BECAME REALLY ELEVATED-SHE RESTARTED METOPROLOL  AND WAS GIVEN BENICAR.  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 hx of mets to luns, which was treated (unsure of how)   TMJ locking    PT STATES IF MOUTH OPEN EXTENDED TIME-LIKE AT DENTIST--HER JAWS LOCK   TR (tricuspid regurgitation)    Vegetarian diet    DOES EAT EGGS AND DAIRY--NO MEATS OR FISH    Past Surgical History:  Procedure Laterality Date   ADRENALECTOMY  07/03/2012   Procedure: ADRENALECTOMY;  Surgeon: Dutch Gray, MD;  Location: [facility];  Service: Urology;  Laterality: Right;   APPENDECTOMY     Lung biopsies     THYROIDECTOMY     UPPER GASTROINTESTINAL ENDOSCOPY     WRIST SURGERY      Social History   Socioeconomic History   Marital status: Married    Spouse name: Not on file   Number of children: 1   Years of education: Not on file   Highest education level: Not on file  Occupational History   Occupation: retired  Tobacco Use   Smoking status: Never   Smokeless tobacco: Never   Tobacco comments:    no tobacco   Vaping Use   Vaping Use: Never used  Substance and Sexual Activity   Alcohol use: No   Drug use: No   Sexual activity: Not on file  Other Topics Concern    Not on file  Social History Narrative   Lives in [place] with husband. Used to work full time as a Clinical cytogeneticist, now works one day/week. No herbal meds. Vegetarian; no caffeine Does not exercise regularly but is very active and denies any exertional symptoms.    Daughter lives close by   Social Determinants of Health   Financial Resource Strain: Low Risk    Difficulty of Paying Living Expenses: Not hard at all  Food Insecurity: No Food Insecurity   Worried About Charity fundraiser in the Last Year: Never true   Arboriculturist in the Last Year: Never true  Transportation Needs: No Transportation Needs   Lack of Transportation (Medical): No   Lack of Transportation (Non-Medical): No  Physical Activity: Insufficiently Active   Days of Exercise per Week: 7 days   Minutes of Exercise per Session: 20 min  Stress: No Stress Concern Present   Feeling of Stress : Not at all  Social Connections: Socially Integrated   Frequency of Communication with Friends and Family: More than three times a week   Frequency of Social Gatherings with Friends and Family: More than three times a week   Attends Religious Services: More than 4 times per year   Active Member of [employer] or Organizations: Yes   Attends Music therapist: More than 4 times per year   Marital Status: Married  Human resources officer Violence: Not At Risk   Fear of Current or Ex-Partner: No   Emotionally Abused: No   Physically Abused: No   Sexually Abused: No    Outpatient Encounter Medications as of 11/08/2021  Medication Sig   Artificial Tear Ointment (REFRESH LACRI-LUBE) OINT Apply 1 inch to eye at bedtime. Apply to bottom lid   aspirin 325 MG EC tablet Take 325 mg by mouth daily.   benzonatate (TESSALON PERLES) 100 MG capsule Take 1 capsule (100 mg total) by mouth 3 (three) times daily as needed for cough.   carboxymethylcellul-glycerin (REFRESH OPTIVE) 0.5-0.9 % ophthalmic solution Place 1 drop into both eyes in the  morning and at bedtime.   Cholecalciferol (VITAMIN D3) 2000 UNITS TABS Take 1 capsule by mouth daily.   doxycycline (VIBRA-TABS) 100 MG tablet Take 1 tablet (100 mg total) by mouth 2 (two) times daily for 10 days. 1 po bid   flecainide (TAMBOCOR) 50 MG tablet TAKE 1 TABLET BY MOUTH TWICE A DAY   loratadine (CLARITIN) 10 MG tablet Take 10 mg by mouth daily as needed for allergies.    Multiple Vitamins-Minerals (CENTRUM SILVER) tablet Take 1 tablet by mouth every morning.   Multiple Vitamins-Minerals (  PRESERVISION/LUTEIN PO) Take 1 capsule by mouth daily.   Omega-3 Fatty Acids (FISH OIL) 1200 MG CAPS Take 1 capsule by mouth daily.   SYNTHROID 137 MCG tablet TAKE 1 TABLET (137 MCG TOTAL) BY MOUTH DAILY BEFORE BREAKFAST.   Facility-Administered Encounter Medications as of 11/08/2021  Medication   methylPREDNISolone acetate (DEPO-MEDROL) injection 40 mg    Allergies  Allergen Reactions   Penicillins Anaphylaxis   Actonel [Risedronate Sodium]     Nausea and worsened heart burn   Ceclor [Cefaclor] Hives   Acetaminophen Nausea And Vomiting and Other (See Comments)    Headache   Alendronate Sodium Nausea Only and Other (See Comments)    Heartburn, stomach upset   Celecoxib Other (See Comments)    Stomach upset, heart burn   Codeine Nausea And Vomiting and Other (See Comments)    Headache   Ventolin [Kdc:Albuterol] Palpitations    Rapid heart beat    Review of Systems  Constitutional:  Positive for activity change and fatigue. Negative for appetite change, chills, diaphoresis, fever, unexpected weight change and weight loss.  HENT:  Positive for congestion, postnasal drip and rhinorrhea. Negative for dental problem, drooling, ear discharge, ear pain, facial swelling, hearing loss, mouth sores, nosebleeds, sinus pressure, sinus pain, sneezing, sore throat, tinnitus, trouble swallowing and voice change.   Respiratory:  Positive for cough and wheezing. Negative for apnea, hemoptysis, choking,  chest tightness, shortness of breath and stridor.   Cardiovascular:  Negative for chest pain, palpitations and leg swelling.  Gastrointestinal:  Negative for abdominal pain, constipation, diarrhea, heartburn, nausea and vomiting.  Genitourinary:  Negative for decreased urine volume and difficulty urinating.  Musculoskeletal:  Negative for myalgias.  Skin:  Negative for rash.  Neurological:  Negative for dizziness, tremors, seizures, syncope, facial asymmetry, speech difficulty, weakness, light-headedness, numbness and headaches.  Psychiatric/Behavioral:  Negative for confusion.   All other systems reviewed and are negative.      Objective:  BP 131/68    Pulse 82    Ht 5' 6"  (1.676 m)    Wt 144 lb (65.3 kg)    SpO2 95%    BMI 23.24 kg/m    Wt Readings from Last 3 Encounters:  11/08/21 144 lb (65.3 kg)  08/30/21 147 lb (66.7 kg)  07/20/21 143 lb (64.9 kg)    Physical Exam Constitutional:      General: She is not in acute distress.    Appearance: Normal appearance. She is normal weight. She is not ill-appearing, toxic-appearing or diaphoretic.  HENT:     Head: Normocephalic and atraumatic.     Right Ear: Tympanic membrane, ear canal and external ear normal.     Left Ear: Tympanic membrane, ear canal and external ear normal.     Nose: Nose normal.     Mouth/Throat:     Mouth: Mucous membranes are moist.     Pharynx: Oropharynx is clear. No oropharyngeal exudate or posterior oropharyngeal erythema.  Eyes:     Conjunctiva/sclera: Conjunctivae normal.     Pupils: Pupils are equal, round, and reactive to light.  Cardiovascular:     Rate and Rhythm: Normal rate and regular rhythm.     Heart sounds: Normal heart sounds.  Pulmonary:     Effort: Pulmonary effort is normal.     Breath sounds: Wheezing and rhonchi present.  Musculoskeletal:     Right lower leg: No edema.     Left lower leg: No edema.  Skin:    General: Skin is warm and  dry.     Capillary Refill: Capillary refill  takes less than 2 seconds.  Neurological:     General: No focal deficit present.     Mental Status: She is alert and oriented to person, place, and time.  Psychiatric:        Mood and Affect: Mood normal.        Behavior: Behavior normal.        Thought Content: Thought content normal.        Judgment: Judgment normal.    Results for orders placed or performed in visit on 07/20/21  CBC with Differential/Platelet  Result Value Ref Range   WBC 6.3 3.4 - 10.8 x10E3/uL   RBC 4.34 3.77 - 5.28 x10E6/uL   Hemoglobin 13.1 11.1 - 15.9 g/dL   Hematocrit 37.5 34.0 - 46.6 %   MCV 86 79 - 97 fL   MCH 30.2 26.6 - 33.0 pg   MCHC 34.9 31.5 - 35.7 g/dL   RDW 11.9 11.7 - 15.4 %   Platelets 175 150 - 450 x10E3/uL   Neutrophils 57 Not Estab. %   Lymphs 27 Not Estab. %   Monocytes 11 Not Estab. %   Eos 5 Not Estab. %   Basos 0 Not Estab. %   Neutrophils Absolute 3.6 1.4 - 7.0 x10E3/uL   Lymphocytes Absolute 1.7 0.7 - 3.1 x10E3/uL   Monocytes Absolute 0.7 0.1 - 0.9 x10E3/uL   EOS (ABSOLUTE) 0.3 0.0 - 0.4 x10E3/uL   Basophils Absolute 0.0 0.0 - 0.2 x10E3/uL   Immature Granulocytes 0 Not Estab. %   Immature Grans (Abs) 0.0 0.0 - 0.1 x10E3/uL  CMP14+EGFR  Result Value Ref Range   Glucose 83 65 - 99 mg/dL   BUN 16 8 - 27 mg/dL   Creatinine, Ser 0.56 (L) 0.57 - 1.00 mg/dL   eGFR 95 >59 mL/min/1.73   BUN/Creatinine Ratio 29 (H) 12 - 28   Sodium 140 134 - 144 mmol/L   Potassium 4.5 3.5 - 5.2 mmol/L   Chloride 101 96 - 106 mmol/L   CO2 26 20 - 29 mmol/L   Calcium 9.9 8.7 - 10.3 mg/dL   Total Protein 6.9 6.0 - 8.5 g/dL   Albumin 4.2 3.7 - 4.7 g/dL   Globulin, Total 2.7 1.5 - 4.5 g/dL   Albumin/Globulin Ratio 1.6 1.2 - 2.2   Bilirubin Total 0.6 0.0 - 1.2 mg/dL   Alkaline Phosphatase 88 44 - 121 IU/L   AST 24 0 - 40 IU/L   ALT 15 0 - 32 IU/L  Lipid panel  Result Value Ref Range   Cholesterol, Total 174 100 - 199 mg/dL   Triglycerides 39 0 - 149 mg/dL   HDL 98 >39 mg/dL   VLDL Cholesterol  Cal 9 5 - 40 mg/dL   LDL Chol Calc (NIH) 67 0 - 99 mg/dL   Chol/HDL Ratio 1.8 0.0 - 4.4 ratio  VITAMIN D 25 Hydroxy (Vit-D Deficiency, Fractures)  Result Value Ref Range   Vit D, 25-Hydroxy 50.8 30.0 - 100.0 ng/mL  TSH  Result Value Ref Range   TSH <0.005 (L) 0.450 - 4.500 uIU/mL       Pertinent labs & imaging results that were available during my care of the patient were reviewed by me and considered in my medical decision making.  Assessment & Plan:  Sumer was seen today for cough.  Diagnoses and all orders for this visit:  URI with cough and congestion Ongoing URI with cough and congestion. Wheezing  and rhonchi present, clears with cough. Will burst with steroids in office and place on tessalon and doxycycline due to ongoing symptoms for over 2 weeks. Pt aware to report any new, worsening, or persistent symptoms. Follow up in 2 weeks for reevaluation or sooner if warranted.  -     doxycycline (VIBRA-TABS) 100 MG tablet; Take 1 tablet (100 mg total) by mouth 2 (two) times daily for 10 days. 1 po bid -     benzonatate (TESSALON PERLES) 100 MG capsule; Take 1 capsule (100 mg total) by mouth 3 (three) times daily as needed for cough. -     methylPREDNISolone acetate (DEPO-MEDROL) injection 40 mg    Continue all other maintenance medications.  Follow up plan: Return in about 2 weeks (around 11/22/2021), or if symptoms worsen or fail to improve.   Continue healthy lifestyle choices, including diet (rich in fruits, vegetables, and lean proteins, and low in salt and simple carbohydrates) and exercise (at least 30 minutes of moderate physical activity daily).  Educational handout given for URI  The above assessment and management plan was discussed with the patient. The patient verbalized understanding of and has agreed to the management plan. Patient is aware to call the clinic if they develop any new symptoms or if symptoms persist or worsen. Patient is aware when to return to the  clinic for a follow-up visit. Patient educated on when it is appropriate to go to the emergency department.   Monia Pouch, FNP-C [facility] [phone]

## 2022-01-10 ENCOUNTER — Other Ambulatory Visit: Payer: Self-pay | Admitting: Family Medicine

## 2022-01-10 DIAGNOSIS — I480 Paroxysmal atrial fibrillation: Secondary | ICD-10-CM

## 2022-01-15 ENCOUNTER — Ambulatory Visit (INDEPENDENT_AMBULATORY_CARE_PROVIDER_SITE_OTHER): Payer: PPO | Admitting: Family Medicine

## 2022-01-15 ENCOUNTER — Encounter: Payer: Self-pay | Admitting: Family Medicine

## 2022-01-15 VITALS — BP 100/56 | HR 108 | Ht 66.0 in | Wt 146.0 lb

## 2022-01-15 DIAGNOSIS — E039 Hypothyroidism, unspecified: Secondary | ICD-10-CM

## 2022-01-15 DIAGNOSIS — D3501 Benign neoplasm of right adrenal gland: Secondary | ICD-10-CM | POA: Diagnosis not present

## 2022-01-15 DIAGNOSIS — Z23 Encounter for immunization: Secondary | ICD-10-CM | POA: Diagnosis not present

## 2022-01-15 DIAGNOSIS — I480 Paroxysmal atrial fibrillation: Secondary | ICD-10-CM | POA: Diagnosis not present

## 2022-01-15 DIAGNOSIS — E559 Vitamin D deficiency, unspecified: Secondary | ICD-10-CM | POA: Diagnosis not present

## 2022-01-15 MED ORDER — LEVOTHYROXINE SODIUM 137 MCG PO TABS: ORAL_TABLET | ORAL | 3 refills | Status: DC

## 2022-01-15 MED ORDER — FLECAINIDE ACETATE 50 MG PO TABS: 50.0000 mg | ORAL_TABLET | Freq: Two times a day (BID) | ORAL | 3 refills | Status: DC

## 2022-01-15 NOTE — Progress Notes (Signed)
? ?BP (!) 100/56   Pulse (!) 108   Ht 5' 6"  (1.676 m)   Wt 146 lb (66.2 kg)   SpO2 96%   BMI 23.57 kg/m?   ? ?Subjective:  ? ?Patient ID: [name], female    DOB: 12/13/1945, 76 y.o.   MRN: [id number] ? ?HPI: ?ZANETA LIGHTCAP is a 76 y.o. female presenting on 01/15/2022 for Medical Management of Chronic Issues and Hypothyroidism ? ? ?HPI ?Hypothyroidism recheck with history of thyroid cancer ?Patient is coming in for thyroid recheck today as well. They deny any issues with hair changes or heat or cold problems or diarrhea or constipation. They deny any chest pain or palpitations. They are currently on levothyroxine 137 micrograms  ? ?Vitamin D deficiency ?Patient coming in for check for vitamin D deficiency.  Denies any issues and still takes the vitamin D ? ?A-fib ?Patient has A-fib and takes flecainide, sees cardiology occasionally.  Needs refills.  Denies any chest pain or palpitations or flutters.  Heart rate today is 108 ? 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  She also has a history of pheochromocytoma but blood pressure and everything has been checking out well. ? ?Relevant past medical, surgical, family and social history reviewed and updated as indicated. Interim medical history since our last visit reviewed. ?Allergies and medications reviewed and updated. ? ?Review of Systems  ?Constitutional:  Negative for chills and fever.  ?Eyes:  Negative for visual disturbance.  ?Respiratory:  Negative for chest tightness and shortness of breath.   ?Cardiovascular:  Negative for chest pain and leg swelling.  ?Musculoskeletal:  Negative for back pain and gait problem.  ?Skin:  Negative for rash.  ?Neurological:  Negative for dizziness, light-headedness and headaches.  ?Psychiatric/Behavioral:  Negative for agitation and behavioral problems.   ?All other systems reviewed and are negative. ? ?Per HPI unless specifically indicated  above ? ? ?Allergies as of 01/15/2022   ? ?   Reactions  ? Penicillins Anaphylaxis  ? Actonel [risedronate Sodium]   ? Nausea and worsened heart burn  ? Ceclor [cefaclor] Hives  ? Acetaminophen Nausea And Vomiting, Other (See Comments)  ? Headache  ? Alendronate Sodium Nausea Only, Other (See Comments)  ? Heartburn, stomach upset  ? Celecoxib Other (See Comments)  ? Stomach upset, heart burn  ? Codeine Nausea And Vomiting, Other (See Comments)  ? Headache  ? Ventolin [kdc:albuterol] Palpitations  ? Rapid heart beat  ? ?  ? ?  ?Medication List  ?  ? ?  ? Accurate as of January 15, 2022 11:13 AM. If you have any questions, ask your nurse or doctor.  ?  ?  ? ?  ? ?aspirin 325 MG EC tablet ?Take 325 mg by mouth daily. ?  ?benzonatate 100 MG capsule ?Commonly known as: [place] ?Take 1 capsule (100 mg total) by mouth 3 (three) times daily as needed for cough. ?  ?carboxymethylcellul-glycerin 0.5-0.9 % ophthalmic solution ?Commonly known as: [place] ?Place 1 drop into both eyes in the morning and at bedtime. ?  ?Centrum Silver tablet ?Take 1 tablet by mouth every morning. ?  ?PRESERVISION/LUTEIN PO ?Take 1 capsule by mouth daily. ?  ?Fish Oil 1200 MG Caps ?Take 1 capsule by mouth daily. ?  ?flecainide 50 MG tablet ?Commonly known as: TAMBOCOR ?Take 1 tablet (50 mg total) by mouth 2 (two) times daily. ?  ?levothyroxine 137 MCG tablet ?Commonly known  as: Synthroid ?TAKE 1 TABLET (137 MCG TOTAL) BY MOUTH DAILY BEFORE BREAKFAST. ?  ?loratadine 10 MG tablet ?Commonly known as: CLARITIN ?Take 10 mg by mouth daily as needed for allergies. ?  ?Refresh Lacri-Lube Oint ?Apply 1 inch to eye at bedtime. Apply to bottom lid ?  ?Vitamin D3 50 MCG (2000 UT) Tabs ?Take 1 capsule by mouth daily. ?  ? ?  ? ? ? ?Objective:  ? ?BP (!) 100/56   Pulse (!) 108   Ht 5' 6"  (1.676 m)   Wt 146 lb (66.2 kg)   SpO2 96%   BMI 23.57 kg/m?   ?Wt Readings from Last 3 Encounters:  ?01/15/22 146 lb (66.2 kg)  ?11/08/21 144 lb (65.3 kg)   ?08/30/21 147 lb (66.7 kg)  ?  ?Physical Exam ?Vitals and nursing note reviewed.  ?Constitutional:   ?   General: She is not in acute distress. ?   Appearance: She is well-developed. She is not diaphoretic.  ?Eyes:  ?   Conjunctiva/sclera: Conjunctivae normal.  ?Cardiovascular:  ?   Rate and Rhythm: Normal rate and regular rhythm.  ?   Heart sounds: Normal heart sounds. No murmur heard. ?Pulmonary:  ?   Effort: Pulmonary effort is normal. No respiratory distress.  ?   Breath sounds: Normal breath sounds. No wheezing.  ?Musculoskeletal:     ?   General: No swelling or tenderness. Normal range of motion.  ?Skin: ?   General: Skin is warm and dry.  ?   Findings: No rash.  ?Neurological:  ?   Mental Status: She is alert and oriented to person, place, and time.  ?   Coordination: Coordination normal.  ?Psychiatric:     ?   Behavior: Behavior normal.  ? ? ? ? ?Assessment & Plan:  ? ?Problem List Items Addressed This Visit   ? ?  ? Cardiovascular and Mediastinum  ? Atrial fibrillation ([place])  ? Relevant Medications  ? flecainide (TAMBOCOR) 50 MG tablet  ?  ? Endocrine  ? Hypothyroidism - Primary  ? Relevant Medications  ? levothyroxine (SYNTHROID) 137 MCG tablet  ? Other Relevant Orders  ? CBC with Differential/Platelet  ? CMP14+EGFR  ? Lipid panel  ? TSH  ? VITAMIN D 25 Hydroxy (Vit-D Deficiency, Fractures)  ? Varicella-zoster vaccine IM (Shingrix) (Completed)  ? Benign tumor of adrenal gland  ? Relevant Orders  ? Cortisol  ?  ? Other  ? Vitamin D deficiency  ? Relevant Orders  ? CBC with Differential/Platelet  ? CMP14+EGFR  ? Lipid panel  ? TSH  ? VITAMIN D 25 Hydroxy (Vit-D Deficiency, Fractures)  ? ?Other Visit Diagnoses   ? ? Need for shingles vaccine      ? ?  ?  ?Seems to be doing well, she will come back and do blood work in the a.m. ? ?We will await blood work.  No changes ?Follow up plan: ?Return in about 6 months (around 07/18/2022), or if symptoms worsen or fail to improve, for Thyroid and A-fib  recheck. ? ?Counseling provided for all of the vaccine components ?Orders Placed This Encounter  ?Procedures  ? Varicella-zoster vaccine IM (Shingrix)  ? CBC with Differential/Platelet  ? CMP14+EGFR  ? Lipid panel  ? TSH  ? VITAMIN D 25 Hydroxy (Vit-D Deficiency, Fractures)  ? Cortisol  ? ? ?Caryl Pina, MD ?[place] ?01/15/2022, 11:13 AM ? ? ? ? ?

## 2022-01-18 ENCOUNTER — Other Ambulatory Visit: Payer: PPO

## 2022-01-18 DIAGNOSIS — D3501 Benign neoplasm of right adrenal gland: Secondary | ICD-10-CM | POA: Diagnosis not present

## 2022-01-18 DIAGNOSIS — E039 Hypothyroidism, unspecified: Secondary | ICD-10-CM | POA: Diagnosis not present

## 2022-01-18 DIAGNOSIS — E559 Vitamin D deficiency, unspecified: Secondary | ICD-10-CM | POA: Diagnosis not present

## 2022-01-19 LAB — CBC WITH DIFFERENTIAL/PLATELET
Basophils Absolute: 0 10*3/uL (ref 0.0–0.2)
Basos: 0 %
EOS (ABSOLUTE): 0.6 10*3/uL — ABNORMAL HIGH (ref 0.0–0.4)
Eos: 11 %
Hematocrit: 37.3 % (ref 34.0–46.6)
Hemoglobin: 12.5 g/dL (ref 11.1–15.9)
Immature Grans (Abs): 0 10*3/uL (ref 0.0–0.1)
Immature Granulocytes: 0 %
Lymphocytes Absolute: 1.5 10*3/uL (ref 0.7–3.1)
Lymphs: 26 %
MCH: 29.6 pg (ref 26.6–33.0)
MCHC: 33.5 g/dL (ref 31.5–35.7)
MCV: 88 fL (ref 79–97)
Monocytes Absolute: 0.7 10*3/uL (ref 0.1–0.9)
Monocytes: 12 %
Neutrophils Absolute: 2.9 10*3/uL (ref 1.4–7.0)
Neutrophils: 51 %
Platelets: 179 10*3/uL (ref 150–450)
RBC: 4.23 x10E6/uL (ref 3.77–5.28)
RDW: 13.1 % (ref 11.7–15.4)
WBC: 5.7 10*3/uL (ref 3.4–10.8)

## 2022-01-19 LAB — CMP14+EGFR
ALT: 18 IU/L (ref 0–32)
AST: 24 IU/L (ref 0–40)
Albumin/Globulin Ratio: 1.7 (ref 1.2–2.2)
Albumin: 4 g/dL (ref 3.7–4.7)
Alkaline Phosphatase: 93 IU/L (ref 44–121)
BUN/Creatinine Ratio: 30 — ABNORMAL HIGH (ref 12–28)
BUN: 16 mg/dL (ref 8–27)
Bilirubin Total: 0.6 mg/dL (ref 0.0–1.2)
CO2: 25 mmol/L (ref 20–29)
Calcium: 9.5 mg/dL (ref 8.7–10.3)
Chloride: 104 mmol/L (ref 96–106)
Creatinine, Ser: 0.54 mg/dL — ABNORMAL LOW (ref 0.57–1.00)
Globulin, Total: 2.4 g/dL (ref 1.5–4.5)
Glucose: 88 mg/dL (ref 70–99)
Potassium: 4.4 mmol/L (ref 3.5–5.2)
Sodium: 140 mmol/L (ref 134–144)
Total Protein: 6.4 g/dL (ref 6.0–8.5)
eGFR: 96 mL/min/{1.73_m2} (ref >59)

## 2022-01-19 LAB — LIPID PANEL
Chol/HDL Ratio: 1.7 ratio (ref 0.0–4.4)
Cholesterol, Total: 160 mg/dL (ref 100–199)
HDL: 95 mg/dL (ref >39)
LDL Chol Calc (NIH): 55 mg/dL (ref 0–99)
Triglycerides: 45 mg/dL (ref 0–149)
VLDL Cholesterol Cal: 10 mg/dL (ref 5–40)

## 2022-01-19 LAB — VITAMIN D 25 HYDROXY (VIT D DEFICIENCY, FRACTURES): Vit D, 25-Hydroxy: 49.5 ng/mL (ref 30.0–100.0)

## 2022-01-19 LAB — CORTISOL: Cortisol: 11.9 ug/dL

## 2022-01-19 LAB — TSH: TSH: <0.005 u[IU]/mL — ABNORMAL LOW (ref 0.450–4.500)

## 2022-04-12 ENCOUNTER — Encounter (INDEPENDENT_AMBULATORY_CARE_PROVIDER_SITE_OTHER): Payer: Self-pay | Admitting: Ophthalmology

## 2022-04-12 ENCOUNTER — Ambulatory Visit (INDEPENDENT_AMBULATORY_CARE_PROVIDER_SITE_OTHER): Payer: PPO | Admitting: Ophthalmology

## 2022-04-12 DIAGNOSIS — H348112 Central retinal vein occlusion, right eye, stable: Secondary | ICD-10-CM

## 2022-04-12 DIAGNOSIS — H353131 Nonexudative age-related macular degeneration, bilateral, early dry stage: Secondary | ICD-10-CM

## 2022-04-12 DIAGNOSIS — H35062 Retinal vasculitis, left eye: Secondary | ICD-10-CM | POA: Diagnosis not present

## 2022-04-12 NOTE — Assessment & Plan Note (Signed)
  Small hard drusen, optional use of PreserVision as appropriate

## 2022-04-12 NOTE — Assessment & Plan Note (Signed)
Not active, observe

## 2022-04-12 NOTE — Progress Notes (Signed)
04/12/2022     CHIEF COMPLAINT Patient presents for  Chief Complaint  Patient presents with   Retina Follow Up      HISTORY OF PRESENT ILLNESS: [name] is a 76 y.o. female who presents to the clinic today for:   HPI     Retina Follow Up           Diagnosis: Central Retinal Vein Occlusion   Laterality: right eye   Onset: 1 year ago         Comments   1 yr fu OU FP. Patient states vision is stable and unchanged since last visit. Denies any new floaters or FOL. Patient takes PreserVision one time daily. Patient reports "as long as I have been on the PreserVision the issue with my eye hasn't been any worse."  OCT imaging was ordered on accident, imaging was not done, order will need to be deleted by Dr. Zadie Rhine.      Last edited by Laurin Coder on 04/12/2022  1:19 PM.      Referring physician: Clent Jacks, MD [address] [place],  [place] [address]  HISTORICAL INFORMATION:   Selected notes from the MEDICAL RECORD NUMBER       CURRENT MEDICATIONS: Current Outpatient Medications (Ophthalmic Drugs)  Medication Sig   Artificial Tear Ointment (REFRESH LACRI-LUBE) OINT Apply 1 inch to eye at bedtime. Apply to bottom lid   carboxymethylcellul-glycerin (REFRESH OPTIVE) 0.5-0.9 % ophthalmic solution Place 1 drop into both eyes in the morning and at bedtime.   No current facility-administered medications for this visit. (Ophthalmic Drugs)   Current Outpatient Medications (Other)  Medication Sig   aspirin 325 MG EC tablet Take 325 mg by mouth daily.   benzonatate (TESSALON PERLES) 100 MG capsule Take 1 capsule (100 mg total) by mouth 3 (three) times daily as needed for cough.   Cholecalciferol (VITAMIN D3) 2000 UNITS TABS Take 1 capsule by mouth daily.   flecainide (TAMBOCOR) 50 MG tablet Take 1 tablet (50 mg total) by mouth 2 (two) times daily.   levothyroxine (SYNTHROID) 137 MCG tablet TAKE 1 TABLET (137 MCG TOTAL) BY MOUTH DAILY BEFORE BREAKFAST.    loratadine (CLARITIN) 10 MG tablet Take 10 mg by mouth daily as needed for allergies.    Multiple Vitamins-Minerals (CENTRUM SILVER) tablet Take 1 tablet by mouth every morning.   Multiple Vitamins-Minerals (PRESERVISION/LUTEIN PO) Take 1 capsule by mouth daily.   Omega-3 Fatty Acids (FISH OIL) 1200 MG CAPS Take 1 capsule by mouth daily.   No current facility-administered medications for this visit. (Other)      REVIEW OF SYSTEMS: ROS   Negative for: Constitutional, Gastrointestinal, Neurological, Skin, Genitourinary, Musculoskeletal, HENT, Endocrine, Cardiovascular, Eyes, Respiratory, Psychiatric, Allergic/Imm, Heme/Lymph Last edited by Hurman Horn, MD on 04/12/2022  1:33 PM.       ALLERGIES Allergies  Allergen Reactions   Penicillins Anaphylaxis   Actonel [Risedronate Sodium]     Nausea and worsened heart burn   Ceclor [Cefaclor] Hives   Acetaminophen Nausea And Vomiting and Other (See Comments)    Headache   Alendronate Sodium Nausea Only and Other (See Comments)    Heartburn, stomach upset   Celecoxib Other (See Comments)    Stomach upset, heart burn   Codeine Nausea And Vomiting and Other (See Comments)    Headache   Ventolin [Kdc:Albuterol] Palpitations    Rapid heart beat    PAST MEDICAL HISTORY Past Medical History:  Diagnosis Date  Allergy    Atrial fibrillation (HCC)    PT ON FLECANIDE -DID NOT WANT TO BE ON BLOOD THINNERS OTHER THAN DAILY ASA- due to adrenal gland issue - no issues since corrected    Blood transfusion without reported diagnosis    Blurry vision, right eye    HX OF RT EYE VISION DISTURBANCE 01/2012 -PT SEEN BY DR. Roni Friberg--ELEVATED B/P  THOUGHT TO BE RELATED TO [place] SPOTTED FEVER THOUGHT TO BE CAUSE OF VISION PROBLEM-HAD LASER OF BOTH EYES (SLIGHT DAMAGE TO LEFT  EYE).     Cataract    BILATERAL removed    GERD (gastroesophageal reflux disease)    Headache(784.0)    PAST HX MIGRAINES   Hearing loss of right ear    Heart murmur     TR    Hypertension    B/P HAS BEEN UP AND DOWN BUT MOSTLY DOWN WHILE ON METOPROLOL -WAS TAKEN OFF METOROLOL IN NOV 2012 BY CARDIOLOGIST AND THEN IN MARCH 2013 PT'S B/P BECAME REALLY ELEVATED-SHE RESTARTED METOPROLOL  AND WAS GIVEN BENICAR.  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 hx of mets to luns, which was treated (unsure of how)   TMJ locking    PT STATES IF MOUTH OPEN EXTENDED TIME-LIKE AT DENTIST--HER JAWS LOCK   TR (tricuspid regurgitation)    Vegetarian diet    DOES EAT EGGS AND DAIRY--NO MEATS OR FISH   Past Surgical History:  Procedure Laterality Date   ADRENALECTOMY  07/03/2012   Procedure: ADRENALECTOMY;  Surgeon: Dutch Gray, MD;  Location: [facility];  Service: Urology;  Laterality: Right;   APPENDECTOMY     Lung biopsies     THYROIDECTOMY     UPPER GASTROINTESTINAL ENDOSCOPY     WRIST SURGERY      FAMILY HISTORY Family History  Problem Relation Age of Onset   Kidney disease Mother    Colon polyps Father    Cancer Sister        lymphoma   Colon cancer Neg Hx    Esophageal cancer Neg Hx    Rectal cancer Neg Hx    Stomach cancer Neg Hx     SOCIAL HISTORY Social History   Tobacco Use   Smoking status: Never   Smokeless tobacco: Never   Tobacco comments:    no tobacco   Vaping Use   Vaping Use: Never used  Substance Use Topics   Alcohol use: No   Drug use: No         OPHTHALMIC EXAM:  Base Eye Exam     Visual Acuity (ETDRS)        Right Left   Dist [place] 20/25 +2 20/25 +2         Tonometry (Tonopen, 1:12 PM)       Right Left   Pressure 14 13         Pupils  Pupils Dark Light APD   Right PERRL 4 3 None   Left PERRL 4 3 None         Visual Fields (Counting fingers)       Left Right    Full Full         Extraocular Movement       Right Left    Full Full         Neuro/Psych     Oriented x3: Yes   Mood/Affect: Normal         Dilation     Both eyes: 1.0% Mydriacyl, 2.5% Phenylephrine @ 1:12 PM           Slit Lamp and Fundus Exam     External Exam       Right Left   External Normal Normal         Slit Lamp Exam       Right Left   Lids/Lashes Normal Normal   Conjunctiva/Sclera White and quiet White and quiet   Cornea Clear Clear   Anterior Chamber Deep and quiet Deep and quiet   Iris Round and reactive Round and reactive   Lens Posterior chamber intraocular lens Posterior chamber intraocular lens   Anterior Vitreous Normal Normal         Fundus Exam       Right Left   Posterior Vitreous Normal Normal   Disc Normal Normal   C/D Ratio 0.35 0.35   Macula Normal Normal   Vessels Normal Normal   Periphery Mild scatter photocoagulation nasally. Mild scatter photocoagulation nasally.            IMAGING AND PROCEDURES  Imaging and Procedures for 04/12/22  OCT, Retina - OU - Both Eyes       Right Eye Quality was good. Scan locations included subfoveal. Central Foveal Thickness: 232.   Left Eye Quality was good. Scan locations included subfoveal. Central Foveal Thickness: 254. Findings include normal foveal contour.   Notes No active maculopathy incidental posterior vitreous detachment OU      Color Fundus Photography Optos - OU - Both Eyes       Right Eye Progression has been stable. Disc findings include normal observations. Macula : normal observations. Vessels : normal observations.   Left Eye Progression has been stable. Disc findings  include normal observations. Macula : normal observations. Vessels : normal observations. Periphery : normal observations.   Notes Mild scatter photocoagulation, treatment was at that time for what was apparent retinal vasculitis.  Now stable.              ASSESSMENT/PLAN:  Central retinal vein occlusion of right eye Not active, observe  Retinal vasculitis of left eye Stable over time no sign of recurrence  Early stage nonexudative age-related macular degeneration of both eyes  Small hard drusen, optional use of PreserVision as appropriate     ICD-10-CM   1. Central retinal vein occlusion of right eye, unspecified complication status  O97.3532 OCT, Retina - OU - Both Eyes    Color Fundus Photography Optos - OU - Both Eyes    2. Retinal vasculitis of left eye  H35.062     3. Early stage nonexudative age-related macular degeneration of both eyes  H35.3131       1.  History of CRVO OD, history of retinal vasculitis OS.  Each condition resolved, and stabilized  2.  OS, vasculitis was secondary to recommend spotted fever infection, resolved with systemic antibiotic therapy 2013  no long-term sequelae  3.  Mild ARMD OU, optional use of PreserVision tablets.  Ophthalmic Meds Ordered this visit:  No orders of the defined types were placed in this encounter.      Return in about 1 year (around 04/13/2023) for DILATE OU, COLOR FP, OCT.  There are no Patient Instructions on file for this visit.   Explained the diagnoses, plan, and follow up with the patient and they expressed understanding.  Patient expressed understanding of the importance of proper follow up care.   Clent Demark Abundio Teuscher M.D. Diseases & Surgery of the Retina and Vitreous [facility] [place] 04/12/22     Abbreviations: M myopia (nearsighted); A astigmatism; H hyperopia (farsighted); P presbyopia; Mrx spectacle prescription;  CTL contact lenses; OD right eye; OS left eye; OU both eyes  XT exotropia;  ET esotropia; PEK punctate epithelial keratitis; PEE punctate epithelial erosions; DES dry eye syndrome; MGD meibomian gland dysfunction; ATs artificial tears; PFAT's preservative free artificial tears; [place] nuclear sclerotic cataract; PSC posterior subcapsular cataract; ERM epi-retinal membrane; PVD posterior vitreous detachment; RD retinal detachment; DM diabetes mellitus; DR diabetic retinopathy; NPDR non-proliferative diabetic retinopathy; PDR proliferative diabetic retinopathy; CSME clinically significant macular edema; DME diabetic macular edema; dbh dot blot hemorrhages; CWS cotton wool spot; POAG primary open angle glaucoma; C/D cup-to-disc ratio; HVF humphrey visual field; GVF goldmann visual field; OCT optical coherence tomography; IOP intraocular pressure; BRVO Branch retinal vein occlusion; CRVO central retinal vein occlusion; CRAO central retinal artery occlusion; BRAO branch retinal artery occlusion; RT retinal tear; SB scleral buckle; PPV pars plana vitrectomy; VH Vitreous hemorrhage; PRP panretinal laser photocoagulation; IVK intravitreal kenalog; VMT vitreomacular traction; MH Macular hole;  NVD neovascularization of the disc; NVE neovascularization elsewhere; AREDS age related eye disease study; ARMD age related macular degeneration; POAG primary open angle glaucoma; EBMD epithelial/anterior basement membrane dystrophy; ACIOL anterior chamber intraocular lens; IOL intraocular lens; PCIOL posterior chamber intraocular lens; Phaco/IOL phacoemulsification with intraocular lens placement; [place] photorefractive keratectomy; LASIK laser assisted in situ keratomileusis; HTN hypertension; DM diabetes mellitus; COPD chronic obstructive pulmonary disease

## 2022-04-12 NOTE — Assessment & Plan Note (Signed)
Stable over time no sign of recurrence

## 2022-07-16 ENCOUNTER — Encounter: Payer: Self-pay | Admitting: Family Medicine

## 2022-07-16 ENCOUNTER — Ambulatory Visit (INDEPENDENT_AMBULATORY_CARE_PROVIDER_SITE_OTHER): Payer: PPO | Admitting: Family Medicine

## 2022-07-16 VITALS — BP 92/55 | HR 76 | Ht 66.0 in | Wt 147.0 lb

## 2022-07-16 DIAGNOSIS — Z23 Encounter for immunization: Secondary | ICD-10-CM

## 2022-07-16 DIAGNOSIS — I480 Paroxysmal atrial fibrillation: Secondary | ICD-10-CM

## 2022-07-16 DIAGNOSIS — E039 Hypothyroidism, unspecified: Secondary | ICD-10-CM | POA: Diagnosis not present

## 2022-07-16 DIAGNOSIS — Z85850 Personal history of malignant neoplasm of thyroid: Secondary | ICD-10-CM

## 2022-07-16 NOTE — Progress Notes (Signed)
BP (!) 92/55   Pulse 76   Ht 5' 6"  (1.676 m)   Wt 147 lb (66.7 kg)   SpO2 97%   BMI 23.73 kg/m    Subjective:   Patient ID: [name], female    DOB: 1946-03-16, 76 y.o.   MRN: [id number]  HPI: [name] is a 76 y.o. female presenting on 07/16/2022 for Medical Management of Chronic Issues and Hypothyroidism   HPI A-fib recheck Patient is coming in for A-fib recheck.  Sees cardiology  Hypothyroidism recheck, patient has a history of thyroid cancer Patient is coming in for thyroid recheck today as well. They deny any issues with hair changes or heat or cold problems or diarrhea or constipation. They deny any chest pain or palpitations. They are currently on levothyroxine 137 micrograms   Blood pressure runs on the lower side but patient denies any lightheadedness or dizziness  Relevant past medical, surgical, family and social history reviewed and updated as indicated. Interim medical history since our last visit reviewed. Allergies and medications reviewed and updated.  Review of Systems  Constitutional:  Negative for chills and fever.  Eyes:  Negative for visual disturbance.  Respiratory:  Negative for chest tightness and shortness of breath.   Cardiovascular:  Negative for chest pain and leg swelling.  Skin:  Negative for rash.  Neurological:  Negative for dizziness, light-headedness and headaches.  Psychiatric/Behavioral:  Negative for agitation and behavioral problems.   All other systems reviewed and are negative.   Per HPI unless specifically indicated above   Allergies as of 07/16/2022       Reactions   Penicillins Anaphylaxis   Actonel [risedronate Sodium]    Nausea and worsened heart burn   Ceclor [cefaclor] Hives   Acetaminophen Nausea And Vomiting, Other (See Comments)   Headache   Alendronate Sodium Nausea Only, Other (See Comments)   Heartburn, stomach upset   Celecoxib Other (See Comments)   Stomach upset, heart burn   Codeine Nausea  And Vomiting, Other (See Comments)   Headache   Ventolin [kdc:albuterol] Palpitations   Rapid heart beat        Medication List        Accurate as of July 16, 2022  8:36 AM. If you have any questions, ask your nurse or doctor.          STOP taking these medications    benzonatate 100 MG capsule Commonly known as: Best boy Stopped by: Fransisca Kaufmann Hriday Stai, MD       TAKE these medications    aspirin EC 325 MG tablet Take 325 mg by mouth daily.   carboxymethylcellul-glycerin 0.5-0.9 % ophthalmic solution Commonly known as: REFRESH OPTIVE Place 1 drop into both eyes in the morning and at bedtime.   Centrum Silver tablet Take 1 tablet by mouth every morning.   PRESERVISION/LUTEIN PO Take 1 capsule by mouth daily.   Fish Oil 1200 MG Caps Take 1 capsule by mouth daily.   flecainide 50 MG tablet Commonly known as: TAMBOCOR Take 1 tablet (50 mg total) by mouth 2 (two) times daily.   levothyroxine 137 MCG tablet Commonly known as: Synthroid TAKE 1 TABLET (137 MCG TOTAL) BY MOUTH DAILY BEFORE BREAKFAST.   loratadine 10 MG tablet Commonly known as: CLARITIN Take 10 mg by mouth daily as needed for allergies.   Refresh Lacri-Lube Oint Apply 1 inch to eye at bedtime. Apply to bottom lid   Vitamin D3 50 MCG (2000 UT) Tabs Take 1  capsule by mouth daily.         Objective:   BP (!) 92/55   Pulse 76   Ht 5' 6"  (1.676 m)   Wt 147 lb (66.7 kg)   SpO2 97%   BMI 23.73 kg/m   Wt Readings from Last 3 Encounters:  07/16/22 147 lb (66.7 kg)  01/15/22 146 lb (66.2 kg)  11/08/21 144 lb (65.3 kg)    Physical Exam Vitals and nursing note reviewed.  Constitutional:      General: She is not in acute distress.    Appearance: She is well-developed. She is not diaphoretic.  Eyes:     Conjunctiva/sclera: Conjunctivae normal.  Cardiovascular:     Rate and Rhythm: Normal rate and regular rhythm.     Heart sounds: Normal heart sounds. No murmur  heard. Pulmonary:     Effort: Pulmonary effort is normal. No respiratory distress.     Breath sounds: Normal breath sounds. No wheezing.  Musculoskeletal:        General: No swelling or tenderness. Normal range of motion.  Skin:    General: Skin is warm and dry.     Findings: No rash.  Neurological:     Mental Status: She is alert and oriented to person, place, and time.     Coordination: Coordination normal.  Psychiatric:        Behavior: Behavior normal.       Assessment & Plan:   Problem List Items Addressed This Visit       Cardiovascular and Mediastinum   Atrial fibrillation ([place])   Relevant Orders   CBC with Differential/Platelet   CMP14+EGFR     Endocrine   Hypothyroidism - Primary   Relevant Orders   CMP14+EGFR   TSH     Other   History of thyroid cancer   Relevant Orders   CMP14+EGFR   TSH  Continue current medicine, will check blood work today.  Follow up plan: Return in about 6 months (around 01/14/2023), or if symptoms worsen or fail to improve, for Thyroid recheck.  Counseling provided for all of the vaccine components Orders Placed This Encounter  Procedures   CBC with Differential/Platelet   CMP14+EGFR   TSH    Caryl Pina, MD [facility] 07/16/2022, 8:36 AM

## 2022-07-16 NOTE — Addendum Note (Signed)
Addended by: Alphonzo Dublin on: 07/16/2022 08:57 AM   Modules accepted: Orders

## 2022-07-17 LAB — CBC WITH DIFFERENTIAL/PLATELET
Basophils Absolute: 0 10*3/uL (ref 0.0–0.2)
Basos: 1 %
EOS (ABSOLUTE): 0.4 10*3/uL (ref 0.0–0.4)
Eos: 7 %
Hematocrit: 40.9 % (ref 34.0–46.6)
Hemoglobin: 13.7 g/dL (ref 11.1–15.9)
Immature Grans (Abs): 0 10*3/uL (ref 0.0–0.1)
Immature Granulocytes: 0 %
Lymphocytes Absolute: 1.8 10*3/uL (ref 0.7–3.1)
Lymphs: 33 %
MCH: 29.7 pg (ref 26.6–33.0)
MCHC: 33.5 g/dL (ref 31.5–35.7)
MCV: 89 fL (ref 79–97)
Monocytes Absolute: 0.6 10*3/uL (ref 0.1–0.9)
Monocytes: 10 %
Neutrophils Absolute: 2.6 10*3/uL (ref 1.4–7.0)
Neutrophils: 49 %
Platelets: 172 10*3/uL (ref 150–450)
RBC: 4.61 x10E6/uL (ref 3.77–5.28)
RDW: 12.9 % (ref 11.7–15.4)
WBC: 5.3 10*3/uL (ref 3.4–10.8)

## 2022-07-17 LAB — CMP14+EGFR
ALT: 17 IU/L (ref 0–32)
AST: 24 IU/L (ref 0–40)
Albumin/Globulin Ratio: 1.5 (ref 1.2–2.2)
Albumin: 4.1 g/dL (ref 3.8–4.8)
Alkaline Phosphatase: 95 IU/L (ref 44–121)
BUN/Creatinine Ratio: 22 (ref 12–28)
BUN: 13 mg/dL (ref 8–27)
Bilirubin Total: 0.7 mg/dL (ref 0.0–1.2)
CO2: 22 mmol/L (ref 20–29)
Calcium: 9.7 mg/dL (ref 8.7–10.3)
Chloride: 101 mmol/L (ref 96–106)
Creatinine, Ser: 0.59 mg/dL (ref 0.57–1.00)
Globulin, Total: 2.8 g/dL (ref 1.5–4.5)
Glucose: 88 mg/dL (ref 70–99)
Potassium: 4.4 mmol/L (ref 3.5–5.2)
Sodium: 140 mmol/L (ref 134–144)
Total Protein: 6.9 g/dL (ref 6.0–8.5)
eGFR: 93 mL/min/{1.73_m2} (ref >59)

## 2022-07-17 LAB — TSH: TSH: 0.006 u[IU]/mL — ABNORMAL LOW (ref 0.450–4.500)

## 2022-07-20 ENCOUNTER — Ambulatory Visit (INDEPENDENT_AMBULATORY_CARE_PROVIDER_SITE_OTHER): Payer: PPO

## 2022-07-20 VITALS — Wt 147.0 lb

## 2022-07-20 DIAGNOSIS — Z0000 Encounter for general adult medical examination without abnormal findings: Secondary | ICD-10-CM | POA: Diagnosis not present

## 2022-07-20 NOTE — Patient Instructions (Signed)
Ms. Fullman , Thank you for taking time to come for your Medicare Wellness Visit. I appreciate your ongoing commitment to your health goals. Please review the following plan we discussed and let me know if I can assist you in the future.   Screening recommendations/referrals: Colonoscopy: Done 04/26/2021 - Repeat in 3 years  Mammogram: Done 09/13/2021 - Repeat annually  Bone Density: Done 08/29/2020 - Repeat every 2 years  Recommended yearly ophthalmology/optometry visit for glaucoma screening and checkup Recommended yearly dental visit for hygiene and checkup  Vaccinations: Influenza vaccine: Done 08/30/2021 - Repeat annually  Pneumococcal vaccine: Complete Tdap vaccine: Done 01/11/2017 - Repeat in 10 years Shingles vaccine: Complete   Covid-19: Done x3 - recommend booster in fall - patient declines  Advanced directives: Advance directive discussed with you today. Even though you declined this today, please call our office should you change your mind, and we can give you the proper paperwork for you to fill out.   Conditions/risks identified: Aim for 30 minutes of exercise or brisk walking, 6-8 glasses of water, and 5 servings of fruits and vegetables each day.   Next appointment: Follow up in one year for your annual wellness visit    Preventive Care 65 Years and Older, Female Preventive care refers to lifestyle choices and visits with your health care provider that can promote health and wellness. What does preventive care include? A yearly physical exam. This is also called an annual well check. Dental exams once or twice a year. Routine eye exams. Ask your health care provider how often you should have your eyes checked. Personal lifestyle choices, including: Daily care of your teeth and gums. Regular physical activity. Eating a healthy diet. Avoiding tobacco and drug use. Limiting alcohol use. Practicing safe sex. Taking low-dose aspirin every day. Taking vitamin and mineral  supplements as recommended by your health care provider. What happens during an annual well check? The services and screenings done by your health care provider during your annual well check will depend on your age, overall health, lifestyle risk factors, and family history of disease. Counseling  Your health care provider may ask you questions about your: Alcohol use. Tobacco use. Drug use. Emotional well-being. Home and relationship well-being. Sexual activity. Eating habits. History of falls. Memory and ability to understand (cognition). Work and work Statistician. Reproductive health. Screening  You may have the following tests or measurements: Height, weight, and BMI. Blood pressure. Lipid and cholesterol levels. These may be checked every 5 years, or more frequently if you are over 62 years old. Skin check. Lung cancer screening. You may have this screening every year starting at age 46 if you have a 30-pack-year history of smoking and currently smoke or have quit within the past 15 years. Fecal occult blood test (FOBT) of the stool. You may have this test every year starting at age 24. Flexible sigmoidoscopy or colonoscopy. You may have a sigmoidoscopy every 5 years or a colonoscopy every 10 years starting at age 16. Hepatitis C blood test. Hepatitis B blood test. Sexually transmitted disease (STD) testing. Diabetes screening. This is done by checking your blood sugar (glucose) after you have not eaten for a while (fasting). You may have this done every 1-3 years. Bone density scan. This is done to screen for osteoporosis. You may have this done starting at age 24. Mammogram. This may be done every 1-2 years. Talk to your health care provider about how often you should have regular mammograms. Talk with your health  care provider about your test results, treatment options, and if necessary, the need for more tests. Vaccines  Your health care provider may recommend certain  vaccines, such as: Influenza vaccine. This is recommended every year. Tetanus, diphtheria, and acellular pertussis (Tdap, Td) vaccine. You may need a Td booster every 10 years. Zoster vaccine. You may need this after age 49. Pneumococcal 13-valent conjugate (PCV13) vaccine. One dose is recommended after age 54. Pneumococcal polysaccharide (PPSV23) vaccine. One dose is recommended after age 24. Talk to your health care provider about which screenings and vaccines you need and how often you need them. This information is not intended to replace advice given to you by your health care provider. Make sure you discuss any questions you have with your health care provider. Document Released: 11/18/2015 Document Revised: 07/11/2016 Document Reviewed: 08/23/2015 Elsevier Interactive Patient Education  2017 [place] Prevention in the Home Falls can cause injuries. They can happen to people of all ages. There are many things you can do to make your home safe and to help prevent falls. What can I do on the outside of my home? Regularly fix the edges of walkways and driveways and fix any cracks. Remove anything that might make you trip as you walk through a door, such as a raised step or threshold. Trim any bushes or trees on the path to your home. Use bright outdoor lighting. Clear any walking paths of anything that might make someone trip, such as rocks or tools. Regularly check to see if handrails are loose or broken. Make sure that both sides of any steps have handrails. Any raised decks and porches should have guardrails on the edges. Have any leaves, snow, or ice cleared regularly. Use sand or salt on walking paths during winter. Clean up any spills in your garage right away. This includes oil or grease spills. What can I do in the bathroom? Use night lights. Install grab bars by the toilet and in the tub and shower. Do not use towel bars as grab bars. Use non-skid mats or decals in  the tub or shower. If you need to sit down in the shower, use a plastic, non-slip stool. Keep the floor dry. Clean up any water that spills on the floor as soon as it happens. Remove soap buildup in the tub or shower regularly. Attach bath mats securely with double-sided non-slip rug tape. Do not have throw rugs and other things on the floor that can make you trip. What can I do in the bedroom? Use night lights. Make sure that you have a light by your bed that is easy to reach. Do not use any sheets or blankets that are too big for your bed. They should not hang down onto the floor. Have a firm chair that has side arms. You can use this for support while you get dressed. Do not have throw rugs and other things on the floor that can make you trip. What can I do in the kitchen? Clean up any spills right away. Avoid walking on wet floors. Keep items that you use a lot in easy-to-reach places. If you need to reach something above you, use a strong step stool that has a grab bar. Keep electrical cords out of the way. Do not use floor polish or wax that makes floors slippery. If you must use wax, use non-skid floor wax. Do not have throw rugs and other things on the floor that can make you trip. What can  I do with my stairs? Do not leave any items on the stairs. Make sure that there are handrails on both sides of the stairs and use them. Fix handrails that are broken or loose. Make sure that handrails are as long as the stairways. Check any carpeting to make sure that it is firmly attached to the stairs. Fix any carpet that is loose or worn. Avoid having throw rugs at the top or bottom of the stairs. If you do have throw rugs, attach them to the floor with carpet tape. Make sure that you have a light switch at the top of the stairs and the bottom of the stairs. If you do not have them, ask someone to add them for you. What else can I do to help prevent falls? Wear shoes that: Do not have high  heels. Have rubber bottoms. Are comfortable and fit you well. Are closed at the toe. Do not wear sandals. If you use a stepladder: Make sure that it is fully opened. Do not climb a closed stepladder. Make sure that both sides of the stepladder are locked into place. Ask someone to hold it for you, if possible. Clearly mark and make sure that you can see: Any grab bars or handrails. First and last steps. Where the edge of each step is. Use tools that help you move around (mobility aids) if they are needed. These include: Canes. Walkers. Scooters. Crutches. Turn on the lights when you go into a dark area. Replace any light bulbs as soon as they burn out. Set up your furniture so you have a clear path. Avoid moving your furniture around. If any of your floors are uneven, fix them. If there are any pets around you, be aware of where they are. Review your medicines with your doctor. Some medicines can make you feel dizzy. This can increase your chance of falling. Ask your doctor what other things that you can do to help prevent falls. This information is not intended to replace advice given to you by your health care provider. Make sure you discuss any questions you have with your health care provider. Document Released: 08/18/2009 Document Revised: 03/29/2016 Document Reviewed: 11/26/2014 Elsevier Interactive Patient Education  2017 [employer].

## 2022-07-20 NOTE — Progress Notes (Signed)
Subjective:   [name] is a 76 y.o. female who presents for Medicare Annual (Subsequent) preventive examination.  Virtual Visit via Telephone Note  I connected with  [name] on 07/20/22 at  2:00 PM EDT by telephone and verified that I am speaking with the correct person using two identifiers.  Location: Patient: Home Provider: [facility] Persons participating in the virtual visit: patient/Nurse Health Advisor   I discussed the limitations, risks, security and privacy concerns of performing an evaluation and management service by telephone and the availability of in person appointments. The patient expressed understanding and agreed to proceed.  Interactive audio and video telecommunications were attempted between this nurse and patient, however failed, due to patient having technical difficulties OR patient did not have access to video capability.  We continued and completed visit with audio only.  Some vital signs may be absent or patient reported.   Garey Alleva E Tristin Vandeusen, LPN   Review of Systems     Cardiac Risk Factors include: advanced age (>38mn, >>39women);Other (see comment), Risk factor comments: A.Fib     Objective:    Today's Vitals   07/20/22 1337  Weight: 147 lb (66.7 kg)   Body mass index is 23.73 kg/m.     07/20/2022    1:41 PM 07/19/2021    2:40 PM 02/03/2020   10:27 AM 07/04/2012    8:00 PM 06/27/2012   11:46 AM  Advanced Directives  Does Patient Have a Medical Advance Directive? No No No Patient does not have advance directive;Patient would not like information Patient does not have advance directive;Patient would not like information  Would patient like information on creating a medical advance directive? No - Patient declined No - Patient declined No - Patient declined      Current Medications (verified) Outpatient Encounter Medications as of 07/20/2022  Medication Sig   Artificial Tear Ointment (REFRESH LACRI-LUBE) OINT Apply 1 inch to eye at  bedtime. Apply to bottom lid   aspirin 325 MG EC tablet Take 325 mg by mouth daily.   carboxymethylcellul-glycerin (REFRESH OPTIVE) 0.5-0.9 % ophthalmic solution Place 1 drop into both eyes in the morning and at bedtime.   Cholecalciferol (VITAMIN D3) 2000 UNITS TABS Take 1 capsule by mouth daily.   flecainide (TAMBOCOR) 50 MG tablet Take 1 tablet (50 mg total) by mouth 2 (two) times daily.   levothyroxine (SYNTHROID) 137 MCG tablet TAKE 1 TABLET (137 MCG TOTAL) BY MOUTH DAILY BEFORE BREAKFAST.   loratadine (CLARITIN) 10 MG tablet Take 10 mg by mouth daily as needed for allergies.    Multiple Vitamins-Minerals (CENTRUM SILVER) tablet Take 1 tablet by mouth every morning.   Multiple Vitamins-Minerals (PRESERVISION/LUTEIN PO) Take 1 capsule by mouth daily.   Omega-3 Fatty Acids (FISH OIL) 1200 MG CAPS Take 1 capsule by mouth daily.   No facility-administered encounter medications on file as of 07/20/2022.    Allergies (verified) Penicillins, Actonel [risedronate sodium], Ceclor [cefaclor], Acetaminophen, Alendronate sodium, Celecoxib, Codeine, and Ventolin [kdc:albuterol]   History: Past Medical History:  Diagnosis Date   Allergy    Atrial fibrillation (HCC)    PT ON FLECANIDE -DID NOT WANT TO BE ON BLOOD THINNERS OTHER THAN DAILY ASA- due to adrenal gland issue - no issues since corrected    Blood transfusion without reported diagnosis    Blurry vision, right eye    HX OF RT EYE VISION DISTURBANCE 01/2012 -PT SEEN BY DR. RANKIN--ELEVATED B/P  THOUGHT TO BE RELATED TO R[place] SPOTTED  FEVER THOUGHT TO BE CAUSE OF VISION PROBLEM-HAD LASER OF BOTH EYES (SLIGHT DAMAGE TO LEFT  EYE).     Cataract    BILATERAL removed    GERD (gastroesophageal reflux disease)    Headache(784.0)    PAST HX MIGRAINES   Hearing loss of right ear    Heart murmur    TR    Hypertension    B/P HAS BEEN UP AND DOWN BUT MOSTLY DOWN WHILE ON METOPROLOL -WAS TAKEN OFF METOROLOL IN NOV 2012 BY CARDIOLOGIST AND  THEN IN MARCH 2013 PT'S B/P BECAME REALLY ELEVATED-SHE RESTARTED METOPROLOL  AND WAS GIVEN BENICAR.  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 hx of mets to luns, which was treated (unsure of how)   TMJ locking    PT STATES IF MOUTH OPEN EXTENDED TIME-LIKE AT DENTIST--HER JAWS LOCK   TR (tricuspid regurgitation)    Vegetarian diet    DOES EAT EGGS AND DAIRY--NO MEATS OR FISH   Past Surgical History:  Procedure Laterality Date   ADRENALECTOMY  07/03/2012   Procedure: ADRENALECTOMY;  Surgeon: Dutch Gray, MD;  Location: [facility];  Service: Urology;  Laterality: Right;   APPENDECTOMY     Lung biopsies     THYROIDECTOMY     UPPER GASTROINTESTINAL ENDOSCOPY     WRIST SURGERY     Family History  Problem Relation Age of Onset   Kidney disease Mother    Colon polyps Father    Cancer Sister        lymphoma   Colon cancer Neg Hx    Esophageal cancer Neg Hx    Rectal cancer Neg Hx    Stomach cancer Neg Hx    Social History   Socioeconomic History   Marital status: Married    Spouse name: Wynetta Emery   Number of children: 1   Years of education: Not on file   Highest education level: Not on file  Occupational History   Occupation: retired  Tobacco Use   Smoking status: Never   Smokeless tobacco: Never   Tobacco comments:    no tobacco   Vaping Use   Vaping Use: Never used  Substance and Sexual Activity   Alcohol use: No   Drug use: No   Sexual activity: Not on file   Other Topics Concern   Not on file  Social History Narrative   Lives in [place] with husband. Used to work full time as a Clinical cytogeneticist, now works one day/week. No herbal meds. Vegetarian; no caffeine Does not exercise regularly but is very active and denies any exertional symptoms.    Daughter lives close by   Social Determinants of Health   Financial Resource Strain: Low Risk  (07/20/2022)   Overall Financial Resource Strain (CARDIA)    Difficulty of Paying Living Expenses: Not hard at all  Food Insecurity: No Food Insecurity (07/20/2022)  Hunger Vital Sign    Worried About Running Out of Food in the Last Year: Never true    Ran Out of Food in the Last Year: Never true  Transportation Needs: No Transportation Needs (07/20/2022)   PRAPARE - Hydrologist (Medical): No    Lack of Transportation (Non-Medical): No  Physical Activity: Insufficiently Active (07/20/2022)   Exercise Vital Sign    Days of Exercise per Week: 7 days    Minutes of Exercise per Session: 20 min  Stress: No Stress Concern Present (07/20/2022)   [place]    Feeling of Stress : Not at all  Social Connections: [place] (07/20/2022)   Social Connection and Isolation Panel [NHANES]    Frequency of Communication with Friends and Family: More than three times a week    Frequency of Social Gatherings with Friends and Family: More than three times a week    Attends Religious Services: More than 4 times per year    Active Member of [employer] or Organizations: Yes    Attends Music therapist: More than 4 times per year    Marital Status: Married    Tobacco Counseling Counseling given: Not Answered Tobacco comments: no tobacco    Clinical Intake:  Pre-visit preparation completed: Yes  Pain : No/denies pain     BMI - recorded: 23.73 Nutritional Status: BMI of 19-24  Normal Nutritional Risks:  None Diabetes: No  How often do you need to have someone help you when you read instructions, pamphlets, or other written materials from your doctor or pharmacy?: 1 - Never  Diabetic? no  Interpreter Needed?: No  Information entered by :: Kamalei Roeder, LPN   Activities of Daily Living    07/20/2022    1:41 PM  In your present state of health, do you have any difficulty performing the following activities:  Hearing? 0  Vision? 0  Difficulty concentrating or making decisions? 0  Walking or climbing stairs? 0  Dressing or bathing? 0  Doing errands, shopping? 0  Preparing Food and eating ? N  Using the Toilet? N  In the past six months, have you accidently leaked urine? N  Do you have problems with loss of bowel control? N  Managing your Medications? N  Managing your Finances? N  Housekeeping or managing your Housekeeping? N    Patient Care Team: Dettinger, Fransisca Kaufmann, MD as PCP - General (Family Medicine) Minus Breeding, MD (Cardiology) Zadie Rhine Clent Demark, MD (Ophthalmology) Clent Jacks, MD (Ophthalmology)  Indicate any recent Medical Services you may have received from other than [facility] providers in the past year (date may be approximate).     Assessment:   This is a routine wellness examination for [name].  Hearing/Vision screen Hearing Screening - Comments:: Denies hearing difficulties   Vision Screening - Comments:: Wears rx glasses - up to date with routine eye exams with [facility]  Dietary issues and exercise activities discussed: Current Exercise Habits: Home exercise routine, Type of exercise: walking;Other - see comments (yard and house work), Time (Minutes): 20, Frequency (Times/Week): 7, Weekly Exercise (Minutes/Week): 140, Intensity: Mild, Exercise limited by: cardiac condition(s)   Goals Addressed             This Visit's Progress    Exercise 150 minutes per week (moderate activity)   On track    Hope to stay as healthy and independent as you are now  Depression Screen    07/20/2022    1:39 PM 07/16/2022    8:33 AM 07/20/2021   10:36 AM 07/19/2021    2:34 PM 01/16/2021   10:29 AM 08/29/2020    3:32 PM 07/19/2020   10:53 AM  PHQ 2/9 Scores  PHQ - 2 Score 0 0 0 0 0 0 0  PHQ- 9 Score 0 0         Fall Risk    07/20/2022    1:37 PM 07/16/2022    8:33 AM 07/20/2021   10:35 AM 07/19/2021    2:41 PM 01/16/2021   10:29 AM  Bells in the past year? 0 0 0 0 0  Number falls in past yr: 0   0   Injury with Fall? 0   0   Risk for fall due to : No Fall Risks      Follow up Falls prevention discussed   Falls prevention discussed     FALL RISK PREVENTION PERTAINING TO THE HOME:  Any stairs in or around the home? Yes  If so, are there any without handrails? No  Home free of loose throw rugs in walkways, pet beds, electrical cords, etc? Yes  Adequate lighting in your home to reduce risk of falls? Yes   ASSISTIVE DEVICES UTILIZED TO PREVENT FALLS:  Life alert? No  Use of a cane, [place] or w/c? No  Grab bars in the bathroom? No  Shower chair or bench in shower? No  Elevated toilet seat or a handicapped toilet? Yes   TIMED UP AND GO:  Was the test performed? No . Telephonic visit   Cognitive Function:    08/22/2017    2:45 PM  MMSE - Mini Mental State Exam  Orientation to time 5  Orientation to Place 5  Registration 3  Attention/ Calculation 5  Recall 3  Language- name 2 objects 2  Language- repeat 1  Language- follow 3 step command 3  Language- read & follow direction 1  Write a sentence 1  Copy design 0  Total score 29        07/20/2022    1:42 PM 02/03/2020   10:19 AM  6CIT Screen  What Year? 0 points 0 points  What month? 0 points 0 points  What time? 0 points 0 points  Count back from 20 0 points 0 points  Months in reverse 0 points 0 points  Repeat phrase 0 points 0 points  Total Score 0 points 0 points    Immunizations Immunization History  Administered Date(s) Administered   Fluad Quad(high  Dose 65+) 08/20/2019, 08/30/2020, 08/30/2021   Influenza Whole 08/05/2010   Influenza, High Dose Seasonal PF 08/13/2016, 08/26/2017, 09/01/2018   Influenza,inj,Quad PF,6+ Mos 08/19/2013, 08/19/2014, 08/11/2015   Moderna Sars-Covid-2 Vaccination 12/17/2019, 01/15/2020, 10/03/2020   Pneumococcal Conjugate-13 10/21/2013   Pneumococcal Polysaccharide-23 11/05/1994, 08/27/2011   Td 11/05/2006   Tdap 01/11/2017   Zoster Recombinat (Shingrix) 01/15/2022, 07/16/2022    TDAP status: Up to date  Flu Vaccine status: Up to date  Pneumococcal vaccine status: Up to date  Covid-19 vaccine status: Completed vaccines  Qualifies for Shingles Vaccine? Yes   Zostavax completed Yes   Shingrix Completed?: Yes  Screening Tests Health Maintenance  Topic Date Due   COVID-19 Vaccine (4 - Moderna risk series) 11/28/2020   INFLUENZA VACCINE  06/05/2022   PAP SMEAR-Modifier  08/29/2022   DEXA SCAN  08/29/2022   MAMMOGRAM  09/13/2022   COLONOSCOPY (Pts  45-66yr Insurance coverage will need to be confirmed)  04/26/2024   TETANUS/TDAP  01/12/2027   Pneumonia Vaccine 76 Years old  Completed   Hepatitis C Screening  Completed   Zoster Vaccines- Shingrix  Completed   HPV VACCINES  Aged Out   Fecal DNA (Cologuard)  Discontinued    Health Maintenance  Health Maintenance Due  Topic Date Due   COVID-19 Vaccine (4 - Moderna risk series) 11/28/2020   INFLUENZA VACCINE  06/05/2022   PAP SMEAR-Modifier  08/29/2022    Colorectal cancer screening: Type of screening: Colonoscopy. Completed 04/26/2021. Repeat every 3 years  Mammogram status: Completed 09/13/2021. Repeat every year  Bone Density status: Completed 08/29/2020. Results reflect: Bone density results: OSTEOPOROSIS. Repeat every 2 years.  Lung Cancer Screening: (Low Dose CT Chest recommended if Age 76-80years, 30 pack-year currently smoking OR have quit w/in 15years.) does not qualify.   Additional Screening:  Hepatitis C Screening: does  qualify; Completed 08/20/2019  Vision Screening: Recommended annual ophthalmology exams for early detection of glaucoma and other disorders of the eye. Is the patient up to date with their annual eye exam?  Yes  Who is the provider or what is the name of the office in which the patient attends annual eye exams? [facility] If pt is not established with a provider, would they like to be referred to a provider to establish care? No .   Dental Screening: Recommended annual dental exams for proper oral hygiene  Community Resource Referral / Chronic Care Management: CRR required this visit?  No   CCM required this visit?  No      Plan:     I have personally reviewed and noted the following in the patient's chart:   Medical and social history Use of alcohol, tobacco or illicit drugs  Current medications and supplements including opioid prescriptions. Patient is not currently taking opioid prescriptions. Functional ability and status Nutritional status Physical activity Advanced directives List of other physicians Hospitalizations, surgeries, and ER visits in previous 12 months Vitals Screenings to include cognitive, depression, and falls Referrals and appointments  In addition, I have reviewed and discussed with patient certain preventive protocols, quality metrics, and best practice recommendations. A written personalized care plan for preventive services as well as general preventive health recommendations were provided to patient.     ASandrea Hammond LPN   [id number]  Nurse Notes: None

## 2022-08-31 ENCOUNTER — Ambulatory Visit (INDEPENDENT_AMBULATORY_CARE_PROVIDER_SITE_OTHER): Payer: PPO

## 2022-08-31 DIAGNOSIS — Z23 Encounter for immunization: Secondary | ICD-10-CM

## 2022-09-09 NOTE — Progress Notes (Unsigned)
Cardiology Office Note   Date:  09/12/2022   ID:  [name], DOB [birth year], MRN [id number]  PCP:  Dettinger, Fransisca Kaufmann, MD  Cardiologist:   None   Chief Complaint  Patient presents with   Atrial Fibrillation     History of Present Illness: [name] is a 76 y.o. female who presents for follow up of atrial fib.   She has had surgery for treatment of  pheochromocytoma.  She has moderate tricuspid regurgitation with no evidence of pulmonary hypertension.    She returns for follow up.  She has done well.  Her husband has some health problems.  She helps with him.  The patient denies any new symptoms such as chest discomfort, neck or arm discomfort. There has been no new shortness of breath, PND or orthopnea. There have been no reported palpitations, presyncope or syncope.    Past Medical History:  Diagnosis Date   Allergy    Atrial fibrillation ([place])    PT ON FLECANIDE -DID NOT WANT TO BE ON BLOOD THINNERS OTHER THAN DAILY ASA- due to adrenal gland issue - no issues since corrected    Blood transfusion without reported diagnosis    Blurry vision, right eye    HX OF RT EYE VISION DISTURBANCE 01/2012 -PT SEEN BY DR. RANKIN--ELEVATED B/P  THOUGHT TO BE RELATED TO [place] SPOTTED FEVER THOUGHT TO BE CAUSE OF VISION PROBLEM-HAD LASER OF BOTH EYES (SLIGHT DAMAGE TO LEFT  EYE).     Cataract    BILATERAL removed    GERD (gastroesophageal reflux disease)    Headache(784.0)    PAST HX MIGRAINES   Hearing loss of right ear    Heart murmur    TR    Hypertension    B/P HAS BEEN UP AND DOWN BUT MOSTLY DOWN WHILE ON METOPROLOL -WAS TAKEN OFF METOROLOL IN NOV 2012 BY CARDIOLOGIST AND THEN IN MARCH 2013 PT'S B/P BECAME REALLY ELEVATED-SHE RESTARTED METOPROLOL  AND WAS GIVEN BENICAR.  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 ON RIGHT ADRENAL -THOUGHT TO BE PHEOCHROMOCYTOMA   Hypothyroidism    Mitral valve prolapse    per pt report, not listed on echo   MR (mitral regurgitation)     none on echo in 2013   Osteoarthritis    LITTLE FINGER LEFT HAND   Palpitations JUNE 2012   piror to diagnosis of SVT  -NO PROBLMS WITH REOCCURANCE   Pheochromocytoma    PONV (postoperative nausea and vomiting)    HX OF N&V AFTER SURGERIES-EXCEPT NO PROBLEM AFTER WRIST SURGERY IN 2012-AT [facility]   Thyroid cancer [facility])    age 58; s/p resection with subsequent hypothyroidism. hx of mets to luns, which was treated (unsure of how)   TMJ locking    PT STATES IF MOUTH OPEN EXTENDED TIME-LIKE AT DENTIST--HER JAWS LOCK   TR (tricuspid regurgitation)    Vegetarian diet    DOES EAT EGGS AND DAIRY--NO MEATS OR FISH    Past Surgical History:  Procedure Laterality Date   ADRENALECTOMY  07/03/2012   Procedure: ADRENALECTOMY;  Surgeon: Dutch Gray, MD;  Location: [facility];  Service: Urology;  Laterality: Right;   APPENDECTOMY     Lung biopsies     THYROIDECTOMY     UPPER GASTROINTESTINAL ENDOSCOPY     WRIST SURGERY       Current Outpatient Medications  Medication Sig Dispense Refill   Artificial Tear Ointment (REFRESH LACRI-LUBE) OINT Apply 1 inch to eye at  bedtime. Apply to bottom lid     aspirin 325 MG EC tablet Take 325 mg by mouth daily.     carboxymethylcellul-glycerin (REFRESH OPTIVE) 0.5-0.9 % ophthalmic solution Place 1 drop into both eyes in the morning and at bedtime.     Cholecalciferol (VITAMIN D3) 2000 UNITS TABS Take 1 capsule by mouth daily.     flecainide (TAMBOCOR) 50 MG tablet Take 1 tablet (50 mg total) by mouth 2 (two) times daily. 180 tablet 3   levothyroxine (SYNTHROID) 137 MCG tablet TAKE 1 TABLET (137 MCG TOTAL) BY MOUTH DAILY BEFORE BREAKFAST. 90 tablet 3   loratadine (CLARITIN) 10 MG tablet Take 10 mg by mouth daily as needed for allergies.      Multiple Vitamins-Minerals (CENTRUM SILVER) tablet Take 1 tablet by mouth every morning.     Multiple Vitamins-Minerals (PRESERVISION/LUTEIN PO) Take 1 capsule by mouth daily.     Omega-3 Fatty Acids (FISH OIL) 1200 MG CAPS Take 1  capsule by mouth daily.     No current facility-administered medications for this visit.    Allergies:   Penicillins, Actonel [risedronate sodium], Ceclor [cefaclor], Acetaminophen, Alendronate sodium, Celecoxib, Codeine, and Ventolin [kdc:albuterol]    ROS:  Please see the history of present illness.   Otherwise, review of systems are positive for none.   All other systems are reviewed and negative.    PHYSICAL EXAM: VS:  BP 128/66   Pulse 72   Ht '5\' 6"'$  (1.676 m)   Wt 151 lb 3.2 oz (68.6 kg)   SpO2 96%   BMI 24.40 kg/m  , BMI Body mass index is 24.4 kg/m. GENERAL:  Well appearing NECK:  No jugular venous distention, waveform within normal limits, carotid upstroke brisk and symmetric, no bruits, no thyromegaly LUNGS:  Clear to auscultation bilaterally CHEST:  Unremarkable HEART:  PMI not displaced or sustained,S1 and S2 within normal limits, no S3, no S4, no clicks, no rubs, no murmurs ABD:  Flat, positive bowel sounds normal in frequency in pitch, no bruits, no rebound, no guarding, no midline pulsatile mass, no hepatomegaly, no splenomegaly EXT:  2 plus pulses throughout, no edema, no cyanosis no clubbing   EKG:  EKG is  ordered today. The ekg ordered today demonstrates sinus rhythm, rate 72, left bundle branch block, no change from previous.   Recent Labs: 07/16/2022: ALT 17; BUN 13; Creatinine, Ser 0.59; Hemoglobin 13.7; Platelets 172; Potassium 4.4; Sodium 140; TSH 0.006    Lipid Panel    Component Value Date/Time   CHOL 160 01/18/2022 0811   CHOL 178 02/16/2013 0959   TRIG 45 01/18/2022 0811   TRIG 37 01/11/2017 1004   TRIG 33 02/16/2013 0959   HDL 95 01/18/2022 0811   HDL 84 01/11/2017 1004   HDL 94 02/16/2013 0959   CHOLHDL 1.7 01/18/2022 0811   LDLCALC 55 01/18/2022 0811   LDLCALC 63 06/24/2014 1006   LDLCALC 77 02/16/2013 0959      Wt Readings from Last 3 Encounters:  09/12/22 151 lb 3.2 oz (68.6 kg)  07/20/22 147 lb (66.7 kg)  07/16/22 147 lb (66.7  kg)      Other studies Reviewed: Additional studies/ records that were reviewed today include: Labs Review of the above records demonstrates:  Please see elsewhere in the note.     ASSESSMENT AND PLAN:  HYPERTENSION -  The blood pressure is at target.  No change in therapy.    ATRIAL FIBRILLATION -  She has had no symptomatic paroxysms of  this.  No change in therapy.    TRICUSPID REGURGITATION - It has been 4 years since I last checked an echocardiogram.  I will follow-up with an echo.   MITRAL REGURGITATION -  As above.   LBBB - This is chronic .    Current medicines are reviewed at length with the patient today.  The patient does not have concerns regarding medicines.  The following changes have been made:   None  Labs/ tests ordered today include:  None  Orders Placed This Encounter  Procedures   EKG 12-Lead   ECHOCARDIOGRAM COMPLETE      Disposition:   FU with me in follow-up with me in 12 months.     Signed, Minus Breeding, MD  09/12/2022 12:54 PM    [facility]

## 2022-09-12 ENCOUNTER — Ambulatory Visit (INDEPENDENT_AMBULATORY_CARE_PROVIDER_SITE_OTHER): Payer: PPO | Admitting: Cardiology

## 2022-09-12 ENCOUNTER — Encounter: Payer: Self-pay | Admitting: Cardiology

## 2022-09-12 VITALS — BP 128/66 | HR 72 | Ht 66.0 in | Wt 151.2 lb

## 2022-09-12 DIAGNOSIS — I340 Nonrheumatic mitral (valve) insufficiency: Secondary | ICD-10-CM | POA: Diagnosis not present

## 2022-09-12 DIAGNOSIS — I480 Paroxysmal atrial fibrillation: Secondary | ICD-10-CM

## 2022-09-12 DIAGNOSIS — I447 Left bundle-branch block, unspecified: Secondary | ICD-10-CM

## 2022-09-12 DIAGNOSIS — I361 Nonrheumatic tricuspid (valve) insufficiency: Secondary | ICD-10-CM

## 2022-09-12 NOTE — Patient Instructions (Signed)
Medication Instructions:  The current medical regimen is effective;  continue present plan and medications.  *If you need a refill on your cardiac medications before your next appointment, please call your pharmacy*  Testing/Procedures: Your physician has requested that you have an echocardiogram. Echocardiography is a painless test that uses sound waves to create images of your heart. It provides your doctor with information about the size and shape of your heart and how well your heart's chambers and valves are working. This procedure takes approximately one hour. There are no restrictions for this procedure. Please do NOT wear cologne, perfume, aftershave, or lotions (deodorant is allowed). Please arrive 15 minutes prior to your appointment time.  Follow-Up: At [facility], you and your health needs are our priority.  As part of our continuing mission to provide you with exceptional heart care, we have created designated Provider Care Teams.  These Care Teams include your primary Cardiologist (physician) and Advanced Practice Providers (APPs -  Physician Assistants and Nurse Practitioners) who all work together to provide you with the care you need, when you need it.  We recommend signing up for the patient portal called "MyChart".  Sign up information is provided on this After Visit Summary.  MyChart is used to connect with patients for Virtual Visits (Telemedicine).  Patients are able to view lab/test results, encounter notes, upcoming appointments, etc.  Non-urgent messages can be sent to your provider as well.   To learn more about what you can do with MyChart, go to [email or web address].    Your next appointment:   1 year(s)  The format for your next appointment:   In Person  Provider:   Minus Breeding, MD      Important Information About Sugar

## 2022-09-19 DIAGNOSIS — Z1231 Encounter for screening mammogram for malignant neoplasm of breast: Secondary | ICD-10-CM | POA: Diagnosis not present

## 2022-10-04 ENCOUNTER — Ambulatory Visit (HOSPITAL_COMMUNITY): Payer: PPO | Attending: Cardiology

## 2022-10-04 DIAGNOSIS — I361 Nonrheumatic tricuspid (valve) insufficiency: Secondary | ICD-10-CM | POA: Insufficient documentation

## 2022-10-04 DIAGNOSIS — I340 Nonrheumatic mitral (valve) insufficiency: Secondary | ICD-10-CM | POA: Diagnosis not present

## 2022-10-04 DIAGNOSIS — I480 Paroxysmal atrial fibrillation: Secondary | ICD-10-CM | POA: Diagnosis not present

## 2022-10-04 LAB — ECHOCARDIOGRAM COMPLETE
Area-P 1/2: 3.89 cm2
MV M vel: 4.82 m/s
MV Peak grad: 92.9 mmHg
Radius: 0.55 cm
S' Lateral: 2.6 cm

## 2022-10-12 ENCOUNTER — Other Ambulatory Visit: Payer: Self-pay | Admitting: Family Medicine

## 2022-10-12 DIAGNOSIS — E039 Hypothyroidism, unspecified: Secondary | ICD-10-CM

## 2022-10-24 DIAGNOSIS — H31093 Other chorioretinal scars, bilateral: Secondary | ICD-10-CM | POA: Diagnosis not present

## 2022-10-24 DIAGNOSIS — H16213 Exposure keratoconjunctivitis, bilateral: Secondary | ICD-10-CM | POA: Diagnosis not present

## 2022-10-24 DIAGNOSIS — Z961 Presence of intraocular lens: Secondary | ICD-10-CM | POA: Diagnosis not present

## 2022-10-24 DIAGNOSIS — H04123 Dry eye syndrome of bilateral lacrimal glands: Secondary | ICD-10-CM | POA: Diagnosis not present

## 2022-10-24 DIAGNOSIS — H43811 Vitreous degeneration, right eye: Secondary | ICD-10-CM | POA: Diagnosis not present

## 2022-10-24 DIAGNOSIS — H353131 Nonexudative age-related macular degeneration, bilateral, early dry stage: Secondary | ICD-10-CM | POA: Diagnosis not present

## 2023-01-14 ENCOUNTER — Ambulatory Visit (INDEPENDENT_AMBULATORY_CARE_PROVIDER_SITE_OTHER): Payer: PPO | Admitting: Family Medicine

## 2023-01-14 ENCOUNTER — Encounter: Payer: Self-pay | Admitting: Family Medicine

## 2023-01-14 ENCOUNTER — Telehealth: Payer: Self-pay | Admitting: Family Medicine

## 2023-01-14 VITALS — BP 135/71 | HR 77 | Ht 66.0 in | Wt 148.0 lb

## 2023-01-14 DIAGNOSIS — M810 Age-related osteoporosis without current pathological fracture: Secondary | ICD-10-CM

## 2023-01-14 DIAGNOSIS — E039 Hypothyroidism, unspecified: Secondary | ICD-10-CM

## 2023-01-14 DIAGNOSIS — I480 Paroxysmal atrial fibrillation: Secondary | ICD-10-CM

## 2023-01-14 LAB — LIPID PANEL

## 2023-01-14 NOTE — Progress Notes (Signed)
BP 135/71   Pulse 77   Ht '5\' 6"'$  (1.676 m)   Wt 148 lb (67.1 kg)   SpO2 98%   BMI 23.89 kg/m    Subjective:   Patient ID: [name], female    DOB: 1946-03-22, 77 y.o.   MRN: [id number]  HPI: [name] is a 77 y.o. female presenting on 01/14/2023 for Medical Management of Chronic Issues and Hypothyroidism   HPI Hypothyroidism recheck Patient is coming in for thyroid recheck today as well. They deny any issues with hair changes or heat or cold problems or diarrhea or constipation. They deny any chest pain or palpitations. They are currently on levothyroxine 137 micrograms   A-fib recheck Patient is coming in today for A-fib recheck.  She is on flecainide which was originally started by cardiology.  She is on aspirin but not on any other anticoagulation due to risks and choice.  Osteoporosis and vitamin D deficiency recheck.  Patient takes calcium and vitamin D for osteoporosis.  She her last bone density scan in 2021 and is due for bone density scan again.  Relevant past medical, surgical, family and social history reviewed and updated as indicated. Interim medical history since our last visit reviewed. Allergies and medications reviewed and updated.  Review of Systems  Constitutional:  Negative for chills and fever.  Eyes:  Negative for visual disturbance.  Respiratory:  Negative for chest tightness and shortness of breath.   Cardiovascular:  Negative for chest pain and leg swelling.  Musculoskeletal:  Negative for back pain and gait problem.  Skin:  Negative for rash.  Neurological:  Negative for dizziness, light-headedness and headaches.  Psychiatric/Behavioral:  Negative for agitation and behavioral problems.   All other systems reviewed and are negative.   Per HPI unless specifically indicated above   Allergies as of 01/14/2023       Reactions   Penicillins Anaphylaxis   Actonel [risedronate Sodium]    Nausea and worsened heart burn   Ceclor  [cefaclor] Hives   Acetaminophen Nausea And Vomiting, Other (See Comments)   Headache   Alendronate Sodium Nausea Only, Other (See Comments)   Heartburn, stomach upset   Celecoxib Other (See Comments)   Stomach upset, heart burn   Codeine Nausea And Vomiting, Other (See Comments)   Headache   Ventolin [kdc:albuterol] Palpitations   Rapid heart beat        Medication List        Accurate as of January 14, 2023  9:12 AM. If you have any questions, ask your nurse or doctor.          aspirin EC 325 MG tablet Take 325 mg by mouth daily.   carboxymethylcellul-glycerin 0.5-0.9 % ophthalmic solution Commonly known as: REFRESH OPTIVE Place 1 drop into both eyes in the morning and at bedtime.   Centrum Silver tablet Take 1 tablet by mouth every morning.   PRESERVISION/LUTEIN PO Take 1 capsule by mouth daily.   Fish Oil 1200 MG Caps Take 1 capsule by mouth daily.   flecainide 50 MG tablet Commonly known as: TAMBOCOR Take 1 tablet (50 mg total) by mouth 2 (two) times daily.   levothyroxine 137 MCG tablet Commonly known as: Synthroid TAKE 1 TABLET BY MOUTH DAILY BEFORE BREAKFAST.   loratadine 10 MG tablet Commonly known as: CLARITIN Take 10 mg by mouth daily as needed for allergies.   Refresh Lacri-Lube Oint Apply 1 inch to eye at bedtime. Apply to bottom lid   Vitamin  D3 50 MCG (2000 UT) Tabs Generic drug: Cholecalciferol Take 1 capsule by mouth daily.         Objective:   BP 135/71   Pulse 77   Ht '5\' 6"'$  (1.676 m)   Wt 148 lb (67.1 kg)   SpO2 98%   BMI 23.89 kg/m   Wt Readings from Last 3 Encounters:  01/14/23 148 lb (67.1 kg)  09/12/22 151 lb 3.2 oz (68.6 kg)  07/20/22 147 lb (66.7 kg)    Physical Exam Vitals and nursing note reviewed.  Constitutional:      General: She is not in acute distress.    Appearance: She is well-developed. She is not diaphoretic.  Eyes:     Conjunctiva/sclera: Conjunctivae normal.  Cardiovascular:     Rate and  Rhythm: Normal rate and regular rhythm.     Heart sounds: Normal heart sounds. No murmur heard. Pulmonary:     Effort: Pulmonary effort is normal. No respiratory distress.     Breath sounds: Normal breath sounds. No wheezing.  Musculoskeletal:        General: No swelling or tenderness. Normal range of motion.  Skin:    General: Skin is warm and dry.     Findings: No rash.  Neurological:     Mental Status: She is alert and oriented to person, place, and time.     Coordination: Coordination normal.  Psychiatric:        Behavior: Behavior normal.       Assessment & Plan:   Problem List Items Addressed This Visit       Cardiovascular and Mediastinum   Atrial fibrillation ([place])     Endocrine   Hypothyroidism - Primary   Relevant Orders   CBC with Differential/Platelet   CMP14+EGFR   Lipid panel   TSH   Cortisol     Musculoskeletal and Integument   Osteoporosis, post-menopausal   Relevant Orders   DG [facility] DEXA    She will schedule bone density in the future.  Blood pressure everything looks good, will check blood work. Follow up plan: Return in about 6 months (around 07/17/2023), or if symptoms worsen or fail to improve, for Hypothyroidism recheck.  Counseling provided for all of the vaccine components Orders Placed This Encounter  Procedures   DG [facility] DEXA   CBC with Differential/Platelet   CMP14+EGFR   Lipid panel   TSH   Cortisol    Caryl Pina, MD [facility] 01/14/2023, 9:12 AM

## 2023-01-15 LAB — CMP14+EGFR
ALT: 16 IU/L (ref 0–32)
AST: 23 IU/L (ref 0–40)
Albumin/Globulin Ratio: 1.2 (ref 1.2–2.2)
Albumin: 3.8 g/dL (ref 3.8–4.8)
Alkaline Phosphatase: 104 IU/L (ref 44–121)
BUN/Creatinine Ratio: 21 (ref 12–28)
BUN: 13 mg/dL (ref 8–27)
Bilirubin Total: 0.7 mg/dL (ref 0.0–1.2)
CO2: 25 mmol/L (ref 20–29)
Calcium: 9.8 mg/dL (ref 8.7–10.3)
Chloride: 102 mmol/L (ref 96–106)
Creatinine, Ser: 0.61 mg/dL (ref 0.57–1.00)
Globulin, Total: 3.1 g/dL (ref 1.5–4.5)
Glucose: 81 mg/dL (ref 70–99)
Potassium: 4.5 mmol/L (ref 3.5–5.2)
Sodium: 140 mmol/L (ref 134–144)
Total Protein: 6.9 g/dL (ref 6.0–8.5)
eGFR: 93 mL/min/{1.73_m2} (ref >59)

## 2023-01-15 LAB — CBC WITH DIFFERENTIAL/PLATELET
Basophils Absolute: 0 10*3/uL (ref 0.0–0.2)
Basos: 1 %
EOS (ABSOLUTE): 0.4 10*3/uL (ref 0.0–0.4)
Eos: 8 %
Hematocrit: 39.8 % (ref 34.0–46.6)
Hemoglobin: 13.5 g/dL (ref 11.1–15.9)
Immature Grans (Abs): 0 10*3/uL (ref 0.0–0.1)
Immature Granulocytes: 0 %
Lymphocytes Absolute: 1.6 10*3/uL (ref 0.7–3.1)
Lymphs: 31 %
MCH: 29.7 pg (ref 26.6–33.0)
MCHC: 33.9 g/dL (ref 31.5–35.7)
MCV: 88 fL (ref 79–97)
Monocytes Absolute: 0.5 10*3/uL (ref 0.1–0.9)
Monocytes: 10 %
Neutrophils Absolute: 2.7 10*3/uL (ref 1.4–7.0)
Neutrophils: 50 %
Platelets: 185 10*3/uL (ref 150–450)
RBC: 4.54 x10E6/uL (ref 3.77–5.28)
RDW: 12 % (ref 11.7–15.4)
WBC: 5.2 10*3/uL (ref 3.4–10.8)

## 2023-01-15 LAB — LIPID PANEL
Chol/HDL Ratio: 1.7 ratio (ref 0.0–4.4)
Cholesterol, Total: 173 mg/dL (ref 100–199)
HDL: 103 mg/dL (ref >39)
LDL Chol Calc (NIH): 60 mg/dL (ref 0–99)
Triglycerides: 45 mg/dL (ref 0–149)
VLDL Cholesterol Cal: 10 mg/dL (ref 5–40)

## 2023-01-15 LAB — TSH: TSH: 0.006 u[IU]/mL — ABNORMAL LOW (ref 0.450–4.500)

## 2023-01-15 LAB — CORTISOL: Cortisol: 21.7 ug/dL — ABNORMAL HIGH (ref 6.2–19.4)

## 2023-01-21 ENCOUNTER — Ambulatory Visit (INDEPENDENT_AMBULATORY_CARE_PROVIDER_SITE_OTHER): Payer: PPO

## 2023-01-21 DIAGNOSIS — M810 Age-related osteoporosis without current pathological fracture: Secondary | ICD-10-CM | POA: Diagnosis not present

## 2023-01-23 DIAGNOSIS — M810 Age-related osteoporosis without current pathological fracture: Secondary | ICD-10-CM | POA: Diagnosis not present

## 2023-02-19 ENCOUNTER — Telehealth: Payer: Self-pay | Admitting: Family Medicine

## 2023-02-19 NOTE — Telephone Encounter (Signed)
Called patient to schedule Medicare Annual Wellness Visit (AWV). Left message for patient to call back and schedule Medicare Annual Wellness Visit (AWV).  Patient has [employer] ADVANTAGE (Calendar) can be scheduled sooner than  07/22/2023   Last date of AWV: 07/20/2022   Please schedule an appointment at any time with either Vernona Rieger or [place], N[facility]'s. .  If any questions, please contact me at [phone].  Thank you,  Judeth Cornfield,  AMB Clinical Support [facility] AWV Program Direct Dial ??[phone]

## 2023-02-20 ENCOUNTER — Telehealth: Payer: Self-pay | Admitting: Family Medicine

## 2023-02-20 NOTE — Telephone Encounter (Signed)
Contacted [name] to schedule their annual wellness visit. Appointment made for 02/21/2023. Thank you,  Judeth Cornfield,  AMB Clinical Support [facility] AWV Program Direct Dial ??[phone]

## 2023-02-21 ENCOUNTER — Ambulatory Visit (INDEPENDENT_AMBULATORY_CARE_PROVIDER_SITE_OTHER): Payer: PPO

## 2023-02-21 VITALS — Ht 66.0 in | Wt 145.0 lb

## 2023-02-21 DIAGNOSIS — Z0000 Encounter for general adult medical examination without abnormal findings: Secondary | ICD-10-CM

## 2023-02-21 NOTE — Progress Notes (Signed)
Subjective:   [name] is a 77 y.o. female who presents for Medicare Annual (Subsequent) preventive examination. I connected with  [name] on 02/21/23 by a audio enabled telemedicine application and verified that I am speaking with the correct person using two identifiers.  Patient Location: Home  Provider Location: Home Office  I discussed the limitations of evaluation and management by telemedicine. The patient expressed understanding and agreed to proceed.  Review of Systems     Cardiac Risk Factors include: advanced age (>39men, >47 women)     Objective:    Today's Vitals   02/21/23 0947  Weight: 145 lb (65.8 kg)  Height:  (1.676 m)   Body mass index is 23.4 kg/m.     02/21/2023    9:50 AM 07/20/2022    1:41 PM 07/19/2021    2:40 PM 02/03/2020   10:27 AM 07/04/2012    8:00 PM 06/27/2012   11:46 AM  Advanced Directives  Does Patient Have a Medical Advance Directive? No No No No Patient does not have advance directive;Patient would not like information Patient does not have advance directive;Patient would not like information  Would patient like information on creating a medical advance directive? No - Patient declined No - Patient declined No - Patient declined No - Patient declined      Current Medications (verified) Outpatient Encounter Medications as of 02/21/2023  Medication Sig   Artificial Tear Ointment (REFRESH LACRI-LUBE) OINT Apply 1 inch to eye at bedtime. Apply to bottom lid   aspirin 325 MG EC tablet Take 325 mg by mouth daily.   carboxymethylcellul-glycerin (REFRESH OPTIVE) 0.5-0.9 % ophthalmic solution Place 1 drop into both eyes in the morning and at bedtime.   Cholecalciferol (VITAMIN D3) 2000 UNITS TABS Take 1 capsule by mouth daily.   flecainide (TAMBOCOR) 50 MG tablet Take 1 tablet (50 mg total) by mouth 2 (two) times daily.   levothyroxine (SYNTHROID) 137 MCG tablet TAKE 1 TABLET BY MOUTH DAILY BEFORE BREAKFAST.   loratadine  (CLARITIN) 10 MG tablet Take 10 mg by mouth daily as needed for allergies.    Multiple Vitamins-Minerals (CENTRUM SILVER) tablet Take 1 tablet by mouth every morning.   Multiple Vitamins-Minerals (PRESERVISION/LUTEIN PO) Take 1 capsule by mouth daily.   Omega-3 Fatty Acids (FISH OIL) 1200 MG CAPS Take 1 capsule by mouth daily.   No facility-administered encounter medications on file as of 02/21/2023.    Allergies (verified) Penicillins, Actonel [risedronate sodium], Ceclor [cefaclor], Acetaminophen, Alendronate sodium, Celecoxib, Codeine, and Ventolin [kdc:albuterol]   History: Past Medical History:  Diagnosis Date   Allergy    Atrial fibrillation    PT ON FLECANIDE -DID NOT WANT TO BE ON BLOOD THINNERS OTHER THAN DAILY ASA- due to adrenal gland issue - no issues since corrected    Blood transfusion without reported diagnosis    Blurry vision, right eye    HX OF RT EYE VISION DISTURBANCE 01/2012 -PT SEEN BY DR. RANKIN--ELEVATED B/P  THOUGHT TO BE RELATED TO [place] SPOTTED FEVER THOUGHT TO BE CAUSE OF VISION PROBLEM-HAD LASER OF BOTH EYES (SLIGHT DAMAGE TO LEFT  EYE).     Cataract    BILATERAL removed    GERD (gastroesophageal reflux disease)    Headache(784.0)    PAST HX MIGRAINES   Hearing loss of right ear    Heart murmur    TR    Hypertension    B/P HAS BEEN UP AND DOWN BUT MOSTLY DOWN WHILE ON  METOPROLOL -WAS TAKEN OFF METOROLOL IN NOV 2012 BY CARDIOLOGIST AND THEN IN MARCH 2013 PT'S B/P BECAME REALLY ELEVATED-SHE RESTARTED METOPROLOL  AND WAS GIVEN BENICAR.  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 hx of mets to luns, which was treated (unsure of how)   TMJ locking    PT STATES IF MOUTH OPEN EXTENDED TIME-LIKE AT DENTIST--HER JAWS LOCK   TR (tricuspid regurgitation)    Vegetarian diet    DOES EAT EGGS AND DAIRY--NO MEATS OR FISH   Past Surgical History:  Procedure Laterality Date   ADRENALECTOMY  07/03/2012   Procedure: ADRENALECTOMY;  Surgeon: Crecencio Mc, MD;  Location: [facility];  Service: Urology;  Laterality: Right;   APPENDECTOMY     Lung biopsies     THYROIDECTOMY     UPPER GASTROINTESTINAL ENDOSCOPY     WRIST SURGERY     Family History  Problem Relation Age of Onset   Kidney disease Mother    Colon polyps Father    Cancer Sister        lymphoma   Colon cancer Neg Hx    Esophageal cancer Neg Hx    Rectal cancer Neg Hx    Stomach cancer Neg Hx    Social History   Socioeconomic History   Marital status: Married    Spouse name: Konrad Dolores   Number of children: 1   Years of education: Not on file   Highest education level: Not on file  Occupational History   Occupation: retired  Tobacco Use   Smoking status: Never   Smokeless tobacco: Never   Tobacco comments:    no tobacco   Vaping Use   Vaping Use: Never used  Substance and Sexual Activity   Alcohol use: No   Drug use: No   Sexual activity: Not on file  Other Topics Concern   Not on file  Social History Narrative   Lives in [place] with husband. Used to work full time as a Conservator, museum/gallery, now works one day/week. No herbal meds. Vegetarian; no caffeine Does not exercise regularly but is very active and denies any exertional symptoms.    Daughter lives close by   Social Determinants of Health   Financial Resource Strain: Low Risk  (02/21/2023)   Overall Financial Resource Strain (CARDIA)    Difficulty of Paying Living Expenses: Not hard at all  Food Insecurity: No Food  Insecurity (02/21/2023)   Hunger Vital Sign    Worried About Running Out of Food in the Last Year: Never true    Ran Out of Food in the Last Year: Never true  Transportation Needs: No Transportation Needs (02/21/2023)   PRAPARE - Administrator, Civil Service (Medical): No    Lack of Transportation (Non-Medical): No  Physical Activity: Insufficiently Active (02/21/2023)   Exercise Vital Sign    Days of Exercise per Week: 3 days  Minutes of Exercise per Session: 30 min  Stress: No Stress Concern Present (02/21/2023)   [employer] of Occupational Health - Occupational Stress Questionnaire    Feeling of Stress : Not at all  Social Connections: Moderately Integrated (02/21/2023)   Social Connection and Isolation Panel [NHANES]    Frequency of Communication with Friends and Family: More than three times a week    Frequency of Social Gatherings with Friends and Family: More than three times a week    Attends Religious Services: More than 4 times per year    Active Member of [employer] or Organizations: No    Attends Engineer, structural: Never    Marital Status: Married    Tobacco Counseling Counseling given: Not Answered Tobacco comments: no tobacco    Clinical Intake:  Pre-visit preparation completed: Yes  Pain : No/denies pain     Nutritional Risks: None Diabetes: No  How often do you need to have someone help you when you read instructions, pamphlets, or other written materials from your doctor or pharmacy?: 1 - Never  Diabetic?no   Interpreter Needed?: No  Information entered by :: Renie Ora, LPN   Activities of Daily Living    02/21/2023    9:50 AM 07/20/2022    1:41 PM  In your present state of health, do you have any difficulty performing the following activities:  Hearing? 0 0  Vision? 0 0  Difficulty concentrating or making decisions? 0 0  Walking or climbing stairs? 0 0  Dressing or bathing? 0 0  Doing errands, shopping? 0 0   Preparing Food and eating ? N N  Using the Toilet? N N  In the past six months, have you accidently leaked urine? N N  Do you have problems with loss of bowel control? N N  Managing your Medications? N N  Managing your Finances? N N  Housekeeping or managing your Housekeeping? N N    Patient Care Team: Dettinger, Elige Radon, MD as PCP - General (Family Medicine) Rollene Rotunda, MD (Cardiology) Luciana Axe Alford Highland, MD (Ophthalmology) Ernesto Rutherford, MD (Ophthalmology)  Indicate any recent Medical Services you may have received from other than [facility] providers in the past year (date may be approximate).     Assessment:   This is a routine wellness examination for [name].  Hearing/Vision screen Vision Screening - Comments:: Wears rx glasses - up to date with routine eye exams with  Dr.Groat and Dr.Rankin   Dietary issues and exercise activities discussed: Current Exercise Habits: Home exercise routine, Type of exercise: walking, Time (Minutes): 30, Frequency (Times/Week): 3, Weekly Exercise (Minutes/Week): 90, Intensity: Mild, Exercise limited by: None identified   Goals Addressed             This Visit's Progress    DIET - INCREASE WATER INTAKE         Depression Screen    02/21/2023    9:49 AM 01/14/2023    8:55 AM 07/20/2022    1:39 PM 07/16/2022    8:33 AM 07/20/2021   10:36 AM 07/19/2021    2:34 PM 01/16/2021   10:29 AM  PHQ 2/9 Scores  PHQ - 2 Score 0 0 0 0 0 0 0  PHQ- 9 Score 0  0 0       Fall Risk    02/21/2023    9:47 AM 01/14/2023    8:55 AM 07/20/2022    1:37 PM 07/16/2022    8:33 AM 07/20/2021   10:35 AM  Fall Risk   Falls in the past year? 0 0 0 0 0  Number falls in past yr: 0  0    Injury with Fall? 0  0    Risk for fall due to : No Fall Risks  No Fall Risks    Follow up Falls prevention discussed  Falls prevention discussed      FALL RISK PREVENTION PERTAINING TO THE HOME:  Any stairs in or around the home? Yes  If so, are there any without handrails?  No  Home free of loose throw rugs in walkways, pet beds, electrical cords, etc? Yes  Adequate lighting in your home to reduce risk of falls? Yes   ASSISTIVE DEVICES UTILIZED TO PREVENT FALLS:  Life alert? No  Use of a cane, Jaskiewicz or w/c? No  Grab bars in the bathroom? No  Shower chair or bench in shower? No  Elevated toilet seat or a handicapped toilet? No       08/22/2017    2:45 PM  MMSE - Mini Mental State Exam  Orientation to time 5  Orientation to Place 5  Registration 3  Attention/ Calculation 5  Recall 3  Language- name 2 objects 2  Language- repeat 1  Language- follow 3 step command 3  Language- read & follow direction 1  Write a sentence 1  Copy design 0  Total score 29        02/21/2023    9:50 AM 07/20/2022    1:42 PM 02/03/2020   10:19 AM  6CIT Screen  What Year? 0 points 0 points 0 points  What month? 0 points 0 points 0 points  What time? 0 points 0 points 0 points  Count back from 20 0 points 0 points 0 points  Months in reverse 0 points 0 points 0 points  Repeat phrase 0 points 0 points 0 points  Total Score 0 points 0 points 0 points    Immunizations Immunization History  Administered Date(s) Administered   Fluad Quad(high Dose 65+) 08/20/2019, 08/30/2020, 08/30/2021, 08/31/2022   Influenza Whole 08/05/2010   Influenza, High Dose Seasonal PF 08/13/2016, 08/26/2017, 09/01/2018   Influenza,inj,Quad PF,6+ Mos 08/19/2013, 08/19/2014, 08/11/2015   Moderna Sars-Covid-2 Vaccination 12/17/2019, 01/15/2020, 10/03/2020   Pneumococcal Conjugate-13 10/21/2013   Pneumococcal Polysaccharide-23 11/05/1994, 08/27/2011   Td 11/05/2006   Tdap 01/11/2017   Zoster Recombinat (Shingrix) 01/15/2022, 07/16/2022    TDAP status: Up to date  Flu Vaccine status: Up to date  Pneumococcal vaccine status: Up to date  Covid-19 vaccine status: Completed vaccines  Qualifies for Shingles Vaccine? Yes   Zostavax completed Yes   Shingrix Completed?: Yes  Screening  Tests Health Maintenance  Topic Date Due   COVID-19 Vaccine (4 - 2023-24 season) 07/06/2022   PAP SMEAR-Modifier  08/29/2022   INFLUENZA VACCINE  06/06/2023   MAMMOGRAM  09/20/2023   Medicare Annual Wellness (AWV)  02/21/2024   COLONOSCOPY (Pts 45-12yrs Insurance coverage will need to be confirmed)  04/26/2024   DEXA SCAN  01/22/2025   DTaP/Tdap/Td (3 - Td or Tdap) 01/12/2027   Pneumonia Vaccine 60+ Years old  Completed   Hepatitis C Screening  Completed   Zoster Vaccines- Shingrix  Completed   HPV VACCINES  Aged Out   Fecal DNA (Cologuard)  Discontinued    Health Maintenance  Health Maintenance Due  Topic Date Due   COVID-19 Vaccine (4 - 2023-24 season) 07/06/2022   PAP SMEAR-Modifier  08/29/2022    Colorectal cancer screening: Type of screening:  Colonoscopy. Completed 04/06/2021. Repeat every 3 years  Mammogram status: Completed 09/19/2022. Repeat every year  Bone Density status: Completed 01/23/2023. Results reflect: Bone density results: OSTEOPOROSIS. Repeat every 2 years.  Lung Cancer Screening: (Low Dose CT Chest recommended if Age 50-80 years, 30 pack-year currently smoking OR have quit w/in 15years.) does not qualify.   Lung Cancer Screening Referral: n/a  Additional Screening:  Hepatitis C Screening: does not qualify;   Vision Screening: Recommended annual ophthalmology exams for early detection of glaucoma and other disorders of the eye. Is the patient up to date with their annual eye exam?  Yes  Who is the provider or what is the name of the office in which the patient attends annual eye exams? Dr.Rankin  If pt is not established with a provider, would they like to be referred to a provider to establish care? No .   Dental Screening: Recommended annual dental exams for proper oral hygiene  Community Resource Referral / Chronic Care Management: CRR required this visit?  No   CCM required this visit?  No      Plan:     I have personally reviewed and  noted the following in the patient's chart:   Medical and social history Use of alcohol, tobacco or illicit drugs  Current medications and supplements including opioid prescriptions. Patient is not currently taking opioid prescriptions. Functional ability and status Nutritional status Physical activity Advanced directives List of other physicians Hospitalizations, surgeries, and ER visits in previous 12 months Vitals Screenings to include cognitive, depression, and falls Referrals and appointments  In addition, I have reviewed and discussed with patient certain preventive protocols, quality metrics, and best practice recommendations. A written personalized care plan for preventive services as well as general preventive health recommendations were provided to patient.     Lorrene Reid, LPN   1/61/0960   Nurse Notes: none

## 2023-02-21 NOTE — Patient Instructions (Signed)
[name] , Thank you for taking time to come for your Medicare Wellness Visit. I appreciate your ongoing commitment to your health goals. Please review the following plan we discussed and let me know if I can assist you in the future.   These are the goals we discussed:  Goals      DIET - INCREASE WATER INTAKE     Exercise 150 minutes per week (moderate activity)     Hope to stay as healthy and independent as you are now        This is a list of the screening recommended for you and due dates:  Health Maintenance  Topic Date Due   COVID-19 Vaccine (4 - 2023-24 season) 07/06/2022   Pap Smear  08/29/2022   Flu Shot  06/06/2023   Mammogram  09/20/2023   Medicare Annual Wellness Visit  02/21/2024   Colon Cancer Screening  04/26/2024   DEXA scan (bone density measurement)  01/22/2025   DTaP/Tdap/Td vaccine (3 - Td or Tdap) 01/12/2027   Pneumonia Vaccine  Completed   Hepatitis C Screening: USPSTF Recommendation to screen - Ages 51-79 yo.  Completed   Zoster (Shingles) Vaccine  Completed   HPV Vaccine  Aged Out   Cologuard (Stool DNA test)  Discontinued    Advanced directives: Advance directive discussed with you today. I have provided a copy for you to complete at home and have notarized. Once this is complete please bring a copy in to our office so we can scan it into your chart.   Conditions/risks identified: Aim for 30 minutes of exercise or brisk walking, 6-8 glasses of water, and 5 servings of fruits and vegetables each day.   Next appointment: Follow up in one year for your annual wellness visit    Preventive Care 65 Years and Older, Female Preventive care refers to lifestyle choices and visits with your health care provider that can promote health and wellness. What does preventive care include? A yearly physical exam. This is also called an annual well check. Dental exams once or twice a year. Routine eye exams. Ask your health care provider how often you should have  your eyes checked. Personal lifestyle choices, including: Daily care of your teeth and gums. Regular physical activity. Eating a healthy diet. Avoiding tobacco and drug use. Limiting alcohol use. Practicing safe sex. Taking low-dose aspirin every day. Taking vitamin and mineral supplements as recommended by your health care provider. What happens during an annual well check? The services and screenings done by your health care provider during your annual well check will depend on your age, overall health, lifestyle risk factors, and family history of disease. Counseling  Your health care provider may ask you questions about your: Alcohol use. Tobacco use. Drug use. Emotional well-being. Home and relationship well-being. Sexual activity. Eating habits. History of falls. Memory and ability to understand (cognition). Work and work Astronomer. Reproductive health. Screening  You may have the following tests or measurements: Height, weight, and BMI. Blood pressure. Lipid and cholesterol levels. These may be checked every 5 years, or more frequently if you are over 34 years old. Skin check. Lung cancer screening. You may have this screening every year starting at age 49 if you have a 30-pack-year history of smoking and currently smoke or have quit within the past 15 years. Fecal occult blood test (FOBT) of the stool. You may have this test every year starting at age 28. Flexible sigmoidoscopy or colonoscopy. You may have a sigmoidoscopy  every 5 years or a colonoscopy every 10 years starting at age 25. Hepatitis C blood test. Hepatitis B blood test. Sexually transmitted disease (STD) testing. Diabetes screening. This is done by checking your blood sugar (glucose) after you have not eaten for a while (fasting). You may have this done every 1-3 years. Bone density scan. This is done to screen for osteoporosis. You may have this done starting at age 23. Mammogram. This may be done every  1-2 years. Talk to your health care provider about how often you should have regular mammograms. Talk with your health care provider about your test results, treatment options, and if necessary, the need for more tests. Vaccines  Your health care provider may recommend certain vaccines, such as: Influenza vaccine. This is recommended every year. Tetanus, diphtheria, and acellular pertussis (Tdap, Td) vaccine. You may need a Td booster every 10 years. Zoster vaccine. You may need this after age 58. Pneumococcal 13-valent conjugate (PCV13) vaccine. One dose is recommended after age 45. Pneumococcal polysaccharide (PPSV23) vaccine. One dose is recommended after age 93. Talk to your health care provider about which screenings and vaccines you need and how often you need them. This information is not intended to replace advice given to you by your health care provider. Make sure you discuss any questions you have with your health care provider. Document Released: 11/18/2015 Document Revised: 07/11/2016 Document Reviewed: 08/23/2015 Elsevier Interactive Patient Education  2017 [place] Prevention in the Home Falls can cause injuries. They can happen to people of all ages. There are many things you can do to make your home safe and to help prevent falls. What can I do on the outside of my home? Regularly fix the edges of walkways and driveways and fix any cracks. Remove anything that might make you trip as you walk through a door, such as a raised step or threshold. Trim any bushes or trees on the path to your home. Use bright outdoor lighting. Clear any walking paths of anything that might make someone trip, such as rocks or tools. Regularly check to see if handrails are loose or broken. Make sure that both sides of any steps have handrails. Any raised decks and porches should have guardrails on the edges. Have any leaves, snow, or ice cleared regularly. Use sand or salt on walking  paths during winter. Clean up any spills in your garage right away. This includes oil or grease spills. What can I do in the bathroom? Use night lights. Install grab bars by the toilet and in the tub and shower. Do not use towel bars as grab bars. Use non-skid mats or decals in the tub or shower. If you need to sit down in the shower, use a plastic, non-slip stool. Keep the floor dry. Clean up any water that spills on the floor as soon as it happens. Remove soap buildup in the tub or shower regularly. Attach bath mats securely with double-sided non-slip rug tape. Do not have throw rugs and other things on the floor that can make you trip. What can I do in the bedroom? Use night lights. Make sure that you have a light by your bed that is easy to reach. Do not use any sheets or blankets that are too big for your bed. They should not hang down onto the floor. Have a firm chair that has side arms. You can use this for support while you get dressed. Do not have throw rugs and other things  on the floor that can make you trip. What can I do in the kitchen? Clean up any spills right away. Avoid walking on wet floors. Keep items that you use a lot in easy-to-reach places. If you need to reach something above you, use a strong step stool that has a grab bar. Keep electrical cords out of the way. Do not use floor polish or wax that makes floors slippery. If you must use wax, use non-skid floor wax. Do not have throw rugs and other things on the floor that can make you trip. What can I do with my stairs? Do not leave any items on the stairs. Make sure that there are handrails on both sides of the stairs and use them. Fix handrails that are broken or loose. Make sure that handrails are as long as the stairways. Check any carpeting to make sure that it is firmly attached to the stairs. Fix any carpet that is loose or worn. Avoid having throw rugs at the top or bottom of the stairs. If you do have  throw rugs, attach them to the floor with carpet tape. Make sure that you have a light switch at the top of the stairs and the bottom of the stairs. If you do not have them, ask someone to add them for you. What else can I do to help prevent falls? Wear shoes that: Do not have high heels. Have rubber bottoms. Are comfortable and fit you well. Are closed at the toe. Do not wear sandals. If you use a stepladder: Make sure that it is fully opened. Do not climb a closed stepladder. Make sure that both sides of the stepladder are locked into place. Ask someone to hold it for you, if possible. Clearly mark and make sure that you can see: Any grab bars or handrails. First and last steps. Where the edge of each step is. Use tools that help you move around (mobility aids) if they are needed. These include: Canes. Walkers. Scooters. Crutches. Turn on the lights when you go into a dark area. Replace any light bulbs as soon as they burn out. Set up your furniture so you have a clear path. Avoid moving your furniture around. If any of your floors are uneven, fix them. If there are any pets around you, be aware of where they are. Review your medicines with your doctor. Some medicines can make you feel dizzy. This can increase your chance of falling. Ask your doctor what other things that you can do to help prevent falls. This information is not intended to replace advice given to you by your health care provider. Make sure you discuss any questions you have with your health care provider. Document Released: 08/18/2009 Document Revised: 03/29/2016 Document Reviewed: 11/26/2014 Elsevier Interactive Patient Education  2017 [employer].

## 2023-04-06 ENCOUNTER — Other Ambulatory Visit: Payer: Self-pay | Admitting: Family Medicine

## 2023-04-06 DIAGNOSIS — I480 Paroxysmal atrial fibrillation: Secondary | ICD-10-CM

## 2023-04-15 ENCOUNTER — Encounter (INDEPENDENT_AMBULATORY_CARE_PROVIDER_SITE_OTHER): Payer: PPO | Admitting: Ophthalmology

## 2023-04-15 ENCOUNTER — Encounter (INDEPENDENT_AMBULATORY_CARE_PROVIDER_SITE_OTHER): Payer: Self-pay

## 2023-04-15 DIAGNOSIS — H353131 Nonexudative age-related macular degeneration, bilateral, early dry stage: Secondary | ICD-10-CM | POA: Diagnosis not present

## 2023-04-15 DIAGNOSIS — H35062 Retinal vasculitis, left eye: Secondary | ICD-10-CM | POA: Diagnosis not present

## 2023-04-15 DIAGNOSIS — H348112 Central retinal vein occlusion, right eye, stable: Secondary | ICD-10-CM | POA: Diagnosis not present

## 2023-05-30 ENCOUNTER — Ambulatory Visit (INDEPENDENT_AMBULATORY_CARE_PROVIDER_SITE_OTHER): Payer: PPO | Admitting: Nurse Practitioner

## 2023-05-30 ENCOUNTER — Encounter: Payer: Self-pay | Admitting: Nurse Practitioner

## 2023-05-30 VITALS — BP 111/64 | HR 80 | Temp 97.4°F | Ht 66.0 in | Wt 148.0 lb

## 2023-05-30 DIAGNOSIS — M542 Cervicalgia: Secondary | ICD-10-CM | POA: Insufficient documentation

## 2023-05-30 NOTE — Progress Notes (Signed)
Established Patient Office Visit  Subjective   Patient ID: [name], female    DOB: 1945/11/30  Age: 77 y.o. MRN: [id number]  Chief Complaint  Patient presents with   Neck Pain    Says neck has been stiff 6-8 weeks. Was trimming hedges and the next day neck became stiff   Shoulder Pain    Says both shoulders have stiff 6-8 weeks.    HPI SUBJECTIVE:  [name] is a 77 y.o. female who complains of an injury causing neck pain 6 week(s) ago. The pain is positional with movement of neck without radiation of pain down the arms. Mechanism of injury: trimming shrubs with heaving electrical trimmer. Symptoms have been acute since that time. Prior history of neck problems: no prior neck problems. There is no numbness, tingling, weakness in the arms. " The pain is minimal, but it is the stiffness". Denies fever   Patient Active Problem List   Diagnosis Date Noted   Neck pain, acute 05/30/2023   Early stage nonexudative age-related macular degeneration of both eyes 04/12/2022   Retinal vasculitis of left eye 04/06/2021   LBBB (left bundle branch block) 08/29/2020   Non-rheumatic mitral regurgitation 08/29/2020   Central retinal vein occlusion of right eye 03/21/2020   Non-rheumatic tricuspid valve insufficiency 03/06/2017   Personal history of pheochromocytoma 02/17/2014   Vitamin D deficiency 10/21/2013   Benign tumor of adrenal gland 10/21/2013   Hypothyroidism 10/21/2013   History of thyroid cancer 06/18/2013   Sinus tachycardia 07/06/2012   Atrial fibrillation (HCC) 04/04/2010   Mitral valve disorder 03/28/2010   Osteoporosis, post-menopausal 03/28/2010   Past Medical History:  Diagnosis Date   Allergy    Atrial fibrillation (HCC)    PT ON FLECANIDE -DID NOT WANT TO BE ON BLOOD THINNERS OTHER THAN DAILY ASA- due to adrenal gland issue - no issues since corrected    Blood transfusion without reported diagnosis    Blurry vision, right eye    HX OF RT EYE VISION  DISTURBANCE 01/2012 -PT SEEN BY DR. RANKIN--ELEVATED B/P  THOUGHT TO BE RELATED TO [place] SPOTTED FEVER THOUGHT TO BE CAUSE OF VISION PROBLEM-HAD LASER OF BOTH EYES (SLIGHT DAMAGE TO LEFT  EYE).     Cataract    BILATERAL removed    GERD (gastroesophageal reflux disease)    Headache(784.0)    PAST HX MIGRAINES   Hearing loss of right ear    Heart murmur    TR    Hypertension    B/P HAS BEEN UP AND DOWN BUT MOSTLY DOWN WHILE ON METOPROLOL -WAS TAKEN OFF METOROLOL IN NOV 2012 BY CARDIOLOGIST AND THEN IN MARCH 2013 PT'S B/P BECAME REALLY ELEVATED-SHE RESTARTED METOPROLOL  AND WAS GIVEN BENICAR.  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 ON RIGHT ADRENAL -THOUGHT TO BE PHEOCHROMOCYTOMA   Hypothyroidism    Mitral valve prolapse    per pt report, not listed on echo   MR (mitral regurgitation)    none on echo in 2013   Osteoarthritis    LITTLE FINGER LEFT HAND   Palpitations JUNE 2012   piror to diagnosis of SVT  -NO PROBLMS WITH REOCCURANCE   Pheochromocytoma    PONV (postoperative nausea and vomiting)    HX OF N&V AFTER SURGERIES-EXCEPT NO PROBLEM AFTER WRIST SURGERY IN 2012-AT [facility]   Thyroid cancer [facility])    age 56; s/p resection with subsequent hypothyroidism. hx of mets to luns, which was treated (unsure of how)   TMJ locking  PT STATES IF MOUTH OPEN EXTENDED TIME-LIKE AT DENTIST--HER JAWS LOCK   TR (tricuspid regurgitation)    Vegetarian diet    DOES EAT EGGS AND DAIRY--NO MEATS OR FISH   Past Surgical History:  Procedure Laterality Date   ADRENALECTOMY  07/03/2012   Procedure: ADRENALECTOMY;  Surgeon: Crecencio Mc, MD;  Location: [facility];  Service: Urology;  Laterality: Right;   APPENDECTOMY     Lung biopsies     THYROIDECTOMY     UPPER GASTROINTESTINAL ENDOSCOPY     WRIST SURGERY     Social History   Tobacco Use   Smoking status: Never   Smokeless tobacco: Never   Tobacco comments:    no tobacco   Vaping Use   Vaping status: Never Used  Substance Use Topics   Alcohol use: No    Drug use: No   Social History   Socioeconomic History   Marital status: Married    Spouse name: Konrad Dolores   Number of children: 1   Years of education: Not on file   Highest education level: Not on file  Occupational History   Occupation: retired  Tobacco Use   Smoking status: Never   Smokeless tobacco: Never   Tobacco comments:    no tobacco   Vaping Use   Vaping status: Never Used  Substance and Sexual Activity   Alcohol use: No   Drug use: No   Sexual activity: Not on file  Other Topics Concern   Not on file  Social History Narrative   Lives in [place] with husband. Used to work full time as a Conservator, museum/gallery, now works one day/week. No herbal meds. Vegetarian; no caffeine Does not exercise regularly but is very active and denies any exertional symptoms.    Daughter lives close by   Social Determinants of Health   Financial Resource Strain: Low Risk  (02/21/2023)   Overall Financial Resource Strain (CARDIA)    Difficulty of Paying Living Expenses: Not hard at all  Food Insecurity: No Food Insecurity (02/21/2023)   Hunger Vital Sign    Worried About Running Out of Food in the Last Year: Never true    Ran Out of Food in the Last Year: Never true  Transportation Needs: No Transportation Needs (02/21/2023)   PRAPARE - Administrator, Civil Service (Medical): No    Lack of Transportation (Non-Medical): No  Physical Activity: Insufficiently Active (02/21/2023)   Exercise Vital Sign    Days of Exercise per Week: 3 days    Minutes of Exercise per Session: 30 min  Stress: No Stress Concern Present (02/21/2023)   [employer] of Occupational Health - Occupational Stress Questionnaire    Feeling of Stress : Not at all  Social Connections: Moderately Integrated (02/21/2023)   Social Connection and Isolation Panel [NHANES]    Frequency of Communication with Friends and Family: More than three times a week    Frequency of Social Gatherings with Friends and Family: More  than three times a week    Attends Religious Services: More than 4 times per year    Active Member of [employer] or Organizations: No    Attends Banker Meetings: Never    Marital Status: Married  Catering manager Violence: Not At Risk (02/21/2023)   Humiliation, Afraid, Rape, and Kick questionnaire    Fear of Current or Ex-Partner: No    Emotionally Abused: No    Physically Abused: No    Sexually Abused: No   Family Status  Relation Name Status   Mother  Deceased at age 52   Father  Deceased at age 76       car accident   MGM  Deceased   MGF  Deceased   PGM  Deceased   PGF  Deceased   Sister  Deceased   Daughter  Alive   Neg Hx  (Not Specified)  No partnership data on file   Family History  Problem Relation Age of Onset   Kidney disease Mother    Colon polyps Father    Cancer Sister        lymphoma   Colon cancer Neg Hx    Esophageal cancer Neg Hx    Rectal cancer Neg Hx    Stomach cancer Neg Hx    Allergies  Allergen Reactions   Penicillins Anaphylaxis   Actonel [Risedronate Sodium]     Nausea and worsened heart burn   Ceclor [Cefaclor] Hives   Acetaminophen Nausea And Vomiting and Other (See Comments)    Headache   Alendronate Sodium Nausea Only and Other (See Comments)    Heartburn, stomach upset   Celecoxib Other (See Comments)    Stomach upset, heart burn   Codeine Nausea And Vomiting and Other (See Comments)    Headache   Ventolin [Kdc:Albuterol] Palpitations    Rapid heart beat      Review of Systems  Constitutional:  Negative for chills, fever and malaise/fatigue.  Respiratory:  Negative for cough and wheezing.   Cardiovascular:  Negative for chest pain and leg swelling.  Musculoskeletal:  Positive for neck pain.  Neurological:  Negative for dizziness, weakness and headaches.  Endo/Heme/Allergies:  Negative for polydipsia. Does not bruise/bleed easily.   Negative unless indicated in HPI   Objective:     BP 111/64   Pulse 80   Temp  (!) 97.4 F (36.3 C) (Temporal)   Ht 5\' 6"  (1.676 m)   Wt 148 lb (67.1 kg)   SpO2 100%   BMI 23.89 kg/m  BP Readings from Last 3 Encounters:  05/30/23 111/64  01/14/23 135/71  09/12/22 128/66   Wt Readings from Last 3 Encounters:  05/30/23 148 lb (67.1 kg)  02/21/23 145 lb (65.8 kg)  01/14/23 148 lb (67.1 kg)      Physical Exam Vitals and nursing note reviewed.  Constitutional:      Appearance: Normal appearance. She is normal weight.  HENT:     Head: Normocephalic and atraumatic.  Neck:     Comments: Negative Lhermitte's sign Cardiovascular:     Rate and Rhythm: Normal rate and regular rhythm.  Pulmonary:     Effort: Pulmonary effort is normal.     Breath sounds: Normal breath sounds.  Musculoskeletal:        General: Normal range of motion.     Cervical back: No rigidity or crepitus. Pain with movement present.     Right lower leg: No edema.     Left lower leg: No edema.  Skin:    General: Skin is warm and dry.     Coloration: Skin is not jaundiced.     Findings: No bruising or rash.  Neurological:     General: No focal deficit present.     Mental Status: She is alert and oriented to person, place, and time. Mental status is at baseline.  Psychiatric:        Mood and Affect: Mood normal.        Behavior: Behavior normal.  Thought Content: Thought content normal.        Judgment: Judgment normal.    No results found for any visits on 05/30/23.  Last CBC Lab Results  Component Value Date   WBC 5.2 01/14/2023   HGB 13.5 01/14/2023   HCT 39.8 01/14/2023   MCV 88 01/14/2023   MCH 29.7 01/14/2023   RDW 12.0 01/14/2023   PLT 185 01/14/2023   Last metabolic panel Lab Results  Component Value Date   GLUCOSE 81 01/14/2023   NA 140 01/14/2023   K 4.5 01/14/2023   CL 102 01/14/2023   CO2 25 01/14/2023   BUN 13 01/14/2023   CREATININE 0.61 01/14/2023   EGFR 93 01/14/2023   CALCIUM 9.8 01/14/2023   PROT 6.9 01/14/2023   ALBUMIN 3.8 01/14/2023    LABGLOB 3.1 01/14/2023   AGRATIO 1.2 01/14/2023   BILITOT 0.7 01/14/2023   ALKPHOS 104 01/14/2023   AST 23 01/14/2023   ALT 16 01/14/2023   Last lipids Lab Results  Component Value Date   CHOL 173 01/14/2023   HDL 103 01/14/2023   LDLCALC 60 01/14/2023   TRIG 45 01/14/2023   CHOLHDL 1.7 01/14/2023        Assessment & Plan:  Neck pain, acute    Return for as already scheduled with Dr, D.  ASSESSMENT:  strain and cervical strain  PLAN: rest the injured area as much as practical, apply heat (warned not to sleep on heating pad) Continue OTC  Aleve (with food) Consider Physical Therapy and XRay studies if not improving.  Call or return to clinic prn if these symptoms worsen or fail to improve as anticipated.   Plan of care discuss with client   [facility] Santa Lighter, DNP [facility] [address] [place], [place] [address] [phone]

## 2023-05-30 NOTE — Patient Instructions (Signed)
Neck Exercises Ask your health care provider which exercises are safe for you. Do exercises exactly as told by your health care provider and adjust them as directed. It is normal to feel mild stretching, pulling, tightness, or discomfort as you do these exercises. Stop right away if you feel sudden pain or your pain gets worse. Do not begin these exercises until told by your health care provider. Neck exercises can be important for many reasons. They can improve strength and maintain flexibility in your neck, which will help your upper back and prevent neck pain. Stretching exercises Rotation neck stretching  Sit in a chair or stand up. Place your feet flat on the floor, shoulder-width apart. Slowly turn your head (rotate) to the right until a slight stretch is felt. Turn it all the way to the right so you can look over your right shoulder. Do not tilt or tip your head. Hold this position for 10-30 seconds. Slowly turn your head (rotate) to the left until a slight stretch is felt. Turn it all the way to the left so you can look over your left shoulder. Do not tilt or tip your head. Hold this position for 10-30 seconds. Repeat __________ times. Complete this exercise __________ times a day. Neck retraction  Sit in a sturdy chair or stand up. Look straight ahead. Do not bend your neck. Use your fingers to push your chin backward (retraction). Do not bend your neck for this movement. Continue to face straight ahead. If you are doing the exercise properly, you will feel a slight sensation in your throat and a stretch at the back of your neck. Hold the stretch for 1-2 seconds. Repeat __________ times. Complete this exercise __________ times a day. Strengthening exercises Neck press  Lie on your back on a firm bed or on the floor with a pillow under your head. Use your neck muscles to push your head down on the pillow and straighten your spine. Hold the position as well as you can. Keep your head  facing up (in a neutral position) and your chin tucked. Slowly count to 5 while holding this position. Repeat __________ times. Complete this exercise __________ times a day. Isometrics These are exercises in which you strengthen the muscles in your neck while keeping your neck still (isometrics). Sit in a supportive chair and place your hand on your forehead. Keep your head and face facing straight ahead. Do not flex or extend your neck while doing isometrics. Push forward with your head and neck while pushing back with your hand. Hold for 10 seconds. Do the sequence again, this time putting your hand against the back of your head. Use your head and neck to push backward against the hand pressure. Finally, do the same exercise on either side of your head, pushing sideways against the pressure of your hand. Repeat __________ times. Complete this exercise __________ times a day. Prone head lifts  Lie face-down (prone position), resting on your elbows so that your chest and upper back are raised. Start with your head facing downward, near your chest. Position your chin either on or near your chest. Slowly lift your head upward. Lift until you are looking straight ahead. Then continue lifting your head as far back as you can comfortably stretch. Hold your head up for 5 seconds. Then slowly lower it to your starting position. Repeat __________ times. Complete this exercise __________ times a day. Supine head lifts  Lie on your back (supine position), bending your knees   to point to the ceiling and keeping your feet flat on the floor. Lift your head slowly off the floor, raising your chin toward your chest. Hold for 5 seconds. Repeat __________ times. Complete this exercise __________ times a day. Scapular retraction  Stand with your arms at your sides. Look straight ahead. Slowly pull both shoulders (scapulae) backward and downward (retraction) until you feel a stretch between your shoulder  blades in your upper back. Hold for 10-30 seconds. Relax and repeat. Repeat __________ times. Complete this exercise __________ times a day. Contact a health care provider if: Your neck pain or discomfort gets worse when you do an exercise. Your neck pain or discomfort does not improve within 2 hours after you exercise. If you have any of these problems, stop exercising right away. Do not do the exercises again unless your health care provider says that you can. Get help right away if: You develop sudden, severe neck pain. If this happens, stop exercising right away. Do not do the exercises again unless your health care provider says that you can. This information is not intended to replace advice given to you by your health care provider. Make sure you discuss any questions you have with your health care provider. Document Revised: 04/18/2021 Document Reviewed: 04/18/2021 Elsevier Patient Education  2024 Elsevier Inc.  

## 2023-07-06 ENCOUNTER — Other Ambulatory Visit: Payer: Self-pay | Admitting: Family Medicine

## 2023-07-06 DIAGNOSIS — I480 Paroxysmal atrial fibrillation: Secondary | ICD-10-CM

## 2023-07-06 DIAGNOSIS — E039 Hypothyroidism, unspecified: Secondary | ICD-10-CM

## 2023-07-17 ENCOUNTER — Ambulatory Visit (INDEPENDENT_AMBULATORY_CARE_PROVIDER_SITE_OTHER): Payer: PPO | Admitting: Family Medicine

## 2023-07-17 ENCOUNTER — Encounter: Payer: Self-pay | Admitting: Family Medicine

## 2023-07-17 VITALS — BP 116/68 | HR 78 | Ht 66.0 in | Wt 144.0 lb

## 2023-07-17 DIAGNOSIS — Z85850 Personal history of malignant neoplasm of thyroid: Secondary | ICD-10-CM | POA: Diagnosis not present

## 2023-07-17 DIAGNOSIS — E039 Hypothyroidism, unspecified: Secondary | ICD-10-CM

## 2023-07-17 DIAGNOSIS — I480 Paroxysmal atrial fibrillation: Secondary | ICD-10-CM | POA: Diagnosis not present

## 2023-07-17 DIAGNOSIS — S161XXD Strain of muscle, fascia and tendon at neck level, subsequent encounter: Secondary | ICD-10-CM

## 2023-07-17 MED ORDER — FLECAINIDE ACETATE 50 MG PO TABS: 50.0000 mg | ORAL_TABLET | Freq: Two times a day (BID) | ORAL | 3 refills | Status: DC

## 2023-07-17 MED ORDER — LEVOTHYROXINE SODIUM 137 MCG PO TABS: ORAL_TABLET | ORAL | 3 refills | Status: DC

## 2023-07-17 NOTE — Progress Notes (Signed)
BP 116/68   Pulse 78   Ht 5\' 6"  (1.676 m)   Wt 144 lb (65.3 kg)   SpO2 96%   BMI 23.24 kg/m    Subjective:   Patient ID: [name], female    DOB: November 03, 1946, 77 y.o.   MRN: [id number]  HPI: [name] is a 77 y.o. female presenting on 07/17/2023 for Medical Management of Chronic Issues, Hypothyroidism, and Neck Pain   HPI Hypothyroidism recheck Patient is coming in for thyroid recheck today as well. They deny any issues with hair changes or heat or cold problems or diarrhea or constipation. They deny any chest pain or palpitations. They are currently on levothyroxine 137 micrograms   .  A-fib recheck Patient sees cardiology for A-fib.  She is currently on flecainide which was recommended from them and is being monitored.  She denies any chest pain or palpitations.  Patient had a neck sprain from overworking in her garden and still feels some tightness in her neck and has been doing some stretches and exercises and is better but is just taking well.  Relevant past medical, surgical, family and social history reviewed and updated as indicated. Interim medical history since our last visit reviewed. Allergies and medications reviewed and updated.  Review of Systems  Constitutional:  Negative for chills and fever.  Eyes:  Negative for redness and visual disturbance.  Respiratory:  Negative for chest tightness and shortness of breath.   Cardiovascular:  Negative for chest pain and leg swelling.  Genitourinary:  Negative for difficulty urinating and dysuria.  Musculoskeletal:  Positive for myalgias and neck pain. Negative for back pain and gait problem.  Skin:  Negative for rash.  Neurological:  Negative for light-headedness and headaches.  Psychiatric/Behavioral:  Negative for agitation and behavioral problems.   All other systems reviewed and are negative.   Per HPI unless specifically indicated above   Allergies as of 07/17/2023       Reactions   Penicillins  Anaphylaxis   Actonel [risedronate Sodium]    Nausea and worsened heart burn   Ceclor [cefaclor] Hives   Acetaminophen Nausea And Vomiting, Other (See Comments)   Headache   Alendronate Sodium Nausea Only, Other (See Comments)   Heartburn, stomach upset   Celecoxib Other (See Comments)   Stomach upset, heart burn   Codeine Nausea And Vomiting, Other (See Comments)   Headache   Ventolin [kdc:albuterol] Palpitations   Rapid heart beat        Medication List        Accurate as of July 17, 2023  9:09 AM. If you have any questions, ask your nurse or doctor.          aspirin EC 325 MG tablet Take 325 mg by mouth daily.   carboxymethylcellul-glycerin 0.5-0.9 % ophthalmic solution Commonly known as: REFRESH OPTIVE Place 1 drop into both eyes in the morning and at bedtime.   Centrum Silver tablet Take 1 tablet by mouth every morning.   PRESERVISION/LUTEIN PO Take 1 capsule by mouth daily.   Fish Oil 1200 MG Caps Take 1 capsule by mouth daily.   flecainide 50 MG tablet Commonly known as: TAMBOCOR Take 1 tablet (50 mg total) by mouth 2 (two) times daily.   levothyroxine 137 MCG tablet Commonly known as: Synthroid TAKE 1 TABLET BY MOUTH DAILY BEFORE BREAKFAST.   loratadine 10 MG tablet Commonly known as: CLARITIN Take 10 mg by mouth daily as needed for allergies.   Refresh Lacri-Lube  Oint Apply 1 inch to eye at bedtime. Apply to bottom lid   Vitamin D3 50 MCG (2000 UT) Tabs Take 1 capsule by mouth daily.         Objective:   BP 116/68   Pulse 78   Ht 5\' 6"  (1.676 m)   Wt 144 lb (65.3 kg)   SpO2 96%   BMI 23.24 kg/m   Wt Readings from Last 3 Encounters:  07/17/23 144 lb (65.3 kg)  05/30/23 148 lb (67.1 kg)  02/21/23 145 lb (65.8 kg)    Physical Exam Vitals and nursing note reviewed.  Constitutional:      General: She is not in acute distress.    Appearance: She is well-developed. She is not diaphoretic.  Eyes:     Conjunctiva/sclera:  Conjunctivae normal.  Cardiovascular:     Rate and Rhythm: Normal rate and regular rhythm.     Heart sounds: Normal heart sounds. No murmur heard. Pulmonary:     Effort: Pulmonary effort is normal. No respiratory distress.     Breath sounds: Normal breath sounds. No wheezing.  Musculoskeletal:        General: Tenderness (Neck muscle tightness on both sides) present. Normal range of motion.  Skin:    General: Skin is warm and dry.     Findings: No rash.  Neurological:     Mental Status: She is alert and oriented to person, place, and time.     Coordination: Coordination normal.  Psychiatric:        Behavior: Behavior normal.       Assessment & Plan:   Problem List Items Addressed This Visit       Cardiovascular and Mediastinum   Atrial fibrillation (HCC)   Relevant Medications   flecainide (TAMBOCOR) 50 MG tablet   Other Relevant Orders   CBC with Differential/Platelet   CMP14+EGFR   Lipid panel     Endocrine   Hypothyroidism - Primary   Relevant Medications   levothyroxine (SYNTHROID) 137 MCG tablet   Other Relevant Orders   Lipid panel   TSH     Other   History of thyroid cancer   Relevant Orders   TSH   Other Visit Diagnoses     Strain of neck muscle, subsequent encounter           Continue stretches and exercises, gave a few more to do.  If she still not doing well then we consider physical therapy in the future for her neck.  Other medications seem to be doing well, no changes Follow up plan: Return in about 6 months (around 01/14/2024), or if symptoms worsen or fail to improve, for A-fib and thyroid.  Counseling provided for all of the vaccine components Orders Placed This Encounter  Procedures   CBC with Differential/Platelet   CMP14+EGFR   Lipid panel   TSH    Arville Care, MD [facility] 07/17/2023, 9:09 AM

## 2023-07-18 LAB — LIPID PANEL
Chol/HDL Ratio: 1.7 ratio (ref 0.0–4.4)
Cholesterol, Total: 167 mg/dL (ref 100–199)
HDL: 97 mg/dL (ref >39)
LDL Chol Calc (NIH): 60 mg/dL (ref 0–99)
Triglycerides: 47 mg/dL (ref 0–149)
VLDL Cholesterol Cal: 10 mg/dL (ref 5–40)

## 2023-07-18 LAB — CBC WITH DIFFERENTIAL/PLATELET
Basophils Absolute: 0 10*3/uL (ref 0.0–0.2)
Basos: 1 %
EOS (ABSOLUTE): 0.4 10*3/uL (ref 0.0–0.4)
Eos: 7 %
Hematocrit: 40.3 % (ref 34.0–46.6)
Hemoglobin: 13.1 g/dL (ref 11.1–15.9)
Immature Grans (Abs): 0 10*3/uL (ref 0.0–0.1)
Immature Granulocytes: 0 %
Lymphocytes Absolute: 1.6 10*3/uL (ref 0.7–3.1)
Lymphs: 29 %
MCH: 29.8 pg (ref 26.6–33.0)
MCHC: 32.5 g/dL (ref 31.5–35.7)
MCV: 92 fL (ref 79–97)
Monocytes Absolute: 0.6 10*3/uL (ref 0.1–0.9)
Monocytes: 10 %
Neutrophils Absolute: 2.9 10*3/uL (ref 1.4–7.0)
Neutrophils: 53 %
Platelets: 177 10*3/uL (ref 150–450)
RBC: 4.4 x10E6/uL (ref 3.77–5.28)
RDW: 12.2 % (ref 11.7–15.4)
WBC: 5.4 10*3/uL (ref 3.4–10.8)

## 2023-07-18 LAB — CMP14+EGFR
ALT: 19 IU/L (ref 0–32)
AST: 26 IU/L (ref 0–40)
Albumin: 4.1 g/dL (ref 3.8–4.8)
Alkaline Phosphatase: 100 IU/L (ref 44–121)
BUN/Creatinine Ratio: 21 (ref 12–28)
BUN: 12 mg/dL (ref 8–27)
Bilirubin Total: 0.6 mg/dL (ref 0.0–1.2)
CO2: 23 mmol/L (ref 20–29)
Calcium: 9.8 mg/dL (ref 8.7–10.3)
Chloride: 102 mmol/L (ref 96–106)
Creatinine, Ser: 0.57 mg/dL (ref 0.57–1.00)
Globulin, Total: 2.6 g/dL (ref 1.5–4.5)
Glucose: 94 mg/dL (ref 70–99)
Potassium: 4.6 mmol/L (ref 3.5–5.2)
Sodium: 142 mmol/L (ref 134–144)
Total Protein: 6.7 g/dL (ref 6.0–8.5)
eGFR: 94 mL/min/{1.73_m2} (ref >59)

## 2023-07-18 LAB — TSH: TSH: 0.006 u[IU]/mL — ABNORMAL LOW (ref 0.450–4.500)

## 2023-08-14 ENCOUNTER — Ambulatory Visit (INDEPENDENT_AMBULATORY_CARE_PROVIDER_SITE_OTHER): Payer: PPO

## 2023-08-14 DIAGNOSIS — Z23 Encounter for immunization: Secondary | ICD-10-CM

## 2023-09-23 DIAGNOSIS — Z1231 Encounter for screening mammogram for malignant neoplasm of breast: Secondary | ICD-10-CM | POA: Diagnosis not present

## 2023-10-08 ENCOUNTER — Encounter: Payer: Self-pay | Admitting: Family Medicine

## 2023-10-21 ENCOUNTER — Telehealth: Payer: Self-pay | Admitting: Family Medicine

## 2023-10-21 NOTE — Telephone Encounter (Signed)
Pt aware no apts today and to keep apt for tomorrow.  Copied from CRM [id number]. Topic: Appointments - Scheduling Inquiry for Clinic >> Oct 21, 2023  8:45 AM Dennison Nancy wrote: Reason for CRM: Patient has an appointment on 10/22/23 which is the first available for back pain in the middle and requesting if possible have any appointments today , please call patient if able to get her in today

## 2023-10-22 ENCOUNTER — Ambulatory Visit (INDEPENDENT_AMBULATORY_CARE_PROVIDER_SITE_OTHER): Payer: PPO

## 2023-10-22 ENCOUNTER — Encounter: Payer: Self-pay | Admitting: Nurse Practitioner

## 2023-10-22 ENCOUNTER — Ambulatory Visit (INDEPENDENT_AMBULATORY_CARE_PROVIDER_SITE_OTHER): Payer: PPO | Admitting: Nurse Practitioner

## 2023-10-22 VITALS — BP 118/57 | HR 82 | Temp 97.3°F | Ht 66.0 in | Wt 145.0 lb

## 2023-10-22 DIAGNOSIS — R109 Unspecified abdominal pain: Secondary | ICD-10-CM | POA: Diagnosis not present

## 2023-10-22 DIAGNOSIS — R0781 Pleurodynia: Secondary | ICD-10-CM | POA: Diagnosis not present

## 2023-10-22 LAB — URINALYSIS, COMPLETE
Bilirubin, UA: NEGATIVE
Glucose, UA: NEGATIVE
Ketones, UA: NEGATIVE
Leukocytes,UA: NEGATIVE
Nitrite, UA: NEGATIVE
Protein,UA: NEGATIVE
Specific Gravity, UA: 1.01 (ref 1.005–1.030)
Urobilinogen, Ur: 0.2 mg/dL (ref 0.2–1.0)
pH, UA: 6 (ref 5.0–7.5)

## 2023-10-22 LAB — MICROSCOPIC EXAMINATION
Bacteria, UA: NONE SEEN
Renal Epithel, UA: NONE SEEN /[HPF]
WBC, UA: NONE SEEN /[HPF] (ref 0–5)
Yeast, UA: NONE SEEN

## 2023-10-22 NOTE — Progress Notes (Signed)
Subjective:    Patient ID: [name], female    DOB: [birth year], 77 y.o.   MRN: [id number]   Chief Complaint: Back Pain (Middle back/Pulled trash can to road last week and has been hurting ever since)   Back Pain This is a new problem. The current episode started in the past 7 days. The problem occurs constantly. The problem has been waxing and waning since onset. The pain is present in the thoracic spine. The quality of the pain is described as aching. The pain does not radiate. The pain is at a severity of 8/10. The pain is moderate. The pain is The same all the time. Stiffness is present In the morning. Pertinent negatives include no abdominal pain, chest pain, dysuria, headaches or weakness. She has tried nothing for the symptoms. The treatment provided mild relief.    Patient Active Problem List   Diagnosis Date Noted   Neck pain, acute 05/30/2023   Early stage nonexudative age-related macular degeneration of both eyes 04/12/2022   Retinal vasculitis of left eye 04/06/2021   LBBB (left bundle branch block) 08/29/2020   Non-rheumatic mitral regurgitation 08/29/2020   Central retinal vein occlusion of right eye 03/21/2020   Non-rheumatic tricuspid valve insufficiency 03/06/2017   Personal history of pheochromocytoma 02/17/2014   Vitamin D deficiency 10/21/2013   Benign tumor of adrenal gland 10/21/2013   Hypothyroidism 10/21/2013   History of thyroid cancer 06/18/2013   Sinus tachycardia 07/06/2012   Atrial fibrillation (HCC) 04/04/2010   Mitral valve disorder 03/28/2010   Osteoporosis, post-menopausal 03/28/2010       Review of Systems  Constitutional:  Negative for diaphoresis.  Eyes:  Negative for pain.  Respiratory:  Negative for shortness of breath.   Cardiovascular:  Negative for chest pain, palpitations and leg swelling.  Gastrointestinal:  Negative for abdominal pain.  Endocrine: Negative for polydipsia.  Genitourinary:  Negative for dysuria.   Musculoskeletal:  Positive for back pain.  Skin:  Negative for rash.  Neurological:  Negative for dizziness, weakness and headaches.  Hematological:  Does not bruise/bleed easily.  All other systems reviewed and are negative.      Objective:   Physical Exam Constitutional:      Appearance: Normal appearance.  Cardiovascular:     Rate and Rhythm: Normal rate and regular rhythm.     Heart sounds: Normal heart sounds.  Pulmonary:     Effort: Pulmonary effort is normal.     Breath sounds: Normal breath sounds.  Skin:    General: Skin is warm.  Neurological:     General: No focal deficit present.     Mental Status: She is alert and oriented to person, place, and time.  Psychiatric:        Mood and Affect: Mood normal.        Behavior: Behavior normal.    BP (!) 118/57   Pulse 82   Temp (!) 97.3 F (36.3 C) (Temporal)   Ht 5\' 6"  (1.676 m)   Wt 145 lb (65.8 kg)   SpO2 97%   BMI 23.40 kg/m     KUB-Large amount of stool burden on right ascending colon Chest xray- abnormal calcified area right flank- radilogy report pending-Preliminary reading by Paulene Floor, FNP  [facility]      Assessment & Plan:   [name] comes in today with chief complaint of Back Pain (Middle back/Pulled trash can to road last week and has been hurting ever since)   Diagnosis and orders  addressed:  1. Right flank pain (Primary) Waiting on radiology report - Urinalysis, Complete - DG Abd 1 View - DG Ribs Unilateral W/Chest Right; Future   Labs pending Health Maintenance reviewed Diet and exercise encouraged  Follow up plan: Prn    Mary-Margaret Daphine Deutscher, FNP

## 2023-10-23 ENCOUNTER — Ambulatory Visit: Payer: PPO | Admitting: Cardiology

## 2023-11-01 ENCOUNTER — Ambulatory Visit (INDEPENDENT_AMBULATORY_CARE_PROVIDER_SITE_OTHER): Payer: PPO | Admitting: Family Medicine

## 2023-11-01 ENCOUNTER — Ambulatory Visit: Payer: PPO

## 2023-11-01 ENCOUNTER — Encounter: Payer: Self-pay | Admitting: Family Medicine

## 2023-11-01 VITALS — BP 109/68 | HR 88 | Temp 97.8°F | Ht 66.0 in | Wt 140.0 lb

## 2023-11-01 DIAGNOSIS — R0989 Other specified symptoms and signs involving the circulatory and respiratory systems: Secondary | ICD-10-CM | POA: Diagnosis not present

## 2023-11-01 DIAGNOSIS — R051 Acute cough: Secondary | ICD-10-CM

## 2023-11-01 MED ORDER — BENZONATATE 100 MG PO CAPS
100.0000 mg | ORAL_CAPSULE | Freq: Three times a day (TID) | ORAL | 0 refills | Status: DC | PRN
Start: 2023-11-01 — End: 2023-11-12

## 2023-11-01 MED ORDER — DOXYCYCLINE HYCLATE 100 MG PO TABS
100.0000 mg | ORAL_TABLET | Freq: Two times a day (BID) | ORAL | 0 refills | Status: AC
Start: 2023-11-01 — End: 2023-11-08

## 2023-11-01 NOTE — Progress Notes (Signed)
Subjective:  Patient ID: [name], female    DOB: 05/30/1946, 77 y.o.   MRN: [id number]  Patient Care Team: Dettinger, Elige Radon, MD as PCP - General (Family Medicine) Rollene Rotunda, MD (Cardiology) Luciana Axe Alford Highland, MD (Ophthalmology) Ernesto Rutherford, MD (Ophthalmology)   Chief Complaint:  Cough (X 1 week)  HPI: [name] is a 77 y.o. female presenting on 11/01/2023 for Cough (X 1 week)  States that symptoms started 8 days ago. Chest congestion, cough, not very productive. Somewhat foamy. States taht her chest and back are sore. States that it sounds rattly when she breathes.  Denies sore throat, eyes watery, ear pain, fever.    Relevant past medical, surgical, family, and social history reviewed and updated as indicated.  Allergies and medications reviewed and updated. Data reviewed: Chart in Epic.   Past Medical History:  Diagnosis Date   Allergy    Atrial fibrillation (HCC)    PT ON FLECANIDE -DID NOT WANT TO BE ON BLOOD THINNERS OTHER THAN DAILY ASA- due to adrenal gland issue - no issues since corrected    Blood transfusion without reported diagnosis    Blurry vision, right eye    HX OF RT EYE VISION DISTURBANCE 01/2012 -PT SEEN BY DR. RANKIN--ELEVATED B/P  THOUGHT TO BE RELATED TO [place] SPOTTED FEVER THOUGHT TO BE CAUSE OF VISION PROBLEM-HAD LASER OF BOTH EYES (SLIGHT DAMAGE TO LEFT  EYE).     Cataract    BILATERAL removed    GERD (gastroesophageal reflux disease)    Headache(784.0)    PAST HX MIGRAINES   Hearing loss of right ear    Heart murmur    TR    Hypertension    B/P HAS BEEN UP AND DOWN BUT MOSTLY DOWN WHILE ON METOPROLOL -WAS TAKEN OFF METOROLOL IN NOV 2012 BY CARDIOLOGIST AND THEN IN MARCH 2013 PT'S B/P BECAME REALLY ELEVATED-SHE RESTARTED METOPROLOL  AND WAS GIVEN BENICAR.  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 hx of mets to luns, which was treated (unsure of how)   TMJ locking    PT STATES IF MOUTH OPEN EXTENDED TIME-LIKE AT DENTIST--HER JAWS LOCK   TR (tricuspid regurgitation)    Vegetarian diet    DOES EAT EGGS AND DAIRY--NO MEATS OR FISH    Past Surgical History:  Procedure Laterality Date   ADRENALECTOMY  07/03/2012   Procedure: ADRENALECTOMY;  Surgeon: Crecencio Mc, MD;  Location: [facility];  Service: Urology;  Laterality: Right;   APPENDECTOMY     Lung biopsies     THYROIDECTOMY     UPPER GASTROINTESTINAL ENDOSCOPY     WRIST SURGERY      Social History   Socioeconomic History   Marital status: Married    Spouse name: Konrad Dolores   Number of children: 1   Years of education: Not on file  Highest education level: 12th grade  Occupational History   Occupation: retired  Tobacco Use   Smoking status: Never   Smokeless tobacco: Never   Tobacco comments:    no tobacco   Vaping Use   Vaping status: Never Used  Substance and Sexual Activity   Alcohol use: No   Drug use: No   Sexual activity: Not on file  Other Topics Concern   Not on file  Social History Narrative   Lives in [place] with husband. Used to work full time as a Conservator, museum/gallery, now works one day/week. No herbal meds. Vegetarian; no caffeine Does not exercise regularly but is very active and denies any exertional symptoms.    Daughter lives close by   Social Drivers of Health   Financial Resource Strain: Low Risk  (10/21/2023)   Overall Financial Resource Strain (CARDIA)    Difficulty of Paying Living Expenses: Not hard at  all  Food Insecurity: No Food Insecurity (10/21/2023)   Hunger Vital Sign    Worried About Running Out of Food in the Last Year: Never true    Ran Out of Food in the Last Year: Never true  Transportation Needs: No Transportation Needs (10/21/2023)   PRAPARE - Administrator, Civil Service (Medical): No    Lack of Transportation (Non-Medical): No  Physical Activity: Insufficiently Active (10/21/2023)   Exercise Vital Sign    Days of Exercise per Week: 5 days    Minutes of Exercise per Session: 20 min  Stress: No Stress Concern Present (10/21/2023)   [employer] of Occupational Health - Occupational Stress Questionnaire    Feeling of Stress : Not at all  Social Connections: Socially Integrated (10/21/2023)   Social Connection and Isolation Panel [NHANES]    Frequency of Communication with Friends and Family: More than three times a week    Frequency of Social Gatherings with Friends and Family: Three times a week    Attends Religious Services: More than 4 times per year    Active Member of Clubs or Organizations: Yes    Attends Banker Meetings: More than 4 times per year    Marital Status: Married  Catering manager Violence: Not At Risk (02/21/2023)   Humiliation, Afraid, Rape, and Kick questionnaire    Fear of Current or Ex-Partner: No    Emotionally Abused: No    Physically Abused: No    Sexually Abused: No    Outpatient Encounter Medications as of 11/01/2023  Medication Sig   Artificial Tear Ointment (REFRESH LACRI-LUBE) OINT Apply 1 inch to eye at bedtime. Apply to bottom lid   aspirin 325 MG EC tablet Take 325 mg by mouth daily.   carboxymethylcellul-glycerin (REFRESH OPTIVE) 0.5-0.9 % ophthalmic solution Place 1 drop into both eyes in the morning and at bedtime.   Cholecalciferol (VITAMIN D3) 2000 UNITS TABS Take 1 capsule by mouth daily.   flecainide (TAMBOCOR) 50 MG tablet Take 1 tablet (50 mg total) by mouth 2 (two) times daily.    levothyroxine (SYNTHROID) 137 MCG tablet TAKE 1 TABLET BY MOUTH DAILY BEFORE BREAKFAST.   loratadine (CLARITIN) 10 MG tablet Take 10 mg by mouth daily as needed for allergies.    Multiple Vitamins-Minerals (CENTRUM SILVER) tablet Take 1 tablet by mouth every morning.   Multiple Vitamins-Minerals (PRESERVISION/LUTEIN PO) Take 1 capsule by mouth daily.   Omega-3 Fatty Acids (FISH OIL) 1200 MG CAPS Take 1 capsule by mouth daily.   No facility-administered encounter medications on file as  of 11/01/2023.    Allergies  Allergen Reactions   Penicillins Anaphylaxis   Actonel [Risedronate Sodium]     Nausea and worsened heart burn   Ceclor [Cefaclor] Hives   Acetaminophen Nausea And Vomiting and Other (See Comments)    Headache   Alendronate Sodium Nausea Only and Other (See Comments)    Heartburn, stomach upset   Celecoxib Other (See Comments)    Stomach upset, heart burn   Codeine Nausea And Vomiting and Other (See Comments)    Headache   Ventolin [Kdc:Albuterol] Palpitations    Rapid heart beat    Review of Systems As per HPI  Objective:  BP 109/68   Pulse 88   Temp 97.8 F (36.6 C)   Ht 5\' 6"  (1.676 m)   Wt 140 lb (63.5 kg)   SpO2 97%   BMI 22.60 kg/m    Wt Readings from Last 3 Encounters:  11/01/23 140 lb (63.5 kg)  10/22/23 145 lb (65.8 kg)  07/17/23 144 lb (65.3 kg)    Physical Exam Constitutional:      General: She is awake. She is not in acute distress.    Appearance: Normal appearance. She is well-developed and well-groomed. She is not ill-appearing, toxic-appearing or diaphoretic.  HENT:     Right Ear: A middle ear effusion is present.     Left Ear: A middle ear effusion is present.     Nose: Congestion present.     Right Sinus: No maxillary sinus tenderness or frontal sinus tenderness.     Left Sinus: No maxillary sinus tenderness or frontal sinus tenderness.     Mouth/Throat:     Lips: Pink. No lesions.     Pharynx: Posterior oropharyngeal erythema and  postnasal drip present. No pharyngeal swelling or oropharyngeal exudate.     Tonsils: No tonsillar exudate or tonsillar abscesses.  Cardiovascular:     Rate and Rhythm: Normal rate and regular rhythm.     Pulses: Normal pulses.          Radial pulses are 2+ on the right side and 2+ on the left side.       Posterior tibial pulses are 2+ on the right side and 2+ on the left side.     Heart sounds: Normal heart sounds. No murmur heard.    No gallop.  Pulmonary:     Effort: Pulmonary effort is normal. No respiratory distress.     Breath sounds: Decreased air movement present. No stridor. Examination of the right-lower field reveals decreased breath sounds and rhonchi. Examination of the left-lower field reveals decreased breath sounds and rhonchi. Decreased breath sounds and rhonchi present. No rales.  Musculoskeletal:     Cervical back: Full passive range of motion without pain and neck supple.     Right lower leg: No edema.     Left lower leg: No edema.  Skin:    General: Skin is warm.     Capillary Refill: Capillary refill takes less than 2 seconds.     Coloration: Skin is pale.  Neurological:     General: No focal deficit present.     Mental Status: She is alert, oriented to person, place, and time and easily aroused. Mental status is at baseline.     GCS: GCS eye subscore is 4. GCS verbal subscore is 5. GCS motor subscore is 6.     Motor: No weakness.  Psychiatric:        Attention and Perception: Attention and perception normal.  Mood and Affect: Mood and affect normal.        Speech: Speech normal.        Behavior: Behavior normal. Behavior is cooperative.        Thought Content: Thought content normal. Thought content does not include homicidal or suicidal ideation. Thought content does not include homicidal or suicidal plan.        Cognition and Memory: Cognition and memory normal.        Judgment: Judgment normal.     Results for orders placed or performed in visit on  10/22/23  Microscopic Examination   Collection Time: 10/22/23 10:53 AM   Urine  Result Value Ref Range   WBC, UA None seen 0 - 5 /hpf   RBC, Urine 0-2 0 - 2 /hpf   Epithelial Cells (non renal) 0-10 0 - 10 /hpf   Renal Epithel, UA None seen None seen /hpf   Bacteria, UA None seen None seen/Few   Yeast, UA None seen None seen  Urinalysis, Complete   Collection Time: 10/22/23 10:53 AM  Result Value Ref Range   Specific Gravity, UA 1.010 1.005 - 1.030   pH, UA 6.0 5.0 - 7.5   Color, UA Yellow Yellow   Appearance Ur Clear Clear   Leukocytes,UA Negative Negative   Protein,UA Negative Negative/Trace   Glucose, UA Negative Negative   Ketones, UA Negative Negative   RBC, UA Trace (A) Negative   Bilirubin, UA Negative Negative   Urobilinogen, Ur 0.2 0.2 - 1.0 mg/dL   Nitrite, UA Negative Negative   Microscopic Examination See below:        11/01/2023    3:48 PM 10/22/2023   10:40 AM 07/17/2023    8:26 AM 05/30/2023    8:31 AM 02/21/2023    9:49 AM  Depression screen PHQ 2/9  Decreased Interest 0 0 0 0 0  Down, Depressed, Hopeless  0 0 0 0  PHQ - 2 Score 0 0 0 0 0  Altered sleeping  0 0 0 0  Tired, decreased energy  0 0 0 0  Change in appetite  0 0 0 0  Feeling bad or failure about yourself   0 0 0 0  Trouble concentrating  0 0 0 0  Moving slowly or fidgety/restless  0 0 0 0  Suicidal thoughts  0 0 0 0  PHQ-9 Score  0 0 0 0  Difficult doing work/chores  Not difficult at all Not difficult at all Not difficult at all Not difficult at all       10/22/2023   10:40 AM 07/17/2023    8:26 AM 05/30/2023    8:31 AM 01/14/2023    8:55 AM  GAD 7 : Generalized Anxiety Score  Nervous, Anxious, on Edge 0 0 0 0  Control/stop worrying 0 0 0 0  Worry too much - different things 0 0 0 0  Trouble relaxing 0 0 0 0  Restless 0 0 0 0  Easily annoyed or irritable 0 0 0 0  Afraid - awful might happen 0 0 0 0  Total GAD 7 Score 0 0 0 0  Anxiety Difficulty Not difficult at all Not difficult at  all Not difficult at all Not difficult at all    Pertinent labs & imaging results that were available during my care of the patient were reviewed by me and considered in my medical decision making.  Assessment & Plan:  Margueritte Utterback" was seen today for cough.  Diagnoses and all orders for this visit:  Acute cough Will start medications as below. Discussed continuing at home therapy. Encouraged patient to monitor for worsening symptoms.  -     benzonatate (TESSALON PERLES) 100 MG capsule; Take 1 capsule (100 mg total) by mouth 3 (three) times daily as needed. -     doxycycline (VIBRA-TABS) 100 MG tablet; Take 1 tablet (100 mg total) by mouth 2 (two) times daily for 7 days.  Chest congestion As above  -     benzonatate (TESSALON PERLES) 100 MG capsule; Take 1 capsule (100 mg total) by mouth 3 (three) times daily as needed. -     doxycycline (VIBRA-TABS) 100 MG tablet; Take 1 tablet (100 mg total) by mouth 2 (two) times daily for 7 days.  Continue all other maintenance medications.  Follow up plan: Return if symptoms worsen or fail to improve.   Continue healthy lifestyle choices, including diet (rich in fruits, vegetables, and lean proteins, and low in salt and simple carbohydrates) and exercise (at least 30 minutes of moderate physical activity daily).  Written and verbal instructions provided   The above assessment and management plan was discussed with the patient. The patient verbalized understanding of and has agreed to the management plan. Patient is aware to call the clinic if they develop any new symptoms or if symptoms persist or worsen. Patient is aware when to return to the clinic for a follow-up visit. Patient educated on when it is appropriate to go to the emergency department.   Neale Burly, DNP-FNP [facility] [address] [place], [place] [address] [phone]

## 2023-11-09 NOTE — Progress Notes (Signed)
  Cardiology Office Note:   Date:  11/12/2023  ID:  [name], DOB 10-15-1946, MRN [id number] PCP: Dettinger, Fonda LABOR, MD  [facility] Providers Cardiologist:  None {  History of Present Illness:   [name] is a 78 y.o. female who presents for follow up of atrial fib.   She has had surgery for treatment of  pheochromocytoma.  She has moderate tricuspid regurgitation with no evidence of pulmonary hypertension.   The last echo was 2023.    Unfortunately since I saw her she fell while going to her grandsons wedding.  She injured her back.  She has been limited in her activities because of this.  She then had an upper respiratory infection that required antibiotics and she had a lot of coughing which exacerbated the back pain.  The patient denies any new symptoms such as chest discomfort, neck or arm discomfort. There has been no new shortness of breath, PND or orthopnea. There have been no reported palpitations, presyncope or syncope.   ROS:   As stated in the HPI and negative for all other systems.  Studies Reviewed:    EKG:   EKG Interpretation Date/Time:  Tuesday November 12 2023 11:48:04 EST Ventricular Rate:  78 PR Interval:  164 QRS Duration:  142 QT Interval:  400 QTC Calculation: 456 R Axis:   78  Text Interpretation: Normal sinus rhythm Left bundle branch block When compared with ECG of 05-Jul-2012 05:44, Left bundle branch block is now Present Confirmed by Lavona Agent ([id number]) on 11/12/2023 12:02:20 PM     Risk Assessment/Calculations:    CHA2DS2-VASc Score = 4   This indicates a 4.8% annual risk of stroke. The patient's score is based upon: CHF History: 0 HTN History: 1 Diabetes History: 0 Stroke History: 0 Vascular Disease History: 0 Age Score: 2 Gender Score: 1       Physical Exam:   VS:  BP (!) 106/58   Pulse 78    Wt Readings from Last 3 Encounters:  11/01/23 140 lb (63.5 kg)  10/22/23 145 lb (65.8 kg)  07/17/23 144 lb (65.3 kg)      GEN: Well nourished, well developed in no acute distress NECK: No JVD; No carotid bruits CARDIAC: RRR, 3 out of 6 holosystolic murmur heard at the apex, no diastolic murmurs, rubs, gallops RESPIRATORY:  Clear to auscultation without rales, wheezing or rhonchi  ABDOMEN: Soft, non-tender, non-distended EXTREMITIES:  No edema; No deformity   ASSESSMENT AND PLAN:   HYPERTENSION -  The blood pressure is at target.  No change in therapy.   ATRIAL FIBRILLATION -  She has had no symptomatic paroxysms of this.  She has had no symptomatic paroxysms of this.  No change in therapy.   TRICUSPID REGURGITATION - This was moderate in 2023.  I will check an echocardiogram in November and follow-up on this and the mitral regurgitation.   MITRAL REGURGITATION -  This was moderate on echo in 2023.  This will be followed as above.    LBBB - This is chronic.  No change in therapy     Follow up with me in 1 year  Signed, Agent Lavona, MD

## 2023-11-12 ENCOUNTER — Ambulatory Visit: Payer: PPO | Admitting: Cardiology

## 2023-11-12 ENCOUNTER — Encounter: Payer: Self-pay | Admitting: Cardiology

## 2023-11-12 VITALS — BP 106/58 | HR 78

## 2023-11-12 DIAGNOSIS — I447 Left bundle-branch block, unspecified: Secondary | ICD-10-CM

## 2023-11-12 DIAGNOSIS — I340 Nonrheumatic mitral (valve) insufficiency: Secondary | ICD-10-CM

## 2023-11-12 DIAGNOSIS — I361 Nonrheumatic tricuspid (valve) insufficiency: Secondary | ICD-10-CM

## 2023-11-12 DIAGNOSIS — I480 Paroxysmal atrial fibrillation: Secondary | ICD-10-CM

## 2023-11-12 NOTE — Patient Instructions (Signed)
 Medication Instructions:  The current medical regimen is effective;  continue present plan and medications.  *If you need a refill on your cardiac medications before your next appointment, please call your pharmacy*  Testing/Procedures: Your physician has requested that you have an echocardiogram in November 2025. Echocardiography is a painless test that uses sound waves to create images of your heart. It provides your doctor with information about the size and shape of your heart and how well your heart's chambers and valves are working. This procedure takes approximately one hour. There are no restrictions for this procedure. Please do NOT wear cologne, perfume, aftershave, or lotions (deodorant is allowed). Please arrive 15 minutes prior to your appointment time.  Please note: We ask at that you not bring children with you during ultrasound (echo/ vascular) testing. Due to room size and safety concerns, children are not allowed in the ultrasound rooms during exams. Our front office staff cannot provide observation of children in our lobby area while testing is being conducted. An adult accompanying a patient to their appointment will only be allowed in the ultrasound room at the discretion of the ultrasound technician under special circumstances. We apologize for any inconvenience.  Follow-Up: At [facility], you and your health needs are our priority.  As part of our continuing mission to provide you with exceptional heart care, we have created designated Provider Care Teams.  These Care Teams include your primary Cardiologist (physician) and Advanced Practice Providers (APPs -  Physician Assistants and Nurse Practitioners) who all work together to provide you with the care you need, when you need it.  We recommend signing up for the patient portal called MyChart.  Sign up information is provided on this After Visit Summary.  MyChart is used to connect with patients for Virtual Visits  (Telemedicine).  Patients are able to view lab/test results, encounter notes, upcoming appointments, etc.  Non-urgent messages can be sent to your provider as well.   To learn more about what you can do with MyChart, go to [email or web address].    Your next appointment:   1 year(s)  Provider:   Lynwood Schilling, MD

## 2023-11-14 ENCOUNTER — Ambulatory Visit: Payer: Self-pay | Admitting: Family Medicine

## 2023-11-14 NOTE — Telephone Encounter (Signed)
 Copied from CRM [id number]. Topic: Clinical - Red Word Triage >> Nov 14, 2023  2:38 PM Laurier BROCKS wrote: Red Word that prompted transfer to Nurse Triage: Patient has been having really bad back pain. She says it's hard to sleep at night and sometimes the pain travels to her sides.  Chief Complaint: back pain Symptoms: pain, difficulty sleeping, pain travels to the side. Frequency: constant Pertinent Negatives: Patient denies fever, inability to walk, abd pain Disposition: [] ED /[] Urgent Care (no appt availability in office) / [x] Appointment(In office/virtual)/ []  [facility]/ [] Home Care/ [] Refused Recommended Disposition /[] [facility]/ []  Follow-up with PCP Additional Notes: [place] at [employer] wedding in Sept. '24.  States has a cough now and it is irratiating her back.  Apt made for tomorrow; care advice given; denies questions; instructed to go to er if becomes worse.  Reason for Disposition  [1] Pain radiates into the thigh or further down the leg AND [2] one leg  Answer Assessment - Initial Assessment Questions 1. ONSET: When did the pain begin?      When I fell in late September 2. LOCATION: Where does it hurt? (upper, mid or lower back)     Entire back 3. SEVERITY: How bad is the pain?  (e.g., Scale 1-10; mild, moderate, or severe)   - MILD (1-3): Doesn't interfere with normal activities.    - MODERATE (4-7): Interferes with normal activities or awakens from sleep.    - SEVERE (8-10): Excruciating pain, unable to do any normal activities.      10/10 4. PATTERN: Is the pain constant? (e.g., yes, no; constant, intermittent)      Always there 5. RADIATION: Does the pain shoot into your legs or somewhere else?     Both sides 6. CAUSE:  What do you think is causing the back pain?      Old injury 8. MEDICINES: What have you taken so far for the pain? (e.g., nothing, acetaminophen , NSAIDS)     tylenol  9. NEUROLOGIC SYMPTOMS: Do you have any weakness,  numbness, or problems with bowel/bladder control?     denies 10. OTHER SYMPTOMS: Do you have any other symptoms? (e.g., fever, abdomen pain, burning with urination, blood in urine)       Denies.  Protocols used: Back Pain-A-AH

## 2023-11-15 ENCOUNTER — Encounter: Payer: Self-pay | Admitting: Family Medicine

## 2023-11-15 ENCOUNTER — Ambulatory Visit (INDEPENDENT_AMBULATORY_CARE_PROVIDER_SITE_OTHER): Payer: PPO | Admitting: Family Medicine

## 2023-11-15 VITALS — BP 111/59 | HR 78 | Temp 98.0°F | Ht 66.0 in | Wt 140.0 lb

## 2023-11-15 DIAGNOSIS — G8929 Other chronic pain: Secondary | ICD-10-CM

## 2023-11-15 DIAGNOSIS — M546 Pain in thoracic spine: Secondary | ICD-10-CM | POA: Diagnosis not present

## 2023-11-15 NOTE — Progress Notes (Signed)
   Acute Office Visit  Subjective:     Patient ID: [name], female    DOB: Oct 07, 1946, 78 y.o.   MRN: [id number]  Chief Complaint  Patient presents with   Back Pain    Back Pain This is a new problem. The current episode started more than 1 month ago. The problem occurs intermittently. The problem has been waxing and waning since onset. The pain is present in the thoracic spine. The quality of the pain is described as aching and stabbing. The pain does not radiate. The pain is moderate. The symptoms are aggravated by bending and twisting. Pertinent negatives include no abdominal pain, bladder incontinence, bowel incontinence, dysuria, fever, headaches, leg pain, numbness, paresthesias, pelvic pain, perianal numbness, tingling or weakness. She has tried bed rest, heat, NSAIDs and analgesics for the symptoms. The treatment provided mild relief.   She is interested in a referral to PT.  Review of Systems  Constitutional:  Negative for fever.  Gastrointestinal:  Negative for abdominal pain and bowel incontinence.  Genitourinary:  Negative for bladder incontinence, dysuria and pelvic pain.  Musculoskeletal:  Positive for back pain.  Neurological:  Negative for tingling, weakness, numbness, headaches and paresthesias.        Objective:    BP (!) 111/59   Pulse 78   Temp 98 F (36.7 C) (Temporal)   Ht 5' 6 (1.676 m)   Wt 140 lb (63.5 kg)   SpO2 97%   BMI 22.60 kg/m    Physical Exam Vitals and nursing note reviewed.  Constitutional:      General: She is not in acute distress.    Appearance: She is not ill-appearing, toxic-appearing or diaphoretic.  Musculoskeletal:     Thoracic back: Tenderness (paraspinal bilateral) present. No swelling, edema, lacerations or bony tenderness. Normal range of motion.  Skin:    General: Skin is warm and dry.  Neurological:     General: No focal deficit present.     Mental Status: She is alert and oriented to person, place, and time.   Psychiatric:        Mood and Affect: Mood normal.        Behavior: Behavior normal.     No results found for any visits on 11/15/23.      Assessment & Plan:   [name] was seen today for back pain.  Diagnoses and all orders for this visit:  Chronic bilateral thoracic back pain Not well controlled. No red flag symptoms. Referral discussed and placed as below. Continue heat, tylenol  prn. Return to office for new or worsening symptoms, or if symptoms persist.  -     Ambulatory referral to Physical Therapy  Annabella CHRISTELLA Search, FNP

## 2023-11-25 ENCOUNTER — Ambulatory Visit: Payer: PPO | Attending: Family Medicine

## 2023-11-25 ENCOUNTER — Other Ambulatory Visit: Payer: Self-pay

## 2023-11-25 DIAGNOSIS — M546 Pain in thoracic spine: Secondary | ICD-10-CM | POA: Insufficient documentation

## 2023-11-25 DIAGNOSIS — G8929 Other chronic pain: Secondary | ICD-10-CM | POA: Insufficient documentation

## 2023-11-25 DIAGNOSIS — M6281 Muscle weakness (generalized): Secondary | ICD-10-CM | POA: Insufficient documentation

## 2023-11-25 NOTE — Therapy (Signed)
OUTPATIENT PHYSICAL THERAPY THORACOLUMBAR EVALUATION   Patient Name: [name] MRN: [id number] DOB:21-Mar-1946, 78 y.o., female Today's Date: 11/25/2023  END OF SESSION:  PT End of Session - 11/25/23 1005     Visit Number 1    Number of Visits 8    Date for PT Re-Evaluation 01/31/24    PT Start Time 1016    PT Stop Time 1051    PT Time Calculation (min) 35 min    Activity Tolerance Patient tolerated treatment well    Behavior During Therapy WFL for tasks assessed/performed             Past Medical History:  Diagnosis Date   Allergy    Atrial fibrillation (HCC)    PT ON FLECANIDE -DID NOT WANT TO BE ON BLOOD THINNERS OTHER THAN DAILY ASA- due to adrenal gland issue - no issues since corrected    Blood transfusion without reported diagnosis    Blurry vision, right eye    HX OF RT EYE VISION DISTURBANCE 01/2012 -PT SEEN BY DR. RANKIN--ELEVATED B/P  THOUGHT TO BE RELATED TO [place] SPOTTED FEVER THOUGHT TO BE CAUSE OF VISION PROBLEM-HAD LASER OF BOTH EYES (SLIGHT DAMAGE TO LEFT  EYE).     Cataract    BILATERAL removed    GERD (gastroesophageal reflux disease)    Headache(784.0)    PAST HX MIGRAINES   Hearing loss of right ear    Heart murmur    TR    Hypertension    B/P HAS BEEN UP AND DOWN BUT MOSTLY DOWN WHILE ON METOPROLOL -WAS TAKEN OFF METOROLOL IN NOV 2012 BY CARDIOLOGIST AND THEN IN MARCH 2013 PT'S B/P BECAME REALLY ELEVATED-SHE RESTARTED METOPROLOL  AND WAS GIVEN BENICAR.  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 hx of mets to luns, which was treated (unsure of how)   TMJ locking    PT STATES IF MOUTH OPEN EXTENDED TIME-LIKE AT DENTIST--HER JAWS LOCK   TR (tricuspid regurgitation)    Vegetarian diet    DOES EAT EGGS AND DAIRY--NO MEATS OR FISH   Past Surgical History:  Procedure Laterality Date   ADRENALECTOMY  07/03/2012   Procedure: ADRENALECTOMY;  Surgeon: Crecencio Mc, MD;  Location: [facility];  Service: Urology;  Laterality: Right;   APPENDECTOMY     Lung biopsies     THYROIDECTOMY     UPPER GASTROINTESTINAL ENDOSCOPY     WRIST SURGERY     Patient Active Problem List   Diagnosis Date Noted   Neck pain, acute 05/30/2023   Early stage nonexudative age-related macular degeneration of both eyes 04/12/2022   Retinal vasculitis of left eye 04/06/2021   LBBB (left bundle branch block) 08/29/2020   Non-rheumatic mitral regurgitation 08/29/2020   Central retinal vein occlusion of right eye 03/21/2020   Non-rheumatic tricuspid valve insufficiency 03/06/2017   Personal history of pheochromocytoma 02/17/2014  Vitamin D deficiency 10/21/2013   Benign tumor of adrenal gland 10/21/2013   Hypothyroidism 10/21/2013   History of thyroid cancer 06/18/2013   Sinus tachycardia 07/06/2012   Atrial fibrillation (HCC) 04/04/2010   Mitral valve disorder 03/28/2010   Osteoporosis, post-menopausal 03/28/2010   REFERRING PROVIDER: Gabriel Earing, FNP   REFERRING DIAG: Chronic bilateral thoracic back pain   Rationale for Evaluation and Treatment: Rehabilitation  THERAPY DIAG:  Pain in thoracic spine  Muscle weakness (generalized)  ONSET DATE: 07/30/23  SUBJECTIVE:                                                                                                                                                                                           SUBJECTIVE STATEMENT: Patient reports that she began having neck and shoulder  pain last June. However, this has been getting better. However, her thoracic spine started hurting after she fell while walking down her steps after she accidentally stepped on her dress. She also had pneumonia in December which caused her to hurt more due to coughing.   PERTINENT HISTORY:  Atrial fibrillation, osteoporosis, allergies, hypertension, osteoarthritis, and history of thyroid cancer  PAIN:  Are you having pain? Yes: NPRS scale: Current: 2/10 Best: 2/10 Worst: 8/10 Pain location: Thoracic spine (R>L) Pain description: sore Aggravating factors: bending over, vacuuming, sweeping, pulling Relieving factors: sitting and medication  PRECAUTIONS: Fall  RED FLAGS: None   WEIGHT BEARING RESTRICTIONS: No  FALLS:  Has patient fallen in last 6 months? Yes. Number of falls 1  LIVING ENVIRONMENT: Lives with: lives with their spouse Lives in: House/apartment Has following equipment at home: None  OCCUPATION: retired  PLOF: Independent  PATIENT GOALS: reduced pain, be able to do most activities around the house, improved sleep (prefers to lay supine, but cannot due to pain), and improved mobility  NEXT MD VISIT: 01/15/24  OBJECTIVE:  Note: Objective measures were completed at Evaluation unless otherwise noted.  DIAGNOSTIC FINDINGS: 10/22/23 rib x-ray IMPRESSION: Negative.  PATIENT SURVEYS:  FOTO 53.57  COGNITION: Overall cognitive status: Within functional limits for tasks assessed     SENSATION: Patient reports no numbness or tingling  POSTURE: rounded shoulders, forward head, decreased lumbar lordosis, and increased thoracic kyphosis  PALPATION: TTP: right thoracic paraspinals and T11 spinous process  LUMBAR ROM:   AROM eval  Flexion 70  Extension 22  Right lateral flexion 25% limited  Left lateral flexion 50% limited; familiar pain  Right rotation 25% limited  Left rotation No limitation   (Blank rows = not tested)  LOWER EXTREMITY ROM: WFL for  activities assessed   LOWER EXTREMITY MMT:  MMT Right eval Left eval  Hip flexion 4-/5 3/5  Hip extension    Hip abduction    Hip adduction    Hip internal rotation    Hip external rotation    Knee flexion 4+/5 4+/5  Knee extension 4+/5 5/5  Ankle dorsiflexion 4-/5 4-/5  Ankle plantarflexion    Ankle inversion    Ankle eversion     (Blank rows = not tested)  GAIT: Assistive device utilized: None Level of assistance: Complete Independence Comments: no significant gait deviations observed  TREATMENT DATE:                                                                                                                                  PATIENT EDUCATION:  Education details: plan of care, prognosis, healing, anatomy, and goals for physical therapy Person educated: Patient Education method: Explanation Education comprehension: verbalized understanding  HOME EXERCISE PROGRAM:   ASSESSMENT:  CLINICAL IMPRESSION: Patient is a 78 y.o. female who was seen today for physical therapy evaluation and treatment for thoracic spine pain secondary to a fall.  She presented with low pain severity and irritability with left lumbar sidebending being the most aggravating to her familiar symptoms.  Recommend that she continue with skilled physical therapy to address her impairments to return to her prior level of function.  OBJECTIVE IMPAIRMENTS: decreased activity tolerance, decreased ROM, decreased strength, hypomobility, impaired tone, postural dysfunction, and pain.   ACTIVITY LIMITATIONS: carrying, lifting, and sleeping  PARTICIPATION LIMITATIONS: cleaning and shopping  PERSONAL FACTORS: Past/current experiences, Time since onset of injury/illness/exacerbation, and 3+ comorbidities: Atrial fibrillation, osteoporosis, allergies, hypertension, osteoarthritis, and history of thyroid cancer  are also affecting patient's functional outcome.   REHAB POTENTIAL: Good  CLINICAL DECISION  MAKING: Evolving/moderate complexity  EVALUATION COMPLEXITY: Moderate   GOALS: Goals reviewed with patient? Yes  LONG TERM GOALS: Target date: 12/23/23  Patient will be independent with her HEP. Baseline:  Goal status: INITIAL  2.  Patient will be able to complete her daily activities without her familiar symptoms exceeding 6/10. Baseline:  Goal status: INITIAL  3.  Patient will report being able to sit for at least 45 minutes without being limited by her familiar symptoms to return to attending her regular church services. Baseline:  Goal status: INITIAL  4.  Patient will report being able to sleep without being awakened by her familiar symptoms. Baseline:  Goal status: INITIAL  5.  Patient will improve her left hip flexor strength to at least 4-/5 for improved functional mobility. Baseline:  Goal status: INITIAL  PLAN:  PT FREQUENCY: 2x/week  PT DURATION: 4 weeks  PLANNED INTERVENTIONS: 97164- PT Re-evaluation, 97110-Therapeutic exercises, 97530- Therapeutic activity, 97112- Neuromuscular re-education, 97535- Self Care, 54098- Manual therapy, 97014- Electrical stimulation (unattended), 97035- Ultrasound, Patient/Family education, Balance training, Dry Needling, Joint mobilization, Spinal mobilization, Cryotherapy, and Moist heat.  PLAN FOR NEXT SESSION: NuStep, rows, postural reeducation, manual therapy, and modalities as  needed   Granville Lewis, PT 11/25/2023, 1:13 PM

## 2023-11-29 ENCOUNTER — Ambulatory Visit: Payer: PPO | Admitting: *Deleted

## 2023-11-29 ENCOUNTER — Encounter: Payer: Self-pay | Admitting: *Deleted

## 2023-11-29 DIAGNOSIS — M6281 Muscle weakness (generalized): Secondary | ICD-10-CM

## 2023-11-29 DIAGNOSIS — M546 Pain in thoracic spine: Secondary | ICD-10-CM

## 2023-11-29 NOTE — Therapy (Signed)
OUTPATIENT PHYSICAL THERAPY THORACOLUMBAR EVALUATION   Patient Name: [name] MRN: [id number] DOB:Aug 25, 1946, 78 y.o., female Today's Date: 11/29/2023  END OF SESSION:  PT End of Session - 11/29/23 1023     Visit Number 2    Number of Visits 8    Date for PT Re-Evaluation 01/31/24    PT Start Time 1015    PT Stop Time 1105    PT Time Calculation (min) 50 min             Past Medical History:  Diagnosis Date   Allergy    Atrial fibrillation (HCC)    PT ON FLECANIDE -DID NOT WANT TO BE ON BLOOD THINNERS OTHER THAN DAILY ASA- due to adrenal gland issue - no issues since corrected    Blood transfusion without reported diagnosis    Blurry vision, right eye    HX OF RT EYE VISION DISTURBANCE 01/2012 -PT SEEN BY DR. RANKIN--ELEVATED B/P  THOUGHT TO BE RELATED TO [place] SPOTTED FEVER THOUGHT TO BE CAUSE OF VISION PROBLEM-HAD LASER OF BOTH EYES (SLIGHT DAMAGE TO LEFT  EYE).     Cataract    BILATERAL removed    GERD (gastroesophageal reflux disease)    Headache(784.0)    PAST HX MIGRAINES   Hearing loss of right ear    Heart murmur    TR    Hypertension    B/P HAS BEEN UP AND DOWN BUT MOSTLY DOWN WHILE ON METOPROLOL -WAS TAKEN OFF METOROLOL IN NOV 2012 BY CARDIOLOGIST AND THEN IN MARCH 2013 PT'S B/P BECAME REALLY ELEVATED-SHE RESTARTED METOPROLOL  AND WAS GIVEN BENICAR.  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 hx of mets to luns, which was treated  (unsure of how)   TMJ locking    PT STATES IF MOUTH OPEN EXTENDED TIME-LIKE AT DENTIST--HER JAWS LOCK   TR (tricuspid regurgitation)    Vegetarian diet    DOES EAT EGGS AND DAIRY--NO MEATS OR FISH   Past Surgical History:  Procedure Laterality Date   ADRENALECTOMY  07/03/2012   Procedure: ADRENALECTOMY;  Surgeon: Crecencio Mc, MD;  Location: [facility];  Service: Urology;  Laterality: Right;   APPENDECTOMY     Lung biopsies     THYROIDECTOMY     UPPER GASTROINTESTINAL ENDOSCOPY     WRIST SURGERY     Patient Active Problem List   Diagnosis Date Noted   Neck pain, acute 05/30/2023   Early stage nonexudative age-related macular degeneration of both eyes 04/12/2022   Retinal vasculitis of left eye 04/06/2021   LBBB (left bundle branch block) 08/29/2020   Non-rheumatic mitral regurgitation 08/29/2020   Central retinal vein occlusion of right eye 03/21/2020   Non-rheumatic tricuspid valve insufficiency 03/06/2017   Personal history of pheochromocytoma 02/17/2014   Vitamin D deficiency 10/21/2013   Benign tumor of adrenal gland 10/21/2013   Hypothyroidism 10/21/2013  History of thyroid cancer 06/18/2013   Sinus tachycardia 07/06/2012   Atrial fibrillation (HCC) 04/04/2010   Mitral valve disorder 03/28/2010   Osteoporosis, post-menopausal 03/28/2010   REFERRING PROVIDER: Gabriel Earing, FNP   REFERRING DIAG: Chronic bilateral thoracic back pain   Rationale for Evaluation and Treatment: Rehabilitation  THERAPY DIAG:  Pain in thoracic spine  Muscle weakness (generalized)  ONSET DATE: 07/30/23  SUBJECTIVE:                                                                                                                                                                                           SUBJECTIVE STATEMENT: Patient reports  5/10 mid-back pain today  PERTINENT HISTORY:  Atrial fibrillation, osteoporosis, allergies, hypertension, osteoarthritis, and history of thyroid  cancer  PAIN:  Are you having pain? Yes: NPRS scale: Current: 5/10 Best: 2/10 Worst: 8/10 Pain location: Thoracic spine (R>L) Pain description: sore Aggravating factors: bending over, vacuuming, sweeping, pulling Relieving factors: sitting and medication  PRECAUTIONS: Fall  RED FLAGS: None   WEIGHT BEARING RESTRICTIONS: No  FALLS:  Has patient fallen in last 6 months? Yes. Number of falls 1  LIVING ENVIRONMENT: Lives with: lives with their spouse Lives in: House/apartment Has following equipment at home: None  OCCUPATION: retired  PLOF: Independent  PATIENT GOALS: reduced pain, be able to do most activities around the house, improved sleep (prefers to lay supine, but cannot due to pain), and improved mobility  NEXT MD VISIT: 01/15/24  OBJECTIVE:  Note: Objective measures were completed at Evaluation unless otherwise noted.  DIAGNOSTIC FINDINGS: 10/22/23 rib x-ray IMPRESSION: Negative.  PATIENT SURVEYS:  FOTO 53.57  COGNITION: Overall cognitive status: Within functional limits for tasks assessed     SENSATION: Patient reports no numbness or tingling  POSTURE: rounded shoulders, forward head, decreased lumbar lordosis, and increased thoracic kyphosis  PALPATION: TTP: right thoracic paraspinals and T11 spinous process  LUMBAR ROM:   AROM eval  Flexion 70  Extension 22  Right lateral flexion 25% limited  Left lateral flexion 50% limited; familiar pain  Right rotation 25% limited  Left rotation No limitation   (Blank rows = not tested)  LOWER EXTREMITY ROM: WFL for activities assessed   LOWER EXTREMITY MMT:    MMT Right eval Left eval  Hip flexion 4-/5 3/5  Hip extension    Hip abduction    Hip adduction    Hip internal rotation    Hip external rotation    Knee flexion 4+/5 4+/5  Knee extension 4+/5 5/5  Ankle dorsiflexion 4-/5 4-/5  Ankle plantarflexion    Ankle inversion    Ankle eversion     (  Blank rows = not tested)  GAIT: Assistive  device utilized: None Level of assistance: Complete Independence Comments: no significant gait deviations observed  TREATMENT DATE:         11-29-23                                      EXERCISE LOG   Mid-back  Exercise Repetitions and Resistance Comments  Nustep  L3 x 5 mins seat 9   Rows Yellow 3x10   Ext Yellow 3x10            Blank cell = exercise not performed today                                                                                                                     Manual STW to bilateral thoracic paras with Pt seated  and leaning forward on plinth x 12 mins IFC x15 mins  80-150 hz to Bil thoracic paras with HMP while seated     PATIENT EDUCATION:  Education details: plan of care, prognosis, healing, anatomy, and goals for physical therapy Person educated: Patient Education method: Explanation Education comprehension: verbalized understanding  HOME EXERCISE PROGRAM:   ASSESSMENT:   CLINICAL IMPRESSION: Patient arrived today doing fairly well with mid-back pain at 5/10. She was able to perform Posture exs with yellow t-band rows and ext and did well. STW to Bil. Thoracic paras with notable tenderness each side. IFC and HMP tolerated well. Handout given with yellow tband given.    OBJECTIVE IMPAIRMENTS: decreased activity tolerance, decreased ROM, decreased strength, hypomobility, impaired tone, postural dysfunction, and pain.   ACTIVITY LIMITATIONS: carrying, lifting, and sleeping  PARTICIPATION LIMITATIONS: cleaning and shopping  PERSONAL FACTORS: Past/current experiences, Time since onset of injury/illness/exacerbation, and 3+ comorbidities: Atrial fibrillation, osteoporosis, allergies, hypertension, osteoarthritis, and history of thyroid cancer  are also affecting patient's functional outcome.   REHAB POTENTIAL: Good  CLINICAL DECISION MAKING: Evolving/moderate complexity  EVALUATION COMPLEXITY: Moderate   GOALS: Goals reviewed with patient?  Yes  LONG TERM GOALS: Target date: 12/23/23  Patient will be independent with her HEP. Baseline:  Goal status: INITIAL  2.  Patient will be able to complete her daily activities without her familiar symptoms exceeding 6/10. Baseline:  Goal status: INITIAL  3.  Patient will report being able to sit for at least 45 minutes without being limited by her familiar symptoms to return to attending her regular church services. Baseline:  Goal status: INITIAL  4.  Patient will report being able to sleep without being awakened by her familiar symptoms. Baseline:  Goal status: INITIAL  5.  Patient will improve her left hip flexor strength to at least 4-/5 for improved functional mobility. Baseline:  Goal status: INITIAL  PLAN:  PT FREQUENCY: 2x/week  PT DURATION: 4 weeks  PLANNED INTERVENTIONS: 97164- PT Re-evaluation, 97110-Therapeutic exercises, 97530- Therapeutic activity, 97112- Neuromuscular re-education, 97535- Self Care, 09811- Manual therapy,  28413- Electrical stimulation (unattended), [id number]- Ultrasound, Patient/Family education, Balance training, Dry Needling, Joint mobilization, Spinal mobilization, Cryotherapy, and Moist heat.  PLAN FOR NEXT SESSION: NuStep, rows, postural reeducation, manual therapy, and modalities as needed   Avy Barlett,CHRIS, PTA 11/29/2023, 12:18 PM

## 2023-12-03 ENCOUNTER — Encounter: Payer: Self-pay | Admitting: *Deleted

## 2023-12-03 ENCOUNTER — Ambulatory Visit: Payer: PPO | Admitting: *Deleted

## 2023-12-03 DIAGNOSIS — M546 Pain in thoracic spine: Secondary | ICD-10-CM

## 2023-12-03 DIAGNOSIS — M6281 Muscle weakness (generalized): Secondary | ICD-10-CM

## 2023-12-03 NOTE — Therapy (Signed)
OUTPATIENT PHYSICAL THERAPY THORACOLUMBAR TREATMENT   Patient Name: [name] MRN: [id number] DOB:[birth year], 78 y.o., female Today's Date: 12/03/2023  END OF SESSION:  PT End of Session - 12/03/23 1105     Visit Number 3    Number of Visits 8    Date for PT Re-Evaluation 01/31/24    PT Start Time 1100    PT Stop Time 1152    PT Time Calculation (min) 52 min             Past Medical History:  Diagnosis Date   Allergy    Atrial fibrillation (HCC)    PT ON FLECANIDE -DID NOT WANT TO BE ON BLOOD THINNERS OTHER THAN DAILY ASA- due to adrenal gland issue - no issues since corrected    Blood transfusion without reported diagnosis    Blurry vision, right eye    HX OF RT EYE VISION DISTURBANCE 01/2012 -PT SEEN BY DR. RANKIN--ELEVATED B/P  THOUGHT TO BE RELATED TO [place] SPOTTED FEVER THOUGHT TO BE CAUSE OF VISION PROBLEM-HAD LASER OF BOTH EYES (SLIGHT DAMAGE TO LEFT  EYE).     Cataract    BILATERAL removed    GERD (gastroesophageal reflux disease)    Headache(784.0)    PAST HX MIGRAINES   Hearing loss of right ear    Heart murmur    TR    Hypertension    B/P HAS BEEN UP AND DOWN BUT MOSTLY DOWN WHILE ON METOPROLOL -WAS TAKEN OFF METOROLOL IN NOV 2012 BY CARDIOLOGIST AND THEN IN MARCH 2013 PT'S B/P BECAME REALLY ELEVATED-SHE RESTARTED METOPROLOL  AND WAS GIVEN BENICAR.  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 hx of mets to luns, which was treated  (unsure of how)   TMJ locking    PT STATES IF MOUTH OPEN EXTENDED TIME-LIKE AT DENTIST--HER JAWS LOCK   TR (tricuspid regurgitation)    Vegetarian diet    DOES EAT EGGS AND DAIRY--NO MEATS OR FISH   Past Surgical History:  Procedure Laterality Date   ADRENALECTOMY  07/03/2012   Procedure: ADRENALECTOMY;  Surgeon: Crecencio Mc, MD;  Location: [facility];  Service: Urology;  Laterality: Right;   APPENDECTOMY     Lung biopsies     THYROIDECTOMY     UPPER GASTROINTESTINAL ENDOSCOPY     WRIST SURGERY     Patient Active Problem List   Diagnosis Date Noted   Neck pain, acute 05/30/2023   Early stage nonexudative age-related macular degeneration of both eyes 04/12/2022   Retinal vasculitis of left eye 04/06/2021   LBBB (left bundle branch block) 08/29/2020   Non-rheumatic mitral regurgitation 08/29/2020   Central retinal vein occlusion of right eye 03/21/2020   Non-rheumatic tricuspid valve insufficiency 03/06/2017   Personal history of pheochromocytoma 02/17/2014   Vitamin D deficiency 10/21/2013   Benign tumor of adrenal gland 10/21/2013   Hypothyroidism 10/21/2013  History of thyroid cancer 06/18/2013   Sinus tachycardia 07/06/2012   Atrial fibrillation (HCC) 04/04/2010   Mitral valve disorder 03/28/2010   Osteoporosis, post-menopausal 03/28/2010   REFERRING PROVIDER: Gabriel Earing, FNP   REFERRING DIAG: Chronic bilateral thoracic back pain   Rationale for Evaluation and Treatment: Rehabilitation  THERAPY DIAG:  Pain in thoracic spine  Muscle weakness (generalized)  ONSET DATE: 07/30/23  SUBJECTIVE:                                                                                                                                                                                           SUBJECTIVE STATEMENT: Patient reports  3/10 mid-back pain today. Did okay after last Rx  PERTINENT HISTORY:  Atrial fibrillation, osteoporosis, allergies, hypertension, osteoarthritis, and  history of thyroid cancer  PAIN:  Are you having pain? Yes: NPRS scale: Current: 5/10 Best: 2/10 Worst: 8/10 Pain location: Thoracic spine (R>L) Pain description: sore Aggravating factors: bending over, vacuuming, sweeping, pulling Relieving factors: sitting and medication  PRECAUTIONS: Fall  RED FLAGS: None   WEIGHT BEARING RESTRICTIONS: No  FALLS:  Has patient fallen in last 6 months? Yes. Number of falls 1  LIVING ENVIRONMENT: Lives with: lives with their spouse Lives in: House/apartment Has following equipment at home: None  OCCUPATION: retired  PLOF: Independent  PATIENT GOALS: reduced pain, be able to do most activities around the house, improved sleep (prefers to lay supine, but cannot due to pain), and improved mobility  NEXT MD VISIT: 01/15/24  OBJECTIVE:  Note: Objective measures were completed at Evaluation unless otherwise noted.  DIAGNOSTIC FINDINGS: 10/22/23 rib x-ray IMPRESSION: Negative.  PATIENT SURVEYS:  FOTO 53.57  COGNITION: Overall cognitive status: Within functional limits for tasks assessed     SENSATION: Patient reports no numbness or tingling  POSTURE: rounded shoulders, forward head, decreased lumbar lordosis, and increased thoracic kyphosis  PALPATION: TTP: right thoracic paraspinals and T11 spinous process  LUMBAR ROM:   AROM eval  Flexion 70  Extension 22  Right lateral flexion 25% limited  Left lateral flexion 50% limited; familiar pain  Right rotation 25% limited  Left rotation No limitation   (Blank rows = not tested)  LOWER EXTREMITY ROM: WFL for activities assessed   LOWER EXTREMITY MMT:    MMT Right eval Left eval  Hip flexion 4-/5 3/5  Hip extension    Hip abduction    Hip adduction    Hip internal rotation    Hip external rotation    Knee flexion 4+/5 4+/5  Knee extension 4+/5 5/5  Ankle dorsiflexion 4-/5 4-/5  Ankle plantarflexion    Ankle inversion  Ankle eversion     (Blank rows = not  tested)  GAIT: Assistive device utilized: None Level of assistance: Complete Independence Comments: no significant gait deviations observed  TREATMENT DATE:         12-03-23                                      EXERCISE LOG   Mid-back  Exercise Repetitions and Resistance Comments  Nustep  L3 x 7 mins seat 9   Rows Yellow 3x10   Ext Yellow 3x10   H-ABD Yellow x 10        Blank cell = exercise not performed today                                                                                                                     Manual STW to bilateral thoracic paras with Pt seated  and leaning forward on plinth x 12 mins IFC x15 mins  80-150 hz to Bil thoracic paras with HMP while seated     PATIENT EDUCATION:  Education details: plan of care, prognosis, healing, anatomy, and goals for physical therapy Person educated: Patient Education method: Explanation Education comprehension: verbalized understanding  HOME EXERCISE PROGRAM:   ASSESSMENT:   CLINICAL IMPRESSION: Patient arrived today doing fairly well with mid-back pain at 3/10.She reports doing good after last Rx and was able to continue with posture exs and with added H-ABD and did well.  STW to thoracic paras performed with notable decreased mm tension and soreness. HMP and IFC end of session. 2/10   OBJECTIVE IMPAIRMENTS: decreased activity tolerance, decreased ROM, decreased strength, hypomobility, impaired tone, postural dysfunction, and pain.   ACTIVITY LIMITATIONS: carrying, lifting, and sleeping  PARTICIPATION LIMITATIONS: cleaning and shopping  PERSONAL FACTORS: Past/current experiences, Time since onset of injury/illness/exacerbation, and 3+ comorbidities: Atrial fibrillation, osteoporosis, allergies, hypertension, osteoarthritis, and history of thyroid cancer  are also affecting patient's functional outcome.   REHAB POTENTIAL: Good  CLINICAL DECISION MAKING: Evolving/moderate complexity  EVALUATION  COMPLEXITY: Moderate   GOALS: Goals reviewed with patient? Yes  LONG TERM GOALS: Target date: 12/23/23  Patient will be independent with her HEP. Baseline:  Goal status: INITIAL  2.  Patient will be able to complete her daily activities without her familiar symptoms exceeding 6/10. Baseline:  Goal status: INITIAL  3.  Patient will report being able to sit for at least 45 minutes without being limited by her familiar symptoms to return to attending her regular church services. Baseline:  Goal status: INITIAL  4.  Patient will report being able to sleep without being awakened by her familiar symptoms. Baseline:  Goal status: INITIAL  5.  Patient will improve her left hip flexor strength to at least 4-/5 for improved functional mobility. Baseline:  Goal status: INITIAL  PLAN:  PT FREQUENCY: 2x/week  PT DURATION: 4 weeks  PLANNED INTERVENTIONS: 97164- PT Re-evaluation, 97110-Therapeutic exercises, 97530-  Therapeutic activity, [id number]- Neuromuscular re-education, [phone]- Self Care, 82956- Manual therapy, 97014- Electrical stimulation (unattended), 97035- Ultrasound, Patient/Family education, Balance training, Dry Needling, Joint mobilization, Spinal mobilization, Cryotherapy, and Moist heat.  PLAN FOR NEXT SESSION: NuStep, rows, postural reeducation, manual therapy, and modalities as needed   Bella Brummet,CHRIS, PTA 12/03/2023, 11:56 AM

## 2023-12-05 ENCOUNTER — Ambulatory Visit: Payer: PPO | Admitting: *Deleted

## 2023-12-05 DIAGNOSIS — M546 Pain in thoracic spine: Secondary | ICD-10-CM | POA: Diagnosis not present

## 2023-12-05 DIAGNOSIS — M6281 Muscle weakness (generalized): Secondary | ICD-10-CM

## 2023-12-05 NOTE — Therapy (Signed)
OUTPATIENT PHYSICAL THERAPY THORACOLUMBAR TREATMENT   Patient Name: [name] MRN: [id number] DOB:Feb 01, 1946, 78 y.o., female Today's Date: 12/05/2023  END OF SESSION:  PT End of Session - 12/05/23 1438     Visit Number 4    Number of Visits 8    Date for PT Re-Evaluation 01/31/24    PT Start Time 1430    PT Stop Time 1520    PT Time Calculation (min) 50 min             Past Medical History:  Diagnosis Date   Allergy    Atrial fibrillation (HCC)    PT ON FLECANIDE -DID NOT WANT TO BE ON BLOOD THINNERS OTHER THAN DAILY ASA- due to adrenal gland issue - no issues since corrected    Blood transfusion without reported diagnosis    Blurry vision, right eye    HX OF RT EYE VISION DISTURBANCE 01/2012 -PT SEEN BY DR. RANKIN--ELEVATED B/P  THOUGHT TO BE RELATED TO [place] SPOTTED FEVER THOUGHT TO BE CAUSE OF VISION PROBLEM-HAD LASER OF BOTH EYES (SLIGHT DAMAGE TO LEFT  EYE).     Cataract    BILATERAL removed    GERD (gastroesophageal reflux disease)    Headache(784.0)    PAST HX MIGRAINES   Hearing loss of right ear    Heart murmur    TR    Hypertension    B/P HAS BEEN UP AND DOWN BUT MOSTLY DOWN WHILE ON METOPROLOL -WAS TAKEN OFF METOROLOL IN NOV 2012 BY CARDIOLOGIST AND THEN IN MARCH 2013 PT'S B/P BECAME REALLY ELEVATED-SHE RESTARTED METOPROLOL  AND WAS GIVEN BENICAR.  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 hx of mets to luns, which was treated  (unsure of how)   TMJ locking    PT STATES IF MOUTH OPEN EXTENDED TIME-LIKE AT DENTIST--HER JAWS LOCK   TR (tricuspid regurgitation)    Vegetarian diet    DOES EAT EGGS AND DAIRY--NO MEATS OR FISH   Past Surgical History:  Procedure Laterality Date   ADRENALECTOMY  07/03/2012   Procedure: ADRENALECTOMY;  Surgeon: Crecencio Mc, MD;  Location: [facility];  Service: Urology;  Laterality: Right;   APPENDECTOMY     Lung biopsies     THYROIDECTOMY     UPPER GASTROINTESTINAL ENDOSCOPY     WRIST SURGERY     Patient Active Problem List   Diagnosis Date Noted   Neck pain, acute 05/30/2023   Early stage nonexudative age-related macular degeneration of both eyes 04/12/2022   Retinal vasculitis of left eye 04/06/2021   LBBB (left bundle branch block) 08/29/2020   Non-rheumatic mitral regurgitation 08/29/2020   Central retinal vein occlusion of right eye 03/21/2020   Non-rheumatic tricuspid valve insufficiency 03/06/2017   Personal history of pheochromocytoma 02/17/2014   Vitamin D deficiency 10/21/2013   Benign tumor of adrenal gland 10/21/2013   Hypothyroidism 10/21/2013  History of thyroid cancer 06/18/2013   Sinus tachycardia 07/06/2012   Atrial fibrillation (HCC) 04/04/2010   Mitral valve disorder 03/28/2010   Osteoporosis, post-menopausal 03/28/2010   REFERRING PROVIDER: Gabriel Earing, FNP   REFERRING DIAG: Chronic bilateral thoracic back pain   Rationale for Evaluation and Treatment: Rehabilitation  THERAPY DIAG:  Pain in thoracic spine  Muscle weakness (generalized)  ONSET DATE: 07/30/23  SUBJECTIVE:                                                                                                                                                                                           SUBJECTIVE STATEMENT: Patient reports  3/10 mid-back pain today. Did okay after last Rx. Irritated mid-back 7-8/10 pulling the trash can.  PERTINENT HISTORY:  Atrial fibrillation,  osteoporosis, allergies, hypertension, osteoarthritis, and history of thyroid cancer  PAIN:  Are you having pain? Yes: NPRS scale: Current: 3-4/10 Best: 2/10 Worst: 8/10 Pain location: Thoracic spine (R>L) Pain description: sore Aggravating factors: bending over, vacuuming, sweeping, pulling Relieving factors: sitting and medication  PRECAUTIONS: Fall  RED FLAGS: None   WEIGHT BEARING RESTRICTIONS: No  FALLS:  Has patient fallen in last 6 months? Yes. Number of falls 1  LIVING ENVIRONMENT: Lives with: lives with their spouse Lives in: House/apartment Has following equipment at home: None  OCCUPATION: retired  PLOF: Independent  PATIENT GOALS: reduced pain, be able to do most activities around the house, improved sleep (prefers to lay supine, but cannot due to pain), and improved mobility  NEXT MD VISIT: 01/15/24  OBJECTIVE:  Note: Objective measures were completed at Evaluation unless otherwise noted.  DIAGNOSTIC FINDINGS: 10/22/23 rib x-ray IMPRESSION: Negative.  PATIENT SURVEYS:  FOTO 53.57  COGNITION: Overall cognitive status: Within functional limits for tasks assessed     SENSATION: Patient reports no numbness or tingling  POSTURE: rounded shoulders, forward head, decreased lumbar lordosis, and increased thoracic kyphosis  PALPATION: TTP: right thoracic paraspinals and T11 spinous process  LUMBAR ROM:   AROM eval  Flexion 70  Extension 22  Right lateral flexion 25% limited  Left lateral flexion 50% limited; familiar pain  Right rotation 25% limited  Left rotation No limitation   (Blank rows = not tested)  LOWER EXTREMITY ROM: WFL for activities assessed   LOWER EXTREMITY MMT:    MMT Right eval Left eval  Hip flexion 4-/5 3/5  Hip extension    Hip abduction    Hip adduction    Hip internal rotation    Hip external rotation    Knee flexion 4+/5 4+/5  Knee extension 4+/5 5/5  Ankle dorsiflexion 4-/5 4-/5  Ankle  plantarflexion    Ankle  inversion    Ankle eversion     (Blank rows = not tested)  GAIT: Assistive device utilized: None Level of assistance: Complete Independence Comments: no significant gait deviations observed  TREATMENT DATE:         12-05-23                                      EXERCISE LOG   Mid-back  Exercise Repetitions and Resistance Comments  Nustep  L4 x 8 mins seat 9   Rows Red   3 x10   Ext RED   3  x10   H-ABD RED   2  x 10        Blank cell = exercise not performed today                                                                                                                       Manual STW to bilateral thoracic paras with Pt seated  and leaning forward on plinth x 12 mins  IFC x15 mins  80-150 hz to Bil thoracic paras with HMP while seated     PATIENT EDUCATION:  Education details: plan of care, prognosis, healing, anatomy, and goals for physical therapy Person educated: Patient Education method: Explanation Education comprehension: verbalized understanding  HOME EXERCISE PROGRAM:   ASSESSMENT:   CLINICAL IMPRESSION: Patient arrived today doing fairly well with mid-back pain at 3/10.She reports doing good after last Rx , but strained mid-back pulling the trash can down to the road. [place] combo performed today to thoracic paras and was tolerated very well with decreased soreness end of session. 2/10 end of Rx   OBJECTIVE IMPAIRMENTS: decreased activity tolerance, decreased ROM, decreased strength, hypomobility, impaired tone, postural dysfunction, and pain.   ACTIVITY LIMITATIONS: carrying, lifting, and sleeping  PARTICIPATION LIMITATIONS: cleaning and shopping  PERSONAL FACTORS: Past/current experiences, Time since onset of injury/illness/exacerbation, and 3+ comorbidities: Atrial fibrillation, osteoporosis, allergies, hypertension, osteoarthritis, and history of thyroid cancer  are also affecting patient's functional outcome.   REHAB POTENTIAL: Good  CLINICAL  DECISION MAKING: Evolving/moderate complexity  EVALUATION COMPLEXITY: Moderate   GOALS: Goals reviewed with patient? Yes  LONG TERM GOALS: Target date: 12/23/23  Patient will be independent with her HEP. Baseline:  Goal status:  MET  2.  Patient will be able to complete her daily activities without her familiar symptoms exceeding 6/10. Baseline:  Goal status: On going  3.  Patient will report being able to sit for at least 45 minutes without being limited by her familiar symptoms to return to attending her regular church services. Baseline:  Goal status: On going  4.  Patient will report being able to sleep without being awakened by her familiar symptoms. Baseline:  Goal status: On going  5.  Patient will improve her left hip flexor strength to at least 4-/5 for improved functional  mobility. Baseline:  Goal status: On going  PLAN:  PT FREQUENCY: 2x/week  PT DURATION: 4 weeks  PLANNED INTERVENTIONS: 97164- PT Re-evaluation, 97110-Therapeutic exercises, 97530- Therapeutic activity, 97112- Neuromuscular re-education, 97535- Self Care, 16109- Manual therapy, 97014- Electrical stimulation (unattended), 97035- Ultrasound, Patient/Family education, Balance training, Dry Needling, Joint mobilization, Spinal mobilization, Cryotherapy, and Moist heat.  PLAN FOR NEXT SESSION: NuStep, rows, postural reeducation, manual therapy, and modalities as needed   Avaiah Stempel,CHRIS, PTA 12/05/2023, 4:34 PM

## 2023-12-09 ENCOUNTER — Ambulatory Visit: Payer: PPO | Attending: Family Medicine

## 2023-12-09 DIAGNOSIS — M546 Pain in thoracic spine: Secondary | ICD-10-CM | POA: Insufficient documentation

## 2023-12-09 DIAGNOSIS — M6281 Muscle weakness (generalized): Secondary | ICD-10-CM | POA: Diagnosis not present

## 2023-12-09 NOTE — Therapy (Signed)
OUTPATIENT PHYSICAL THERAPY THORACOLUMBAR TREATMENT   Patient Name: [name] MRN: [id number] DOB:02-Jul-1946, 78 y.o., female Today's Date: 12/09/2023  END OF SESSION:  PT End of Session - 12/09/23 1521     Visit Number 5    Number of Visits 8    Date for PT Re-Evaluation 01/31/24    PT Start Time 1515    PT Stop Time 1551    PT Time Calculation (min) 36 min             Past Medical History:  Diagnosis Date   Allergy    Atrial fibrillation (HCC)    PT ON FLECANIDE -DID NOT WANT TO BE ON BLOOD THINNERS OTHER THAN DAILY ASA- due to adrenal gland issue - no issues since corrected    Blood transfusion without reported diagnosis    Blurry vision, right eye    HX OF RT EYE VISION DISTURBANCE 01/2012 -PT SEEN BY DR. RANKIN--ELEVATED B/P  THOUGHT TO BE RELATED TO [place] SPOTTED FEVER THOUGHT TO BE CAUSE OF VISION PROBLEM-HAD LASER OF BOTH EYES (SLIGHT DAMAGE TO LEFT  EYE).     Cataract    BILATERAL removed    GERD (gastroesophageal reflux disease)    Headache(784.0)    PAST HX MIGRAINES   Hearing loss of right ear    Heart murmur    TR    Hypertension    B/P HAS BEEN UP AND DOWN BUT MOSTLY DOWN WHILE ON METOPROLOL -WAS TAKEN OFF METOROLOL IN NOV 2012 BY CARDIOLOGIST AND THEN IN MARCH 2013 PT'S B/P BECAME REALLY ELEVATED-SHE RESTARTED METOPROLOL  AND WAS GIVEN BENICAR.  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 hx of mets to luns, which was treated  (unsure of how)   TMJ locking    PT STATES IF MOUTH OPEN EXTENDED TIME-LIKE AT DENTIST--HER JAWS LOCK   TR (tricuspid regurgitation)    Vegetarian diet    DOES EAT EGGS AND DAIRY--NO MEATS OR FISH   Past Surgical History:  Procedure Laterality Date   ADRENALECTOMY  07/03/2012   Procedure: ADRENALECTOMY;  Surgeon: Crecencio Mc, MD;  Location: [facility];  Service: Urology;  Laterality: Right;   APPENDECTOMY     Lung biopsies     THYROIDECTOMY     UPPER GASTROINTESTINAL ENDOSCOPY     WRIST SURGERY     Patient Active Problem List   Diagnosis Date Noted   Neck pain, acute 05/30/2023   Early stage nonexudative age-related macular degeneration of both eyes 04/12/2022   Retinal vasculitis of left eye 04/06/2021   LBBB (left bundle branch block) 08/29/2020   Non-rheumatic mitral regurgitation 08/29/2020   Central retinal vein occlusion of right eye 03/21/2020   Non-rheumatic tricuspid valve insufficiency 03/06/2017   Personal history of pheochromocytoma 02/17/2014   Vitamin D deficiency 10/21/2013   Benign tumor of adrenal gland 10/21/2013   Hypothyroidism 10/21/2013  History of thyroid cancer 06/18/2013   Sinus tachycardia 07/06/2012   Atrial fibrillation (HCC) 04/04/2010   Mitral valve disorder 03/28/2010   Osteoporosis, post-menopausal 03/28/2010   REFERRING PROVIDER: Gabriel Earing, FNP   REFERRING DIAG: Chronic bilateral thoracic back pain   Rationale for Evaluation and Treatment: Rehabilitation  THERAPY DIAG:  Pain in thoracic spine  Muscle weakness (generalized)  ONSET DATE: 07/30/23  SUBJECTIVE:                                                                                                                                                                                           SUBJECTIVE STATEMENT: Pt reports 6-7/10 mid back pain today  PERTINENT HISTORY:  Atrial fibrillation, osteoporosis, allergies, hypertension, osteoarthritis, and history of thyroid  cancer  PAIN:  Are you having pain? Yes: NPRS scale: 6-7/10 Pain location: Thoracic spine (R>L) Pain description: sore Aggravating factors: bending over, vacuuming, sweeping, pulling Relieving factors: sitting and medication  PRECAUTIONS: Fall  RED FLAGS: None   WEIGHT BEARING RESTRICTIONS: No  FALLS:  Has patient fallen in last 6 months? Yes. Number of falls 1  LIVING ENVIRONMENT: Lives with: lives with their spouse Lives in: House/apartment Has following equipment at home: None  OCCUPATION: retired  PLOF: Independent  PATIENT GOALS: reduced pain, be able to do most activities around the house, improved sleep (prefers to lay supine, but cannot due to pain), and improved mobility  NEXT MD VISIT: 01/15/24  OBJECTIVE:  Note: Objective measures were completed at Evaluation unless otherwise noted.  DIAGNOSTIC FINDINGS: 10/22/23 rib x-ray IMPRESSION: Negative.  PATIENT SURVEYS:  FOTO 53.57  COGNITION: Overall cognitive status: Within functional limits for tasks assessed     SENSATION: Patient reports no numbness or tingling  POSTURE: rounded shoulders, forward head, decreased lumbar lordosis, and increased thoracic kyphosis  PALPATION: TTP: right thoracic paraspinals and T11 spinous process  LUMBAR ROM:   AROM eval  Flexion 70  Extension 22  Right lateral flexion 25% limited  Left lateral flexion 50% limited; familiar pain  Right rotation 25% limited  Left rotation No limitation   (Blank rows = not tested)  LOWER EXTREMITY ROM: WFL for activities assessed   LOWER EXTREMITY MMT:    MMT Right eval Left eval  Hip flexion 4-/5 3/5  Hip extension    Hip abduction    Hip adduction    Hip internal rotation    Hip external rotation    Knee flexion 4+/5 4+/5  Knee extension 4+/5 5/5  Ankle dorsiflexion 4-/5 4-/5  Ankle plantarflexion    Ankle inversion    Ankle eversion     (Blank rows =  not tested)  GAIT: Assistive device utilized: None Level  of assistance: Complete Independence Comments: no significant gait deviations observed  TREATMENT DATE:           12/09/23                                     EXERCISE LOG   Mid-back  Exercise Repetitions and Resistance Comments  Nustep  L4 x 12 mins seat 9   Rows Red   3 x10   Ext RED   3  x10   H-ABD         Blank cell = exercise not performed today  Modalities  Date:  Hot Pack: Lumbar, 8 mins, Pain and Tone   PATIENT EDUCATION:  Education details: plan of care, prognosis, healing, anatomy, and goals for physical therapy Person educated: Patient Education method: Explanation Education comprehension: verbalized understanding  HOME EXERCISE PROGRAM:   ASSESSMENT:   CLINICAL IMPRESSION: Patient arrives for today's treatment session reporting 6-7/10 back pain.  Pt states that she did a lot of laundry and vacuuming over the weekend which has increased her back pain.  Due to increased back pain pt declined all manual therapy and modalities.  Pt also requested to not perform horizontal abduction today due to chest pain the exercise caused when performing it at home.   Session completed early due to pt request.  Pt reported decreased pain at completion of today's treatment session.   OBJECTIVE IMPAIRMENTS: decreased activity tolerance, decreased ROM, decreased strength, hypomobility, impaired tone, postural dysfunction, and pain.   ACTIVITY LIMITATIONS: carrying, lifting, and sleeping  PARTICIPATION LIMITATIONS: cleaning and shopping  PERSONAL FACTORS: Past/current experiences, Time since onset of injury/illness/exacerbation, and 3+ comorbidities: Atrial fibrillation, osteoporosis, allergies, hypertension, osteoarthritis, and history of thyroid cancer  are also affecting patient's functional outcome.   REHAB POTENTIAL: Good  CLINICAL DECISION MAKING: Evolving/moderate complexity  EVALUATION COMPLEXITY: Moderate   GOALS: Goals reviewed with patient? Yes  LONG TERM GOALS:  Target date: 12/23/23  Patient will be independent with her HEP. Baseline:  Goal status:  MET  2.  Patient will be able to complete her daily activities without her familiar symptoms exceeding 6/10. Baseline:  Goal status: On going  3.  Patient will report being able to sit for at least 45 minutes without being limited by her familiar symptoms to return to attending her regular church services. Baseline:  Goal status: On going  4.  Patient will report being able to sleep without being awakened by her familiar symptoms. Baseline:  Goal status: On going  5.  Patient will improve her left hip flexor strength to at least 4-/5 for improved functional mobility. Baseline:  Goal status: On going  PLAN:  PT FREQUENCY: 2x/week  PT DURATION: 4 weeks  PLANNED INTERVENTIONS: 97164- PT Re-evaluation, 97110-Therapeutic exercises, 97530- Therapeutic activity, 97112- Neuromuscular re-education, 97535- Self Care, 16109- Manual therapy, 97014- Electrical stimulation (unattended), 97035- Ultrasound, Patient/Family education, Balance training, Dry Needling, Joint mobilization, Spinal mobilization, Cryotherapy, and Moist heat.  PLAN FOR NEXT SESSION: NuStep, rows, postural reeducation, manual therapy, and modalities as needed  Newman Pies, PTA 12/09/2023, 3:54 PM

## 2023-12-12 ENCOUNTER — Ambulatory Visit: Payer: PPO

## 2023-12-12 DIAGNOSIS — M6281 Muscle weakness (generalized): Secondary | ICD-10-CM

## 2023-12-12 DIAGNOSIS — M546 Pain in thoracic spine: Secondary | ICD-10-CM

## 2023-12-12 NOTE — Therapy (Signed)
 OUTPATIENT PHYSICAL THERAPY THORACOLUMBAR TREATMENT   Patient Name: [name] MRN: [id number] DOB:[birth year], 78 y.o., female Today's Date: 12/12/2023  END OF SESSION:  PT End of Session - 12/12/23 1443     Visit Number 6    Number of Visits 8    Date for PT Re-Evaluation 01/31/24    PT Start Time 1430    PT Stop Time 1511    PT Time Calculation (min) 41 min    Activity Tolerance Patient tolerated treatment well    Behavior During Therapy WFL for tasks assessed/performed              Past Medical History:  Diagnosis Date   Allergy    Atrial fibrillation (HCC)    PT ON FLECANIDE -DID NOT WANT TO BE ON BLOOD THINNERS OTHER THAN DAILY ASA- due to adrenal gland issue - no issues since corrected    Blood transfusion without reported diagnosis    Blurry vision, right eye    HX OF RT EYE VISION DISTURBANCE 01/2012 -PT SEEN BY DR. RANKIN--ELEVATED B/P  THOUGHT TO BE RELATED TO [place] SPOTTED FEVER THOUGHT TO BE CAUSE OF VISION PROBLEM-HAD LASER OF BOTH EYES (SLIGHT DAMAGE TO LEFT  EYE).     Cataract    BILATERAL removed    GERD (gastroesophageal reflux disease)    Headache(784.0)    PAST HX MIGRAINES   Hearing loss of right ear    Heart murmur    TR    Hypertension    B/P HAS BEEN UP AND DOWN BUT MOSTLY DOWN WHILE ON METOPROLOL  -WAS TAKEN OFF METOROLOL IN NOV 2012 BY CARDIOLOGIST AND THEN IN MARCH 2013 PT'S B/P BECAME REALLY ELEVATED-SHE RESTARTED METOPROLOL   AND WAS GIVEN BENICAR.  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 hx of mets to luns, which was treated (unsure of how)   TMJ locking    PT STATES IF MOUTH OPEN EXTENDED TIME-LIKE AT DENTIST--HER JAWS LOCK   TR (tricuspid regurgitation)    Vegetarian diet    DOES EAT EGGS AND DAIRY--NO MEATS OR FISH   Past Surgical History:  Procedure Laterality Date   ADRENALECTOMY  07/03/2012   Procedure: ADRENALECTOMY;  Surgeon: Noretta Ferrara, MD;  Location: [facility];  Service: Urology;  Laterality: Right;   APPENDECTOMY     Lung biopsies     THYROIDECTOMY     UPPER GASTROINTESTINAL ENDOSCOPY     WRIST SURGERY     Patient Active Problem List   Diagnosis Date Noted   Neck pain, acute 05/30/2023   Early stage nonexudative age-related macular degeneration of both eyes 04/12/2022   Retinal vasculitis of left eye 04/06/2021   LBBB (left bundle branch block) 08/29/2020   Non-rheumatic mitral regurgitation 08/29/2020   Central retinal vein occlusion of right eye 03/21/2020   Non-rheumatic tricuspid valve insufficiency 03/06/2017   Personal history of pheochromocytoma  02/17/2014   Vitamin D  deficiency 10/21/2013   Benign tumor of adrenal gland 10/21/2013   Hypothyroidism 10/21/2013   History of thyroid  cancer 06/18/2013   Sinus tachycardia 07/06/2012   Atrial fibrillation (HCC) 04/04/2010   Mitral valve disorder 03/28/2010   Osteoporosis, post-menopausal 03/28/2010   REFERRING PROVIDER: Joesph Annabella HERO, FNP   REFERRING DIAG: Chronic bilateral thoracic back pain   Rationale for Evaluation and Treatment: Rehabilitation  THERAPY DIAG:  Pain in thoracic spine  Muscle weakness (generalized)  ONSET DATE: 07/30/23  SUBJECTIVE:                                                                                                                                                                                           SUBJECTIVE STATEMENT: Patient reports that she feels a lot better today.    PERTINENT HISTORY:  Atrial fibrillation, osteoporosis, allergies, hypertension, osteoarthritis, and history of thyroid  cancer  PAIN:  Are you having pain? Yes: NPRS scale: 1-2/10 Pain location: Thoracic spine (R>L) Pain description: sore Aggravating factors: bending over, vacuuming, sweeping, pulling Relieving factors: sitting and medication  PRECAUTIONS: Fall  RED FLAGS: None   WEIGHT BEARING RESTRICTIONS: No  FALLS:  Has patient fallen in last 6 months? Yes. Number of falls 1  LIVING ENVIRONMENT: Lives with: lives with their spouse Lives in: House/apartment Has following equipment at home: None  OCCUPATION: retired  PLOF: Independent  PATIENT GOALS: reduced pain, be able to do most activities around the house, improved sleep (prefers to lay supine, but cannot due to pain), and improved mobility  NEXT MD VISIT: 01/15/24  OBJECTIVE:  Note: Objective measures were completed at Evaluation unless otherwise noted.  DIAGNOSTIC FINDINGS: 10/22/23 rib x-ray IMPRESSION: Negative.  PATIENT SURVEYS:  FOTO 53.57  COGNITION: Overall cognitive status: Within functional limits for tasks assessed     SENSATION: Patient reports no numbness or tingling  POSTURE: rounded shoulders, forward head, decreased lumbar lordosis, and increased thoracic kyphosis  PALPATION: TTP: right thoracic paraspinals and T11 spinous process  LUMBAR ROM:   AROM eval  Flexion 70  Extension 22  Right lateral flexion 25% limited  Left lateral flexion 50% limited; familiar pain  Right rotation 25% limited  Left rotation No limitation   (Blank rows = not tested)  LOWER EXTREMITY ROM: WFL for activities assessed   LOWER EXTREMITY MMT:    MMT Right eval Left eval  Hip flexion 4-/5 3/5  Hip extension    Hip abduction    Hip adduction    Hip internal rotation    Hip external rotation    Knee flexion 4+/5 4+/5  Knee extension 4+/5 5/5  Ankle dorsiflexion 4-/5 4-/5  Ankle  plantarflexion    Ankle inversion    Ankle eversion     (Blank rows = not tested)  GAIT: Assistive device utilized: None Level of assistance: Complete Independence Comments: no significant gait deviations observed  TREATMENT DATE:                                            12/12/23 EXERCISE LOG  Exercise Repetitions and Resistance Comments  Nustep  L4 x 15 minutes  BUE and BLE   Resisted row (unilateral) Red t-band x 20 reps each    Resisted pull down (unilateral)  Red t-band x 20 reps each    Resisted multifidus rotation  Red t-band x 20 reps each    Manual therapy      Blank cell = exercise not performed today  Manual Therapy Soft Tissue Mobilization: left thoracic paraspinals, for reduced pain and tone Joint Mobilizations: T8-12, CPA's grade I-II    12/09/23  EXERCISE LOG   Mid-back  Exercise Repetitions and Resistance Comments  Nustep  L4 x 12 mins seat 9   Rows Red   3 x10   Ext RED   3  x10   H-ABD         Blank cell = exercise not performed today  Modalities  Date:  Hot Pack: Lumbar, 8 mins, Pain and Tone   PATIENT EDUCATION:  Education details: plan of care, prognosis, healing, anatomy, and goals for physical therapy Person educated: Patient Education method: Explanation Education comprehension: verbalized understanding  HOME EXERCISE PROGRAM:   ASSESSMENT:   CLINICAL IMPRESSION: Patient was progressed with resisted multifidus rotations for improved thoracolumbar strength. She required minimal cueing with resisted rows for proper exercise performance to prevent lumbar rotation. Manual therapy focused on soft tissue mobilization to her left thoracic paraspinals as this was the most effective at reducing her familiar symptoms. She reported feeling better as she was barely hurting any upon the conclusion of treatment. She continues to require skilled physical therapy to address her remaining impairments to return to her prior level of function.    OBJECTIVE  IMPAIRMENTS: decreased activity tolerance, decreased ROM, decreased strength, hypomobility, impaired tone, postural dysfunction, and pain.   ACTIVITY LIMITATIONS: carrying, lifting, and sleeping  PARTICIPATION LIMITATIONS: cleaning and shopping  PERSONAL FACTORS: Past/current experiences, Time since onset of injury/illness/exacerbation, and 3+ comorbidities: Atrial fibrillation, osteoporosis, allergies, hypertension, osteoarthritis, and history of thyroid  cancer  are also affecting patient's functional outcome.   REHAB POTENTIAL: Good  CLINICAL DECISION MAKING: Evolving/moderate complexity  EVALUATION COMPLEXITY: Moderate   GOALS: Goals reviewed with patient? Yes  LONG TERM GOALS: Target date: 12/23/23  Patient will be independent with her HEP. Baseline:  Goal status:  MET  2.  Patient will be able to complete her daily activities without her familiar symptoms exceeding 6/10. Baseline:  Goal status: On going  3.  Patient will report being able to sit for at least 45 minutes without being limited by her familiar symptoms to return to attending her regular church services. Baseline:  Goal status: On going  4.  Patient will report being able to sleep without being awakened by her familiar symptoms. Baseline:  Goal status: On going  5.  Patient will improve her left hip flexor strength to at least 4-/5 for improved functional mobility. Baseline:  Goal status: On going  PLAN:  PT FREQUENCY: 2x/week  PT DURATION: 4 weeks  PLANNED INTERVENTIONS: 97164- PT Re-evaluation, 97110-Therapeutic exercises, 97530- Therapeutic activity, 97112- Neuromuscular re-education, 97535- Self Care, [id number]- Manual therapy, 97014- Electrical stimulation (unattended), 97035- Ultrasound, Patient/Family education, Balance training, Dry Needling, Joint mobilization, Spinal mobilization, Cryotherapy, and Moist heat.  PLAN FOR NEXT SESSION: NuStep, rows, postural reeducation, manual therapy, and  modalities as needed  Lacinda JAYSON Fass, PT 12/12/2023, 6:16 PM

## 2023-12-16 ENCOUNTER — Ambulatory Visit: Payer: PPO | Admitting: *Deleted

## 2023-12-16 DIAGNOSIS — M546 Pain in thoracic spine: Secondary | ICD-10-CM | POA: Diagnosis not present

## 2023-12-16 DIAGNOSIS — M6281 Muscle weakness (generalized): Secondary | ICD-10-CM

## 2023-12-16 NOTE — Therapy (Signed)
 OUTPATIENT PHYSICAL THERAPY THORACOLUMBAR TREATMENT   Patient Name: [name] MRN: [id number] DOB:Jan 10, 1946, 78 y.o., female Today's Date: 12/16/2023  END OF SESSION:  PT End of Session - 12/16/23 1352     Visit Number 7    Number of Visits 8    Date for PT Re-Evaluation 01/31/24    PT Start Time 1345    PT Stop Time 1435    PT Time Calculation (min) 50 min              Past Medical History:  Diagnosis Date   Allergy    Atrial fibrillation (HCC)    PT ON FLECANIDE -DID NOT WANT TO BE ON BLOOD THINNERS OTHER THAN DAILY ASA- due to adrenal gland issue - no issues since corrected    Blood transfusion without reported diagnosis    Blurry vision, right eye    HX OF RT EYE VISION DISTURBANCE 01/2012 -PT SEEN BY DR. RANKIN--ELEVATED B/P  THOUGHT TO BE RELATED TO [place] SPOTTED FEVER THOUGHT TO BE CAUSE OF VISION PROBLEM-HAD LASER OF BOTH EYES (SLIGHT DAMAGE TO LEFT  EYE).     Cataract    BILATERAL removed    GERD (gastroesophageal reflux disease)    Headache(784.0)    PAST HX MIGRAINES   Hearing loss of right ear    Heart murmur    TR    Hypertension    B/P HAS BEEN UP AND DOWN BUT MOSTLY DOWN WHILE ON METOPROLOL  -WAS TAKEN OFF METOROLOL IN NOV 2012 BY CARDIOLOGIST AND THEN IN MARCH 2013 PT'S B/P BECAME REALLY ELEVATED-SHE RESTARTED METOPROLOL   AND WAS GIVEN BENICAR.  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 hx of mets to luns, which was treated  (unsure of how)   TMJ locking    PT STATES IF MOUTH OPEN EXTENDED TIME-LIKE AT DENTIST--HER JAWS LOCK   TR (tricuspid regurgitation)    Vegetarian diet    DOES EAT EGGS AND DAIRY--NO MEATS OR FISH   Past Surgical History:  Procedure Laterality Date   ADRENALECTOMY  07/03/2012   Procedure: ADRENALECTOMY;  Surgeon: Kristeen Peto, MD;  Location: [facility];  Service: Urology;  Laterality: Right;   APPENDECTOMY     Lung biopsies     THYROIDECTOMY     UPPER GASTROINTESTINAL ENDOSCOPY     WRIST SURGERY     Patient Active Problem List   Diagnosis Date Noted   Neck pain, acute 05/30/2023   Early stage nonexudative age-related macular degeneration of both eyes 04/12/2022   Retinal vasculitis of left eye 04/06/2021   LBBB (left bundle branch block) 08/29/2020   Non-rheumatic mitral regurgitation 08/29/2020   Central retinal vein occlusion of right eye 03/21/2020   Non-rheumatic tricuspid valve insufficiency 03/06/2017   Personal history of pheochromocytoma 02/17/2014   Vitamin D  deficiency 10/21/2013   Benign tumor of adrenal gland 10/21/2013   Hypothyroidism 10/21/2013  History of thyroid  cancer 06/18/2013   Sinus tachycardia 07/06/2012   Atrial fibrillation (HCC) 04/04/2010   Mitral valve disorder 03/28/2010   Osteoporosis, post-menopausal 03/28/2010   REFERRING PROVIDER: Albertha Huger, FNP   REFERRING DIAG: Chronic bilateral thoracic back pain   Rationale for Evaluation and Treatment: Rehabilitation  THERAPY DIAG:  Pain in thoracic spine  Muscle weakness (generalized)  ONSET DATE: 07/30/23  SUBJECTIVE:                                                                                                                                                                                           SUBJECTIVE STATEMENT: Patient reports that she feels that daily ADL's are getting easier with less pain.  PERTINENT HISTORY:  Atrial fibrillation, osteoporosis, allergies, hypertension,  osteoarthritis, and history of thyroid  cancer  PAIN:  Are you having pain? Yes: NPRS scale: 1-2/10 Pain location: Thoracic spine (R>L) Pain description: sore Aggravating factors: bending over, vacuuming, sweeping, pulling Relieving factors: sitting and medication  PRECAUTIONS: Fall  RED FLAGS: None   WEIGHT BEARING RESTRICTIONS: No  FALLS:  Has patient fallen in last 6 months? Yes. Number of falls 1  LIVING ENVIRONMENT: Lives with: lives with their spouse Lives in: House/apartment Has following equipment at home: None  OCCUPATION: retired  PLOF: Independent  PATIENT GOALS: reduced pain, be able to do most activities around the house, improved sleep (prefers to lay supine, but cannot due to pain), and improved mobility  NEXT MD VISIT: 01/15/24  OBJECTIVE:  Note: Objective measures were completed at Evaluation unless otherwise noted.  DIAGNOSTIC FINDINGS: 10/22/23 rib x-ray IMPRESSION: Negative.  PATIENT SURVEYS:  FOTO 53.57  COGNITION: Overall cognitive status: Within functional limits for tasks assessed     SENSATION: Patient reports no numbness or tingling  POSTURE: rounded shoulders, forward head, decreased lumbar lordosis, and increased thoracic kyphosis  PALPATION: TTP: right thoracic paraspinals and T11 spinous process  LUMBAR ROM:   AROM eval  Flexion 70  Extension 22  Right lateral flexion 25% limited  Left lateral flexion 50% limited; familiar pain  Right rotation 25% limited  Left rotation No limitation   (Blank rows = not tested)  LOWER EXTREMITY ROM: WFL for activities assessed   LOWER EXTREMITY MMT:    MMT Right eval Left eval  Hip flexion 4-/5 3/5  Hip extension    Hip abduction    Hip adduction    Hip internal rotation    Hip external rotation    Knee flexion 4+/5 4+/5  Knee extension 4+/5 5/5  Ankle dorsiflexion 4-/5 4-/5  Ankle plantarflexion    Ankle inversion    Ankle  eversion     (Blank rows = not  tested)  GAIT: Assistive device utilized: None Level of assistance: Complete Independence Comments: no significant gait deviations observed  TREATMENT DATE:                                            12/16/23 EXERCISE LOG  Exercise Repetitions and Resistance Comments  Nustep   BUE and BLE   UBE X 120 RPM   Resisted row  Red t-band 3 x12 reps each    Resisted pull down  Red t-band  3x12 reps each    Resisted multifidus rotation  Red t-band   2x10 reps each    Manual therapy     Habd  Red tband x12, x 10    Blank cell = exercise not performed today  Manual Therapy Soft Tissue Mobilization: thoracic paraspinals, for reduced pain and tone Joint Mobilizations:  ,     IFC x 15 mins 80-150hz  to thoracic paras seated   12/09/23  EXERCISE LOG   Mid-back  Exercise Repetitions and Resistance Comments  Nustep  L4 x 12 mins seat 9   Rows Red   3 x10   Ext RED   3  x10   H-ABD         Blank cell = exercise not performed today  Modalities  Date:  Hot Pack: Lumbar, 8 mins, Pain and Tone   PATIENT EDUCATION:  Education details: plan of care, prognosis, healing, anatomy, and goals for physical therapy Person educated: Patient Education method: Explanation Education comprehension: verbalized understanding  HOME EXERCISE PROGRAM:   ASSESSMENT:   CLINICAL IMPRESSION: Patient arrived today doing better overall, but with soreness. Rx focused on potural exs as well as STW and IFC end of session.Pt able to continue with red band resistance with mainly fatigue and not increased pain   OBJECTIVE IMPAIRMENTS: decreased activity tolerance, decreased ROM, decreased strength, hypomobility, impaired tone, postural dysfunction, and pain.   ACTIVITY LIMITATIONS: carrying, lifting, and sleeping  PARTICIPATION LIMITATIONS: cleaning and shopping  PERSONAL FACTORS: Past/current experiences, Time since onset of injury/illness/exacerbation, and 3+ comorbidities: Atrial fibrillation,  osteoporosis, allergies, hypertension, osteoarthritis, and history of thyroid  cancer  are also affecting patient's functional outcome.   REHAB POTENTIAL: Good  CLINICAL DECISION MAKING: Evolving/moderate complexity  EVALUATION COMPLEXITY: Moderate   GOALS: Goals reviewed with patient? Yes  LONG TERM GOALS: Target date: 12/23/23  Patient will be independent with her HEP. Baseline:  Goal status:  MET  2.  Patient will be able to complete her daily activities without her familiar symptoms exceeding 6/10. Baseline:  Goal status: On going  3.  Patient will report being able to sit for at least 45 minutes without being limited by her familiar symptoms to return to attending her regular church services. Baseline:  Goal status: On going  4.  Patient will report being able to sleep without being awakened by her familiar symptoms. Baseline:  Goal status: On going  5.  Patient will improve her left hip flexor strength to at least 4-/5 for improved functional mobility. Baseline:  Goal status: On going  PLAN:  PT FREQUENCY: 2x/week  PT DURATION: 4 weeks  PLANNED INTERVENTIONS: 97164- PT Re-evaluation, 97110-Therapeutic exercises, 97530- Therapeutic activity, 97112- Neuromuscular re-education, 97535- Self Care, 16109- Manual therapy, 97014- Electrical stimulation (unattended), 97035- Ultrasound, Patient/Family education, Balance training, Dry Needling, Joint mobilization,  Spinal mobilization, Cryotherapy, and Moist heat.  PLAN FOR NEXT SESSION: NuStep, rows, postural reeducation, manual therapy, and modalities as needed  Ashleyanne Hemmingway,CHRIS, PTA 12/16/2023, 3:52 PM

## 2023-12-19 ENCOUNTER — Encounter: Payer: Self-pay | Admitting: *Deleted

## 2023-12-19 ENCOUNTER — Ambulatory Visit: Payer: PPO | Admitting: *Deleted

## 2023-12-19 DIAGNOSIS — M546 Pain in thoracic spine: Secondary | ICD-10-CM

## 2023-12-19 DIAGNOSIS — M6281 Muscle weakness (generalized): Secondary | ICD-10-CM

## 2023-12-19 NOTE — Therapy (Signed)
OUTPATIENT PHYSICAL THERAPY THORACOLUMBAR TREATMENT   Patient Name: [name] MRN: [id number] DOB:11-04-1946, 78 y.o., female Today's Date: 12/19/2023  END OF SESSION:  PT End of Session - 12/19/23 1348     Visit Number 8    Number of Visits 8    Date for PT Re-Evaluation 01/31/24    PT Start Time 1345    PT Stop Time 1434    PT Time Calculation (min) 49 min              Past Medical History:  Diagnosis Date   Allergy    Atrial fibrillation (HCC)    PT ON FLECANIDE -DID NOT WANT TO BE ON BLOOD THINNERS OTHER THAN DAILY ASA- due to adrenal gland issue - no issues since corrected    Blood transfusion without reported diagnosis    Blurry vision, right eye    HX OF RT EYE VISION DISTURBANCE 01/2012 -PT SEEN BY DR. RANKIN--ELEVATED B/P  THOUGHT TO BE RELATED TO [place] SPOTTED FEVER THOUGHT TO BE CAUSE OF VISION PROBLEM-HAD LASER OF BOTH EYES (SLIGHT DAMAGE TO LEFT  EYE).     Cataract    BILATERAL removed    GERD (gastroesophageal reflux disease)    Headache(784.0)    PAST HX MIGRAINES   Hearing loss of right ear    Heart murmur    TR    Hypertension    B/P HAS BEEN UP AND DOWN BUT MOSTLY DOWN WHILE ON METOPROLOL -WAS TAKEN OFF METOROLOL IN NOV 2012 BY CARDIOLOGIST AND THEN IN MARCH 2013 PT'S B/P BECAME REALLY ELEVATED-SHE RESTARTED METOPROLOL  AND WAS GIVEN BENICAR.  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 hx of mets to luns, which was treated  (unsure of how)   TMJ locking    PT STATES IF MOUTH OPEN EXTENDED TIME-LIKE AT DENTIST--HER JAWS LOCK   TR (tricuspid regurgitation)    Vegetarian diet    DOES EAT EGGS AND DAIRY--NO MEATS OR FISH   Past Surgical History:  Procedure Laterality Date   ADRENALECTOMY  07/03/2012   Procedure: ADRENALECTOMY;  Surgeon: Crecencio Mc, MD;  Location: [facility];  Service: Urology;  Laterality: Right;   APPENDECTOMY     Lung biopsies     THYROIDECTOMY     UPPER GASTROINTESTINAL ENDOSCOPY     WRIST SURGERY     Patient Active Problem List   Diagnosis Date Noted   Neck pain, acute 05/30/2023   Early stage nonexudative age-related macular degeneration of both eyes 04/12/2022   Retinal vasculitis of left eye 04/06/2021   LBBB (left bundle branch block) 08/29/2020   Non-rheumatic mitral regurgitation 08/29/2020   Central retinal vein occlusion of right eye 03/21/2020   Non-rheumatic tricuspid valve insufficiency 03/06/2017   Personal history of pheochromocytoma 02/17/2014   Vitamin D deficiency 10/21/2013   Benign tumor of adrenal gland 10/21/2013   Hypothyroidism 10/21/2013  History of thyroid cancer 06/18/2013   Sinus tachycardia 07/06/2012   Atrial fibrillation (HCC) 04/04/2010   Mitral valve disorder 03/28/2010   Osteoporosis, post-menopausal 03/28/2010   REFERRING PROVIDER: Gabriel Earing, FNP   REFERRING DIAG: Chronic bilateral thoracic back pain   Rationale for Evaluation and Treatment: Rehabilitation  THERAPY DIAG:  Pain in thoracic spine  Muscle weakness (generalized)  ONSET DATE: 07/30/23  SUBJECTIVE:                                                                                                                                                                                           SUBJECTIVE STATEMENT: Patient reports that she feels 50-60% better since starting PT  PERTINENT HISTORY:  Atrial fibrillation, osteoporosis, allergies, hypertension, osteoarthritis, and  history of thyroid cancer  PAIN:  Are you having pain? Yes: NPRS scale: 1-2/10 Pain location: Thoracic spine (R>L) Pain description: sore Aggravating factors: bending over, vacuuming, sweeping, pulling Relieving factors: sitting and medication  PRECAUTIONS: Fall  RED FLAGS: None   WEIGHT BEARING RESTRICTIONS: No  FALLS:  Has patient fallen in last 6 months? Yes. Number of falls 1  LIVING ENVIRONMENT: Lives with: lives with their spouse Lives in: House/apartment Has following equipment at home: None  OCCUPATION: retired  PLOF: Independent  PATIENT GOALS: reduced pain, be able to do most activities around the house, improved sleep (prefers to lay supine, but cannot due to pain), and improved mobility  NEXT MD VISIT: 01/15/24  OBJECTIVE:  Note: Objective measures were completed at Evaluation unless otherwise noted.  DIAGNOSTIC FINDINGS: 10/22/23 rib x-ray IMPRESSION: Negative.  PATIENT SURVEYS:  FOTO 53.57  COGNITION: Overall cognitive status: Within functional limits for tasks assessed     SENSATION: Patient reports no numbness or tingling  POSTURE: rounded shoulders, forward head, decreased lumbar lordosis, and increased thoracic kyphosis  PALPATION: TTP: right thoracic paraspinals and T11 spinous process  LUMBAR ROM:   AROM eval  Flexion 70  Extension 22  Right lateral flexion 25% limited  Left lateral flexion 50% limited; familiar pain  Right rotation 25% limited  Left rotation No limitation   (Blank rows = not tested)  LOWER EXTREMITY ROM: WFL for activities assessed   LOWER EXTREMITY MMT:    MMT Right eval Left eval  Hip flexion 4-/5 3/5  Hip extension    Hip abduction    Hip adduction    Hip internal rotation    Hip external rotation    Knee flexion 4+/5 4+/5  Knee extension 4+/5 5/5  Ankle dorsiflexion 4-/5 4-/5  Ankle plantarflexion    Ankle inversion    Ankle eversion     (  Blank rows = not tested)  GAIT: Assistive device  utilized: None Level of assistance: Complete Independence Comments: no significant gait deviations observed  TREATMENT DATE:                                            12/19/23 EXERCISE LOG  Exercise Repetitions and Resistance Comments  Nustep   BUE and BLE   UBE X 6 mins 120 RPM   Resisted row  Red t-band 3 x12 reps each    Resisted pull down  Red t-band  3x12 reps each    Resisted multifidus rotation  Red t-band   2x10 reps each    Manual therapy     Habd  Red tband x12, x 10    Blank cell = exercise not performed today  Manual Therapy Soft Tissue Mobilization: thoracic paraspinals, for reduced pain and tone Joint Mobilizations:  ,     IFC x 15 mins 80-150hz  to thoracic paras seated   12/09/23  EXERCISE LOG   Mid-back  Exercise Repetitions and Resistance Comments  Nustep  L4 x 12 mins seat 9   Rows Red   3 x10   Ext RED   3  x10   H-ABD         Blank cell = exercise not performed today  Modalities  Date:  Hot Pack: Lumbar, 8 mins, Pain and Tone   PATIENT EDUCATION:  Education details: plan of care, prognosis, healing, anatomy, and goals for physical therapy Person educated: Patient Education method: Explanation Education comprehension: verbalized understanding  HOME EXERCISE PROGRAM:   ASSESSMENT:   CLINICAL IMPRESSION: Patient arrived today still doing good and feels 50-60% better since starting PT. Rx focused on posture exs for HEP, STW and IFC. All LTGs MET. DC to HEP   OBJECTIVE IMPAIRMENTS: decreased activity tolerance, decreased ROM, decreased strength, hypomobility, impaired tone, postural dysfunction, and pain.   ACTIVITY LIMITATIONS: carrying, lifting, and sleeping  PARTICIPATION LIMITATIONS: cleaning and shopping  PERSONAL FACTORS: Past/current experiences, Time since onset of injury/illness/exacerbation, and 3+ comorbidities: Atrial fibrillation, osteoporosis, allergies, hypertension, osteoarthritis, and history of thyroid cancer  are also affecting  patient's functional outcome.   REHAB POTENTIAL: Good  CLINICAL DECISION MAKING: Evolving/moderate complexity  EVALUATION COMPLEXITY: Moderate   GOALS: Goals reviewed with patient? Yes  LONG TERM GOALS: Target date: 12/23/23  Patient will be independent with her HEP. Baseline:  Goal status:  MET  2.  Patient will be able to complete her daily activities without her familiar symptoms exceeding 6/10. Baseline:  Goal status: MET  3.  Patient will report being able to sit for at least 45 minutes without being limited by her familiar symptoms to return to attending her regular church services. Baseline:  Goal status: MET  4.  Patient will report being able to sleep without being awakened by her familiar symptoms. Baseline:  Goal status: MET  5.  Patient will improve her left hip flexor strength to at least 4-/5 for improved functional mobility. Baseline:  Goal status: MET      PLAN:  PT FREQUENCY: 2x/week  PT DURATION: 4 weeks  PLANNED INTERVENTIONS: 97164- PT Re-evaluation, 97110-Therapeutic exercises, 97530- Therapeutic activity, 97112- Neuromuscular re-education, 97535- Self Care, 16109- Manual therapy, 97014- Electrical stimulation (unattended), 97035- Ultrasound, Patient/Family education, Balance training, Dry Needling, Joint mobilization, Spinal mobilization, Cryotherapy, and Moist heat.  PLAN FOR NEXT SESSION: DC  to HEP  Ellenie Salome,CHRIS, PTA 12/19/2023, 2:58 PM

## 2024-01-15 ENCOUNTER — Ambulatory Visit: Payer: PPO | Admitting: Family Medicine

## 2024-01-15 ENCOUNTER — Encounter: Payer: Self-pay | Admitting: Family Medicine

## 2024-01-15 VITALS — BP 106/61 | HR 74 | Ht 66.0 in | Wt 135.0 lb

## 2024-01-15 DIAGNOSIS — E559 Vitamin D deficiency, unspecified: Secondary | ICD-10-CM

## 2024-01-15 DIAGNOSIS — E039 Hypothyroidism, unspecified: Secondary | ICD-10-CM | POA: Diagnosis not present

## 2024-01-15 DIAGNOSIS — I480 Paroxysmal atrial fibrillation: Secondary | ICD-10-CM

## 2024-01-15 DIAGNOSIS — Z85850 Personal history of malignant neoplasm of thyroid: Secondary | ICD-10-CM | POA: Diagnosis not present

## 2024-01-15 LAB — LIPID PANEL

## 2024-01-15 NOTE — Progress Notes (Signed)
 BP 106/61   Pulse 74   Ht 5\' 6"  (1.676 m)   Wt 135 lb (61.2 kg)   SpO2 94%   BMI 21.79 kg/m    Subjective:   Patient ID: [name], female    DOB: 12/02/1945, 78 y.o.   MRN: [id number]  HPI: SHRILEY JOFFE is a 78 y.o. female presenting on 01/15/2024 for Medical Management of Chronic Issues and Hypothyroidism   HPI Hypothyroidism recheck with history of thyroid cancer Patient is coming in for thyroid recheck today as well. They deny any issues with hair changes or heat or cold problems or diarrhea or constipation. They deny any chest pain or palpitations. They are currently on levothyroxine 137 micrograms   A-fib Patient is currently on flecainide and aspirin, and their blood pressure today is 106/61 and heart rate 74. Patient denies any lightheadedness or dizziness. Patient denies headaches, blurred vision, chest pains, shortness of breath, or weakness. Denies any side effects from medication and is content with current medication.   Patient has been monitoring her adrenal glands history of hormonal issues.  She also has a history of pheochromocytoma with adrenalectomy  Relevant past medical, surgical, family and social history reviewed and updated as indicated. Interim medical history since our last visit reviewed. Allergies and medications reviewed and updated.  Review of Systems  Constitutional:  Negative for chills and fever.  Eyes:  Negative for visual disturbance.  Respiratory:  Negative for chest tightness and shortness of breath.   Cardiovascular:  Negative for chest pain and leg swelling.  Genitourinary:  Negative for difficulty urinating and dysuria.  Musculoskeletal:  Negative for back pain and gait problem.  Skin:  Negative for rash.  Neurological:  Negative for dizziness, light-headedness and headaches.  Psychiatric/Behavioral:  Negative for agitation and behavioral problems.   All other systems reviewed and are negative.   Per HPI unless specifically  indicated above   Allergies as of 01/15/2024       Reactions   Penicillins Anaphylaxis   Actonel [risedronate Sodium]    Nausea and worsened heart burn   Ceclor [cefaclor] Hives   Acetaminophen Nausea And Vomiting, Other (See Comments)   Headache   Alendronate Sodium Nausea Only, Other (See Comments)   Heartburn, stomach upset   Celecoxib Other (See Comments)   Stomach upset, heart burn   Codeine Nausea And Vomiting, Other (See Comments)   Headache   Ventolin [kdc:albuterol] Palpitations   Rapid heart beat        Medication List        Accurate as of January 15, 2024  9:03 AM. If you have any questions, ask your nurse or doctor.          aspirin EC 325 MG tablet Take 325 mg by mouth daily.   carboxymethylcellul-glycerin 0.5-0.9 % ophthalmic solution Commonly known as: REFRESH OPTIVE Place 1 drop into both eyes in the morning and at bedtime.   Centrum Silver tablet Take 1 tablet by mouth every morning.   PRESERVISION/LUTEIN PO Take 1 capsule by mouth daily.   Fish Oil 1200 MG Caps Take 1 capsule by mouth daily.   flecainide 50 MG tablet Commonly known as: TAMBOCOR Take 1 tablet (50 mg total) by mouth 2 (two) times daily.   levothyroxine 137 MCG tablet Commonly known as: Synthroid TAKE 1 TABLET BY MOUTH DAILY BEFORE BREAKFAST.   loratadine 10 MG tablet Commonly known as: CLARITIN Take 10 mg by mouth daily as needed for allergies.   Refresh  Lacri-Lube Oint Apply 1 inch to eye at bedtime. Apply to bottom lid   Vitamin D3 50 MCG (2000 UT) Tabs Take 1 capsule by mouth daily.         Objective:   BP 106/61   Pulse 74   Ht 5\' 6"  (1.676 m)   Wt 135 lb (61.2 kg)   SpO2 94%   BMI 21.79 kg/m   Wt Readings from Last 3 Encounters:  01/15/24 135 lb (61.2 kg)  11/15/23 140 lb (63.5 kg)  11/01/23 140 lb (63.5 kg)    Physical Exam Vitals and nursing note reviewed.  Constitutional:      General: She is not in acute distress.    Appearance: She is  well-developed. She is not diaphoretic.  Eyes:     Conjunctiva/sclera: Conjunctivae normal.  Cardiovascular:     Rate and Rhythm: Normal rate and regular rhythm.     Heart sounds: Normal heart sounds. No murmur heard. Pulmonary:     Effort: Pulmonary effort is normal. No respiratory distress.     Breath sounds: Normal breath sounds. No wheezing.  Musculoskeletal:        General: No tenderness. Normal range of motion.  Skin:    General: Skin is warm and dry.     Findings: No rash.  Neurological:     Mental Status: She is alert and oriented to person, place, and time.     Coordination: Coordination normal.  Psychiatric:        Behavior: Behavior normal.       Assessment & Plan:   Problem List Items Addressed This Visit       Cardiovascular and Mediastinum   Atrial fibrillation (HCC)   Relevant Orders   Cortisol-am, blood     Endocrine   Hypothyroidism - Primary   Relevant Orders   CBC with Differential/Platelet   CMP14+EGFR   Lipid panel   TSH   Cortisol-am, blood     Other   Vitamin D deficiency   Relevant Orders   VITAMIN D 25 Hydroxy (Vit-D Deficiency, Fractures)   History of thyroid cancer   Relevant Orders   Cortisol-am, blood    No changes to medication, patient seems to be doing well.  Sees cardiology for A-fib and hypertension as well.  Will check blood work today Follow up plan: Return in about 6 months (around 07/17/2024), or if symptoms worsen or fail to improve, for Thyroid and A-fib recheck.  Counseling provided for all of the vaccine components Orders Placed This Encounter  Procedures   CBC with Differential/Platelet   CMP14+EGFR   Lipid panel   TSH   VITAMIN D 25 Hydroxy (Vit-D Deficiency, Fractures)   Cortisol-am, blood    Arville Care, MD [facility] 01/15/2024, 9:03 AM

## 2024-01-16 ENCOUNTER — Encounter: Payer: Self-pay | Admitting: Family Medicine

## 2024-01-16 LAB — CBC WITH DIFFERENTIAL/PLATELET
Basophils Absolute: 0 10*3/uL (ref 0.0–0.2)
Basos: 0 %
EOS (ABSOLUTE): 0.3 10*3/uL (ref 0.0–0.4)
Eos: 5 %
Hematocrit: 39 % (ref 34.0–46.6)
Hemoglobin: 13 g/dL (ref 11.1–15.9)
Immature Grans (Abs): 0 10*3/uL (ref 0.0–0.1)
Immature Granulocytes: 0 %
Lymphocytes Absolute: 1.5 10*3/uL (ref 0.7–3.1)
Lymphs: 31 %
MCH: 29.7 pg (ref 26.6–33.0)
MCHC: 33.3 g/dL (ref 31.5–35.7)
MCV: 89 fL (ref 79–97)
Monocytes Absolute: 0.5 10*3/uL (ref 0.1–0.9)
Monocytes: 10 %
Neutrophils Absolute: 2.7 10*3/uL (ref 1.4–7.0)
Neutrophils: 54 %
Platelets: 178 10*3/uL (ref 150–450)
RBC: 4.37 x10E6/uL (ref 3.77–5.28)
RDW: 12.6 % (ref 11.7–15.4)
WBC: 5 10*3/uL (ref 3.4–10.8)

## 2024-01-16 LAB — CMP14+EGFR
ALT: 17 IU/L (ref 0–32)
AST: 24 IU/L (ref 0–40)
Albumin: 4.1 g/dL (ref 3.8–4.8)
Alkaline Phosphatase: 98 IU/L (ref 44–121)
BUN/Creatinine Ratio: 23 (ref 12–28)
BUN: 15 mg/dL (ref 8–27)
Bilirubin Total: 0.8 mg/dL (ref 0.0–1.2)
CO2: 26 mmol/L (ref 20–29)
Calcium: 9.6 mg/dL (ref 8.7–10.3)
Chloride: 102 mmol/L (ref 96–106)
Creatinine, Ser: 0.64 mg/dL (ref 0.57–1.00)
Globulin, Total: 2.9 g/dL (ref 1.5–4.5)
Glucose: 88 mg/dL (ref 70–99)
Potassium: 4.6 mmol/L (ref 3.5–5.2)
Sodium: 141 mmol/L (ref 134–144)
Total Protein: 7 g/dL (ref 6.0–8.5)
eGFR: 91 mL/min/{1.73_m2} (ref >59)

## 2024-01-16 LAB — TSH: TSH: 0.01 u[IU]/mL — ABNORMAL LOW (ref 0.450–4.500)

## 2024-01-16 LAB — LIPID PANEL
Cholesterol, Total: 163 mg/dL (ref 100–199)
HDL: 97 mg/dL (ref >39)
LDL CALC COMMENT:: 1.7 ratio (ref 0.0–4.4)
LDL Chol Calc (NIH): 57 mg/dL (ref 0–99)
Triglycerides: 38 mg/dL (ref 0–149)
VLDL Cholesterol Cal: 9 mg/dL (ref 5–40)

## 2024-01-16 LAB — CORTISOL-AM, BLOOD: Cortisol - AM: 11.8 ug/dL (ref 6.2–19.4)

## 2024-01-16 LAB — VITAMIN D 25 HYDROXY (VIT D DEFICIENCY, FRACTURES): Vit D, 25-Hydroxy: 54.2 ng/mL (ref 30.0–100.0)

## 2024-02-25 ENCOUNTER — Ambulatory Visit (INDEPENDENT_AMBULATORY_CARE_PROVIDER_SITE_OTHER): Payer: PPO

## 2024-02-25 VITALS — BP 106/61 | HR 74 | Ht 66.0 in | Wt 135.0 lb

## 2024-02-25 DIAGNOSIS — Z1211 Encounter for screening for malignant neoplasm of colon: Secondary | ICD-10-CM

## 2024-02-25 DIAGNOSIS — Z0000 Encounter for general adult medical examination without abnormal findings: Secondary | ICD-10-CM

## 2024-02-25 NOTE — Patient Instructions (Signed)
 [name] , Thank you for taking time to come for your Medicare Wellness Visit. I appreciate your ongoing commitment to your health goals. Please review the following plan we discussed and let me know if I can assist you in the future.   Referrals/Orders/Follow-Ups/Clinician Recommendations: Please remember to schedule your colonoscopy appointment which is due this June. Thank your for your time this morning for AWV appointment with me.   This is a list of the screening recommended for you and due dates:  Health Maintenance  Topic Date Due   Colon Cancer Screening  04/26/2024   Pap Smear  01/14/2025*   COVID-19 Vaccine (4 - 2024-25 season) 03/12/2025*   Flu Shot  06/05/2024   Mammogram  09/22/2024   DEXA scan (bone density measurement)  01/22/2025   Medicare Annual Wellness Visit  02/24/2025   DTaP/Tdap/Td vaccine (3 - Td or Tdap) 01/12/2027   Pneumonia Vaccine  Completed   Hepatitis C Screening  Completed   Zoster (Shingles) Vaccine  Completed   HPV Vaccine  Aged Out   Meningitis B Vaccine  Aged Out   Cologuard (Stool DNA test)  Discontinued  *Topic was postponed. The date shown is not the original due date.    Advanced directives: (Declined) Advance directive discussed with you today. Even though you declined this today, please call our office should you change your mind, and we can give you the proper paperwork for you to fill out.  Next Medicare Annual Wellness Visit scheduled for next year: Yes

## 2024-02-25 NOTE — Progress Notes (Signed)
 Subjective:   [name] is a 78 y.o. who presents for a Medicare Wellness preventive visit.  Visit Complete: Virtual I connected with  [name] on 02/25/24 by a audio enabled telemedicine application and verified that I am speaking with the correct person using two identifiers.  Patient Location: Home  Provider Location: Home Office  I discussed the limitations of evaluation and management by telemedicine. The patient expressed understanding and agreed to proceed.  Vital Signs: Because this visit was a virtual/telehealth visit, some criteria may be missing or patient reported. Any vitals not documented were not able to be obtained and vitals that have been documented are patient reported.  VideoDeclined- This patient declined [name]. Therefore the visit was completed with audio only.  Persons Participating in Visit: Patient.  AWV Questionnaire: No: Patient Medicare AWV questionnaire was not completed prior to this visit.  Cardiac Risk Factors include: advanced age (>65men, >57 women) (Mitral valve disorder  Atrial fibrillation (HCC),  LBBB (left bundle branch block))     Objective:    Today's Vitals   02/25/24 1140  BP: 106/61  Pulse: 74  Weight: 135 lb (61.2 kg)  Height: 5\' 6"  (1.676 m)   Body mass index is 21.79 kg/m.     02/25/2024   11:44 AM 11/25/2023   10:13 AM 02/21/2023    9:50 AM 07/20/2022    1:41 PM 07/19/2021    2:40 PM 02/03/2020   10:27 AM 07/04/2012    8:00 PM  Advanced Directives  Does Patient Have a Medical Advance Directive? No Yes No No No No Patient does not have advance directive;Patient would not like information  Would patient like information on creating a medical advance directive?   No - Patient declined No - Patient declined No - Patient declined No - Patient declined     Current Medications (verified) Outpatient Encounter Medications as of 02/25/2024  Medication Sig   Artificial Tear  Ointment (REFRESH LACRI-LUBE) OINT Apply 1 inch to eye at bedtime. Apply to bottom lid   aspirin 325 MG EC tablet Take 325 mg by mouth daily.   carboxymethylcellul-glycerin (REFRESH OPTIVE) 0.5-0.9 % ophthalmic solution Place 1 drop into both eyes in the morning and at bedtime.   Cholecalciferol (VITAMIN D3) 2000 UNITS TABS Take 1 capsule by mouth daily.   flecainide  (TAMBOCOR ) 50 MG tablet Take 1 tablet (50 mg total) by mouth 2 (two) times daily.   levothyroxine  (SYNTHROID ) 137 MCG tablet TAKE 1 TABLET BY MOUTH DAILY BEFORE BREAKFAST.   loratadine (CLARITIN) 10 MG tablet Take 10 mg by mouth daily as needed for allergies.    Multiple Vitamins-Minerals (CENTRUM SILVER) tablet Take 1 tablet by mouth every morning.   Multiple Vitamins-Minerals (PRESERVISION/LUTEIN PO) Take 1 capsule by mouth daily.   Omega-3 Fatty Acids (FISH OIL) 1200 MG CAPS Take 1 capsule by mouth daily.   No facility-administered encounter medications on file as of 02/25/2024.    Allergies (verified) Penicillins, Actonel [risedronate sodium], Ceclor [cefaclor], Acetaminophen , Alendronate sodium, Celecoxib, Codeine, and Ventolin [kdc:albuterol]   History: Past Medical History:  Diagnosis Date   Allergy    Atrial fibrillation (HCC)    PT ON FLECANIDE -DID NOT WANT TO BE ON BLOOD THINNERS OTHER THAN DAILY ASA- due to adrenal gland issue - no issues since corrected    Blood transfusion without reported diagnosis    Blurry vision, right eye    HX OF RT EYE VISION DISTURBANCE 01/2012 -PT SEEN BY DR.  RANKIN--ELEVATED B/P  THOUGHT TO BE RELATED TO [place] SPOTTED FEVER THOUGHT TO BE CAUSE OF VISION PROBLEM-HAD LASER OF BOTH EYES (SLIGHT DAMAGE TO LEFT  EYE).     Cataract    BILATERAL removed    GERD (gastroesophageal reflux disease)    Headache(784.0)    PAST HX MIGRAINES   Hearing loss of right ear    Heart murmur    TR    Hypertension    B/P HAS BEEN UP AND DOWN BUT MOSTLY DOWN WHILE ON METOPROLOL  -WAS TAKEN OFF  METOROLOL IN NOV 2012 BY CARDIOLOGIST AND THEN IN MARCH 2013 PT'S B/P BECAME REALLY ELEVATED-SHE RESTARTED METOPROLOL   AND WAS GIVEN BENICAR.  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 hx of mets to luns, which was treated (unsure of how)   TMJ locking    PT STATES IF MOUTH OPEN EXTENDED TIME-LIKE AT DENTIST--HER JAWS LOCK   TR (tricuspid regurgitation)    Vegetarian diet    DOES EAT EGGS AND DAIRY--NO MEATS OR FISH   Past Surgical History:  Procedure Laterality Date   ADRENALECTOMY  07/03/2012   Procedure: ADRENALECTOMY;  Surgeon: Kristeen Peto, MD;  Location: [facility];  Service: Urology;  Laterality: Right;   APPENDECTOMY     Lung biopsies     THYROIDECTOMY     UPPER GASTROINTESTINAL ENDOSCOPY     WRIST SURGERY     Family History  Problem Relation Age of Onset   Kidney disease Mother    Colon polyps Father    Cancer Sister        lymphoma   Colon cancer Neg Hx    Esophageal cancer Neg Hx    Rectal cancer Neg Hx    Stomach cancer Neg Hx    Social History   Socioeconomic History   Marital status: Married    Spouse name: Julietta Ogren   Number of children: 1   Years of education: Not on file   Highest education level: 12th grade  Occupational History   Occupation: retired  Tobacco Use   Smoking status: Never   Smokeless tobacco: Never   Tobacco comments:    no tobacco   Vaping Use   Vaping status: Never Used  Substance and Sexual Activity   Alcohol  use: No   Drug  use: No   Sexual activity: Not on file  Other Topics Concern   Not on file  Social History Narrative   Lives in [place] with husband. Used to work full time as a Conservator, museum/gallery, now works one day/week. No herbal meds. Vegetarian; no caffeine Does not exercise regularly but is very active and denies any exertional symptoms.    Daughter lives close by   Social Drivers of Health   Financial Resource Strain: Low Risk  (02/25/2024)   Overall Financial Resource Strain (CARDIA)    Difficulty of Paying Living Expenses: Not hard at  all  Food Insecurity: No Food Insecurity (02/25/2024)   Hunger Vital Sign    Worried About Running Out of Food in the Last Year: Never true    Ran Out of Food in the Last Year: Never true  Transportation Needs: No Transportation Needs (02/25/2024)   PRAPARE - Administrator, Civil Service (Medical): No    Lack of Transportation (Non-Medical): No  Physical Activity: Insufficiently Active (02/25/2024)   Exercise Vital Sign    Days of Exercise per Week: 7 days    Minutes of Exercise per Session: 20 min  Stress: No Stress Concern Present (02/25/2024)   [employer] of Occupational Health - Occupational Stress Questionnaire    Feeling of Stress : Not at all  Social Connections: Socially Integrated (02/25/2024)   Social Connection and Isolation Panel [NHANES]    Frequency of Communication with Friends and Family: More than three times a week    Frequency of Social Gatherings with Friends and Family: More than three times a week    Attends Religious Services: More than 4 times per year    Active Member of [employer] or Organizations: No    Attends Engineer, structural: More than 4 times per year    Marital Status: Married    Tobacco Counseling Counseling given: Yes Tobacco comments: no tobacco     Clinical Intake:  Pre-visit preparation completed: Yes  Pain : No/denies pain     BMI - recorded: 21.79 Nutritional Status: BMI of 19-24   Normal Nutritional Risks: None Diabetes: No  No results found for: "HGBA1C"   How often do you need to have someone help you when you read instructions, pamphlets, or other written materials from your doctor or pharmacy?: 1 - Never  Interpreter Needed?: No  Information entered by :: Alia T/cma   Activities of Daily Living     02/25/2024   11:28 AM  In your present state of health, do you have any difficulty performing the following activities:  Hearing? 0  Vision? 0  Difficulty concentrating or making decisions? 0  Walking or climbing stairs? 0  Dressing or bathing? 0  Doing errands, shopping? 0  Preparing Food and eating ? N  Using the Toilet? N  In the past six months, have you accidently leaked urine? N  Do you have problems with loss of bowel control? N  Managing your Medications? N  Managing your Finances? N  Housekeeping or managing your Housekeeping? N    Patient Care Team: Dettinger, Lucio Sabin, MD as PCP - General (Family Medicine) Eilleen Grates, MD (Cardiology) Seward Dao Alleen Arbour, MD (Ophthalmology) Maris Sickle, MD (Ophthalmology)  Indicate any recent Medical Services you may have received from other than [facility] providers in the past year (date may be approximate).     Assessment:   This is a routine wellness examination for [place].  Hearing/Vision screen Hearing Screening - Comments:: Pt stated r-hearing issues Vision Screening - Comments:: Pt denies vision dif Pt goes to Dr. Candi Chafe in [place]   Goals Addressed             This Visit's Progress    Exercise 150 minutes per week (moderate activity)   On track    Hope to stay as healthy and independent as you are now       Depression Screen     02/25/2024   11:45 AM 01/15/2024    8:34 AM 11/15/2023   12:28 PM 11/01/2023    3:48 PM 10/22/2023  10:40 AM 07/17/2023    8:26 AM 05/30/2023    8:31 AM  PHQ 2/9 Scores  PHQ - 2 Score 0 0 0 0 0 0 0  PHQ- 9 Score   0  0 0 0    Fall Risk      02/25/2024   11:26 AM 01/15/2024    8:34 AM 11/15/2023   12:28 PM 11/01/2023    3:47 PM 10/22/2023   10:40 AM  Fall Risk   Falls in the past year? 1 0 0 1 1  Number falls in past yr: 0   0 0  Injury with Fall? 1   1 1   Risk for fall due to : Impaired balance/gait;Impaired mobility   History of fall(s) History of fall(s)  Follow up Education provided;Falls evaluation completed;Falls prevention discussed   Education provided Education provided    MEDICARE RISK AT HOME:  Medicare Risk at Home Any stairs in or around the home?: Yes If so, are there any without handrails?: Yes Home free of loose throw rugs in walkways, pet beds, electrical cords, etc?: Yes Adequate lighting in your home to reduce risk of falls?: Yes Life alert?: No Use of a cane, Denne or w/c?: No Grab bars in the bathroom?: No Shower chair or bench in shower?: No Elevated toilet seat or a handicapped toilet?: No  TIMED UP AND GO:  Was the test performed?  No Cognitive Function: 6CIT completed    08/22/2017    2:45 PM  MMSE - Mini Mental State Exam  Orientation to time 5  Orientation to Place 5  Registration 3  Attention/ Calculation 5  Recall 3  Language- name 2 objects 2  Language- repeat 1  Language- follow 3 step command 3  Language- read & follow direction 1  Write a sentence 1  Copy design 0  Total score 29        02/25/2024   11:33 AM 02/21/2023    9:50 AM 07/20/2022    1:42 PM 02/03/2020   10:19 AM  6CIT Screen  What Year? 0 points 0 points 0 points 0 points  What month? 0 points 0 points 0 points 0 points  What time? 0 points 0 points 0 points 0 points  Count back from 20 0 points 0 points 0 points 0 points  Months in reverse 0 points 0 points 0 points 0 points  Repeat phrase 0 points 0 points 0 points 0 points  Total Score 0 points 0 points 0 points 0 points    Immunizations Immunization History  Administered Date(s) Administered   Fluad Quad(high Dose 65+) 08/20/2019, 08/30/2020,  08/30/2021, 08/31/2022   Fluad Trivalent(High Dose 65+) 08/14/2023   Influenza Whole 08/05/2010   Influenza, High Dose Seasonal PF 08/13/2016, 08/26/2017, 09/01/2018   Influenza,inj,Quad PF,6+ Mos 08/19/2013, 08/19/2014, 08/11/2015   Moderna Sars-Covid-2 Vaccination 12/17/2019, 01/15/2020, 10/03/2020   Pneumococcal Conjugate-13 10/21/2013   Pneumococcal Polysaccharide-23 11/05/1994, 08/27/2011   Td 11/05/2006   Tdap 01/11/2017   Zoster Recombinant(Shingrix ) 01/15/2022, 07/16/2022    Screening Tests Health Maintenance  Topic Date Due   Colonoscopy  04/26/2024   Cervical Cancer Screening (Pap smear)  01/14/2025 (Originally 08/29/2022)   COVID-19 Vaccine (4 - 2024-25 season) 03/12/2025 (Originally 07/07/2023)   INFLUENZA VACCINE  06/05/2024   MAMMOGRAM  09/22/2024   DEXA SCAN  01/22/2025   Medicare Annual Wellness (AWV)  02/24/2025   DTaP/Tdap/Td (3 - Td or Tdap) 01/12/2027   Pneumonia Vaccine 70+ Years old  Completed   Hepatitis  C Screening  Completed   Zoster Vaccines- Shingrix   Completed   HPV VACCINES  Aged Out   Meningococcal B Vaccine  Aged Out   Fecal DNA (Cologuard)  Discontinued    Health Maintenance  Health Maintenance Due  Topic Date Due   Colonoscopy  04/26/2024   Health Maintenance Items Addressed: See Nurse Notes  Additional Screening:  Vision Screening: Recommended annual ophthalmology exams for early detection of glaucoma and other disorders of the eye.  Dental Screening: Recommended annual dental exams for proper oral hygiene  Community Resource Referral / Chronic Care Management: CRR required this visit?  No   CCM required this visit?  No     Plan:     I have personally reviewed and noted the following in the patient's chart:   Medical and social history Use of alcohol , tobacco or illicit drugs  Current medications and supplements including opioid prescriptions. Patient is not currently taking opioid prescriptions. Functional ability and  status Nutritional status Physical activity Advanced directives List of other physicians Hospitalizations, surgeries, and ER visits in previous 12 months Vitals Screenings to include cognitive, depression, and falls Referrals and appointments  In addition, I have reviewed and discussed with patient certain preventive protocols, quality metrics, and best practice recommendations. A written personalized care plan for preventive services as well as general preventive health recommendations were provided to patient.     Michaelle Adolphus, CMA   02/25/2024   After Visit Summary: (MyChart) Due to this being a telephonic visit, the after visit summary with patients personalized plan was offered to patient via MyChart   Notes:  RefGasto for colonoscopy

## 2024-03-11 ENCOUNTER — Encounter: Payer: Self-pay | Admitting: Gastroenterology

## 2024-04-14 ENCOUNTER — Ambulatory Visit (AMBULATORY_SURGERY_CENTER)

## 2024-04-14 VITALS — Ht 66.0 in | Wt 134.0 lb

## 2024-04-14 DIAGNOSIS — Z860100 Personal history of colon polyps, unspecified: Secondary | ICD-10-CM

## 2024-04-14 MED ORDER — NA SULFATE-K SULFATE-MG SULF 17.5-3.13-1.6 GM/177ML PO SOLN: 1.0000 | Freq: Once | ORAL | 0 refills | Status: AC

## 2024-04-14 NOTE — Progress Notes (Signed)
 No egg or soy allergy known to patient  No issues known to pt with past sedation with any surgeries or procedures Patient denies ever being told they had issues or difficulty with intubation  No FH of Malignant Hyperthermia Pt is not on diet pills Pt is not on  home 02  Pt is not on blood thinners  Pt denies issues with constipation  No A fib or A flutter Have any cardiac testing pending-- no. 2 year follow up ECHO 09/2024 LOA: independent  Prep: suprep  Patient's chart reviewed by Rogena Class CNRA prior to previsit and patient appropriate for the [facility].  Previsit completed and red dot placed by patient's name on their procedure day (on provider's schedule).     PV completed with patient. Prep instructions sent via mychart and home address.

## 2024-04-20 ENCOUNTER — Encounter: Payer: Self-pay | Admitting: Gastroenterology

## 2024-04-22 DIAGNOSIS — H353131 Nonexudative age-related macular degeneration, bilateral, early dry stage: Secondary | ICD-10-CM | POA: Diagnosis not present

## 2024-04-22 DIAGNOSIS — H348112 Central retinal vein occlusion, right eye, stable: Secondary | ICD-10-CM | POA: Diagnosis not present

## 2024-04-22 DIAGNOSIS — H35062 Retinal vasculitis, left eye: Secondary | ICD-10-CM | POA: Diagnosis not present

## 2024-04-23 ENCOUNTER — Ambulatory Visit: Payer: PPO | Admitting: Nurse Practitioner

## 2024-05-07 ENCOUNTER — Ambulatory Visit: Admitting: Gastroenterology

## 2024-05-07 ENCOUNTER — Encounter: Payer: Self-pay | Admitting: Gastroenterology

## 2024-05-07 VITALS — BP 98/56 | HR 81 | Temp 97.3°F | Resp 19 | Ht 66.0 in | Wt 134.0 lb

## 2024-05-07 DIAGNOSIS — Z860100 Personal history of colon polyps, unspecified: Secondary | ICD-10-CM

## 2024-05-07 DIAGNOSIS — I4891 Unspecified atrial fibrillation: Secondary | ICD-10-CM | POA: Diagnosis not present

## 2024-05-07 DIAGNOSIS — Z860101 Personal history of adenomatous and serrated colon polyps: Secondary | ICD-10-CM | POA: Diagnosis not present

## 2024-05-07 DIAGNOSIS — K5140 Inflammatory polyps of colon without complications: Secondary | ICD-10-CM

## 2024-05-07 DIAGNOSIS — D123 Benign neoplasm of transverse colon: Secondary | ICD-10-CM | POA: Diagnosis not present

## 2024-05-07 DIAGNOSIS — K648 Other hemorrhoids: Secondary | ICD-10-CM | POA: Diagnosis not present

## 2024-05-07 DIAGNOSIS — Z1211 Encounter for screening for malignant neoplasm of colon: Secondary | ICD-10-CM

## 2024-05-07 DIAGNOSIS — E039 Hypothyroidism, unspecified: Secondary | ICD-10-CM | POA: Diagnosis not present

## 2024-05-07 DIAGNOSIS — K644 Residual hemorrhoidal skin tags: Secondary | ICD-10-CM | POA: Diagnosis not present

## 2024-05-07 MED ORDER — SODIUM CHLORIDE 0.9 % IV SOLN: 500.0000 mL | Freq: Once | INTRAVENOUS | Status: DC

## 2024-05-07 NOTE — Progress Notes (Signed)
 Report given to PACU, vss

## 2024-05-07 NOTE — Patient Instructions (Addendum)
-   Resume previous diet. - Continue present medications. - Await pathology results. - No repeat colonoscopy due to age.   YOU HAD AN ENDOSCOPIC PROCEDURE TODAY AT THE [facility]:   Refer to the procedure report that was given to you for any specific questions about what was found during the examination.  If the procedure report does not answer your questions, please call your gastroenterologist to clarify.  If you requested that your care partner not be given the details of your procedure findings, then the procedure report has been included in a sealed envelope for you to review at your convenience later.  YOU SHOULD EXPECT: Some feelings of bloating in the abdomen. Passage of more gas than usual.  Walking can help get rid of the air that was put into your GI tract during the procedure and reduce the bloating. If you had a lower endoscopy (such as a colonoscopy or flexible sigmoidoscopy) you may notice spotting of blood in your stool or on the toilet paper. If you underwent a bowel prep for your procedure, you may not have a normal bowel movement for a few days.  Please Note:  You might notice some irritation and congestion in your nose or some drainage.  This is from the oxygen used during your procedure.  There is no need for concern and it should clear up in a day or so.  SYMPTOMS TO REPORT IMMEDIATELY:  Following lower endoscopy (colonoscopy or flexible sigmoidoscopy):  Excessive amounts of blood in the stool  Significant tenderness or worsening of abdominal pains  Swelling of the abdomen that is new, acute  Fever of 100F or higher  For urgent or emergent issues, a gastroenterologist can be reached at any hour by calling [phone]. Do not use MyChart messaging for urgent concerns.    DIET:  We do recommend a small meal at first, but then you may proceed to your regular diet.  Drink plenty of fluids but you should avoid alcoholic beverages for 24 hours.  ACTIVITY:   You should plan to take it easy for the rest of today and you should NOT DRIVE or use heavy machinery until tomorrow (because of the sedation medicines used during the test).    FOLLOW UP: Our staff will call the number listed on your records the next business day following your procedure.  We will call around 7:15- 8:00 am to check on you and address any questions or concerns that you may have regarding the information given to you following your procedure. If we do not reach you, we will leave a message.     If any biopsies were taken you will be contacted by phone or by letter within the next 1-3 weeks.  Please call us at [phone] if you have not heard about the biopsies in 3 weeks.    SIGNATURES/CONFIDENTIALITY: You and/or your care partner have signed paperwork which will be entered into your electronic medical record.  These signatures attest to the fact that that the information above on your After Visit Summary has been reviewed and is understood.  Full responsibility of the confidentiality of this discharge information lies with you and/or your care-partner.

## 2024-05-07 NOTE — Progress Notes (Signed)
 Pt's states no medical or surgical changes since previsit or office visit.

## 2024-05-07 NOTE — Progress Notes (Signed)
 [facility] History and Physical   Primary Care Physician:  Dettinger, Fonda LABOR, MD   Reason for Procedure:  History of adenomatous colon polyps  Plan:    Surveillance colonoscopy with possible interventions as needed     HPI: [name] is a very pleasant 78 y.o. female here for surveillance colonoscopy. Denies any nausea, vomiting, abdominal pain, melena or bright red blood per rectum  The risks and benefits as well as alternatives of endoscopic procedure(s) have been discussed and reviewed. All questions answered. The patient agrees to proceed.    Past Medical History:  Diagnosis Date   Allergy    Atrial fibrillation (HCC)    PT ON FLECANIDE -DID NOT WANT TO BE ON BLOOD THINNERS OTHER THAN DAILY ASA- due to adrenal gland issue - no issues since corrected    Blood transfusion without reported diagnosis    Blurry vision, right eye    HX OF RT EYE VISION DISTURBANCE 01/2012 -PT SEEN BY DR. RANKIN--ELEVATED B/P  THOUGHT TO BE RELATED TO [place] SPOTTED FEVER THOUGHT TO BE CAUSE OF VISION PROBLEM-HAD LASER OF BOTH EYES (SLIGHT DAMAGE TO LEFT  EYE).     Cataract    BILATERAL removed    GERD (gastroesophageal reflux disease)    Headache(784.0)    PAST HX MIGRAINES   Hearing loss of right ear    Heart murmur    TR    Hypertension    B/P HAS BEEN UP AND DOWN BUT MOSTLY DOWN WHILE ON METOPROLOL  -WAS TAKEN OFF METOROLOL IN NOV 2012 BY CARDIOLOGIST AND THEN IN MARCH 2013 PT'S B/P BECAME REALLY ELEVATED-SHE RESTARTED METOPROLOL   AND WAS GIVEN BENICAR.  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 hx of mets to luns, which was treated (unsure of how)   TMJ locking    PT STATES IF MOUTH OPEN EXTENDED TIME-LIKE AT DENTIST--HER JAWS LOCK   TR (tricuspid regurgitation)    Vegetarian diet    DOES EAT EGGS AND DAIRY--NO MEATS OR FISH    Past Surgical History:  Procedure Laterality Date   ADRENALECTOMY  07/03/2012   Procedure: ADRENALECTOMY;  Surgeon: Noretta Ferrara, MD;  Location: [facility];  Service: Urology;  Laterality: Right;   APPENDECTOMY     Lung biopsies     THYROIDECTOMY     UPPER GASTROINTESTINAL ENDOSCOPY     WRIST SURGERY      Prior to Admission medications   Medication Sig Start Date End Date Taking? Authorizing Provider  Artificial Tear Ointment (REFRESH LACRI-LUBE) OINT Apply 1 inch to eye at bedtime. Apply to bottom lid    [provider]  aspirin 325 MG EC tablet Take 325 mg by mouth daily.    [provider]  carboxymethylcellul-glycerin (REFRESH OPTIVE) 0.5-0.9 % ophthalmic solution Place 1 drop into both eyes in the morning and at bedtime.    [provider]  Cholecalciferol (VITAMIN D3) 2000 UNITS TABS Take 1 capsule by mouth daily.    [provider]  flecainide  (TAMBOCOR ) 50 MG tablet Take 1 tablet (50 mg total) by mouth 2 (two) times daily. 07/17/23   Dettinger, Fonda LABOR, MD  levothyroxine  (SYNTHROID ) 137 MCG tablet TAKE 1 TABLET BY MOUTH DAILY BEFORE BREAKFAST. 07/17/23   Dettinger, Fonda LABOR, MD  loratadine (CLARITIN) 10 MG tablet Take 10 mg by mouth daily as needed for allergies.     [provider]  Multiple Vitamins-Minerals (CENTRUM SILVER) tablet Take 1 tablet by mouth every morning.    [provider]  Multiple Vitamins-Minerals (PRESERVISION/LUTEIN PO) Take 1 capsule by mouth daily.    [provider]  Omega-3 Fatty Acids (FISH OIL) 1200 MG CAPS Take 1  capsule by mouth daily.    [provider]    Current Outpatient Medications  Medication Sig Dispense Refill   Artificial Tear Ointment (REFRESH LACRI-LUBE) OINT Apply 1 inch to eye at bedtime. Apply to bottom lid     aspirin 325 MG EC tablet Take 325 mg by mouth daily.     carboxymethylcellul-glycerin (REFRESH OPTIVE) 0.5-0.9 % ophthalmic solution Place 1 drop into both eyes in the morning and at bedtime.     Cholecalciferol (VITAMIN D3) 2000 UNITS TABS Take 1 capsule by mouth daily.     flecainide  (TAMBOCOR ) 50 MG tablet Take 1 tablet (50 mg total) by mouth 2 (two) times daily. 180 tablet 3   levothyroxine  (SYNTHROID ) 137 MCG tablet TAKE 1 TABLET BY MOUTH DAILY BEFORE BREAKFAST. 90 tablet 3   loratadine (CLARITIN) 10 MG tablet Take 10 mg by mouth daily as needed for allergies.      Multiple Vitamins-Minerals (CENTRUM SILVER) tablet Take 1 tablet by mouth every morning.     Multiple Vitamins-Minerals (PRESERVISION/LUTEIN PO) Take 1 capsule by mouth daily.     Omega-3 Fatty Acids (FISH OIL) 1200 MG CAPS Take 1 capsule by mouth daily.     Current Facility-Administered Medications  Medication Dose Route Frequency Provider Last Rate Last Admin   0.9 %  sodium chloride  infusion  500 mL Intravenous Once Lester Platas V, MD        Allergies as of 05/07/2024 - Review Complete 05/07/2024  Allergen Reaction Noted   Penicillins Anaphylaxis 04/04/2010   Actonel [risedronate sodium]  02/03/2014   Ceclor [cefaclor] Hives 02/16/2013   Acetaminophen  Nausea And Vomiting and Other (See Comments) 04/04/2010   Alendronate sodium Nausea Only and Other (See Comments) 02/02/2011   Celecoxib Other (See Comments) 04/04/2010   Codeine Nausea And Vomiting and Other (See Comments) 04/04/2010   Ventolin [kdc:albuterol] Palpitations 02/02/2011    Family History  Problem Relation Age of Onset   Kidney disease Mother    Colon polyps Father    Cancer Sister        lymphoma   Colon cancer Neg Hx     Esophageal cancer Neg Hx    Rectal cancer Neg Hx    Stomach cancer Neg Hx     Social History   Socioeconomic History   Marital status: Married    Spouse name: Gretel   Number of children: 1   Years of education: Not on file   Highest education level: 12th grade  Occupational History   Occupation: retired  Tobacco Use   Smoking status: Never   Smokeless tobacco: Never   Tobacco comments:    no tobacco   Vaping Use   Vaping status: Never Used  Substance and Sexual Activity   Alcohol  use: No   Drug use: No   Sexual activity: Not on file  Other Topics Concern   Not on file  Social History Narrative   Lives in [place] with husband. Used to work full time as a Conservator, museum/gallery, now works one day/week. No herbal meds. Vegetarian; no caffeine Does not exercise regularly but is very active and denies any exertional symptoms.    Daughter lives close by   Social Drivers of Health   Financial Resource Strain: Low Risk  (02/25/2024)   Overall Financial Resource Strain (CARDIA)    Difficulty of Paying Living Expenses: Not hard at all  Food Insecurity: No Food Insecurity (02/25/2024)   Hunger Vital Sign    Worried About Running Out of Food in the Last Year: Never true    Ran Out of Food in the Last Year: Never true  Transportation Needs: No Transportation Needs (02/25/2024)   PRAPARE - Administrator, Civil Service (Medical): No    Lack of Transportation (Non-Medical): No  Physical Activity: Insufficiently Active (02/25/2024)   Exercise Vital Sign    Days of Exercise per Week: 7 days    Minutes of Exercise per Session: 20 min  Stress: No Stress Concern Present (02/25/2024)   [employer] of Occupational Health - Occupational Stress Questionnaire    Feeling of Stress : Not at all  Social Connections: Socially Integrated (02/25/2024)   Social Connection and Isolation Panel    Frequency of Communication with Friends and Family: More than three times a week    Frequency  of Social Gatherings with Friends and Family: More than three times a week    Attends Religious Services: More than 4 times per year    Active Member of [employer] or Organizations: No    Attends Engineer, structural: More than 4 times per year    Marital Status: Married  Catering manager Violence: Not At Risk (02/25/2024)   Humiliation, Afraid, Rape, and Kick questionnaire    Fear of Current or Ex-Partner: No    Emotionally Abused: No    Physically Abused: No    Sexually Abused: No    Review of Systems:  All other review of systems negative except as mentioned in the HPI.  Physical Exam: Vital signs in last 24 hours: BP 115/60   Pulse 80   Temp (!) 97.3 F (36.3 C)   Ht 5' 6 (1.676 m)   Wt 134 lb (60.8 kg)   SpO2 97%   BMI 21.63 kg/m  General:   Alert, NAD Lungs:  Clear .   Heart:  Regular rate and rhythm Abdomen:  Soft, nontender and nondistended. Neuro/Psych:  Alert and cooperative. Normal mood and affect. A and O x 3  Reviewed labs, radiology imaging, old records and pertinent past GI work up  Patient is appropriate for planned procedure(s) and anesthesia in an ambulatory setting   K. Veena Tonga Prout , MD [phone]

## 2024-05-07 NOTE — Op Note (Signed)
 [facility] Patient Name: [name] Procedure Date: 05/07/2024 10:24 AM MRN: [id number] Endoscopist: Gustav ALONSO Mcgee , MD, [id number] Age: 78 Referring MD:  Date of Birth: 15-Jul-1946 Gender: Female Account #: [id number] Procedure:                Colonoscopy Indications:              High risk colon cancer surveillance: Personal                            history of colonic polyps, High risk colon cancer                            surveillance: Personal history of adenoma (10 mm or                            greater in size) Medicines:                Monitored Anesthesia Care Procedure:                Pre-Anesthesia Assessment:                           - Prior to the procedure, a History and Physical                            was performed, and patient medications and                            allergies were reviewed. The patient's tolerance of                            previous anesthesia was also reviewed. The risks                            and benefits of the procedure and the sedation                            options and risks were discussed with the patient.                            All questions were answered, and informed consent                            was obtained. Prior Anticoagulants: The patient has                            taken no anticoagulant or antiplatelet agents. ASA                            Grade Assessment: II - A patient with mild systemic                            disease. After reviewing the risks and benefits,  the patient was deemed in satisfactory condition to                            undergo the procedure.                           After obtaining informed consent, the colonoscope                            was passed under direct vision. Throughout the                            procedure, the patient's blood pressure, pulse, and                            oxygen saturations were monitored  continuously. The                            Olympus Scope SN [id number] was introduced through the                            anus and advanced to the the cecum, identified by                            appendiceal orifice and ileocecal valve. The                            colonoscopy was performed without difficulty. The                            patient tolerated the procedure well. The quality                            of the bowel preparation was adequate. The                            ileocecal valve, appendiceal orifice, and rectum                            were photographed. Scope In: 10:38:52 AM Scope Out: 10:51:13 AM Scope Withdrawal Time: 0 hours 7 minutes 14 seconds  Total Procedure Duration: 0 hours 12 minutes 21 seconds  Findings:                 The perianal and digital rectal examinations were                            normal.                           A 4 mm polyp was found in the transverse colon. The                            polyp was sessile. The polyp was removed with a  cold snare. Resection and retrieval were complete.                           Non-bleeding external and internal hemorrhoids were                            found during retroflexion. The hemorrhoids were                            small. Complications:            No immediate complications. Estimated Blood Loss:     Estimated blood loss was minimal. Impression:               - One 4 mm polyp in the transverse colon, removed                            with a cold snare. Resected and retrieved.                           - Non-bleeding external and internal hemorrhoids. Recommendation:           - Patient has a contact number available for                            emergencies. The signs and symptoms of potential                            delayed complications were discussed with the                            patient. Return to normal activities tomorrow.                             Written discharge instructions were provided to the                            patient.                           - Resume previous diet.                           - Continue present medications.                           - Await pathology results.                           - No repeat colonoscopy due to age. [name] V. Shelby Hinkle, MD 05/07/2024 11:05:52 AM This report has been signed electronically.

## 2024-05-11 ENCOUNTER — Telehealth: Payer: Self-pay

## 2024-05-11 NOTE — Telephone Encounter (Signed)
  Follow up Call-     05/07/2024   10:23 AM  Call back number  Post procedure Call Back phone  # [phone]  Permission to leave phone message Yes     Patient questions:  Do you have a fever, pain , or abdominal swelling? No. Pain Score  0 *  Have you tolerated food without any problems? Yes.    Have you been able to return to your normal activities? Yes.    Do you have any questions about your discharge instructions: Diet   No. Medications  No. Follow up visit  No.  Do you have questions or concerns about your Care? No.  Actions: * If pain score is 4 or above: No action needed, pain <4.

## 2024-05-12 LAB — SURGICAL PATHOLOGY

## 2024-07-17 ENCOUNTER — Ambulatory Visit: Admitting: Family Medicine

## 2024-07-17 ENCOUNTER — Encounter: Payer: Self-pay | Admitting: Family Medicine

## 2024-07-17 VITALS — BP 114/55 | HR 73 | Temp 98.1°F | Ht 66.0 in | Wt 136.0 lb

## 2024-07-17 DIAGNOSIS — E039 Hypothyroidism, unspecified: Secondary | ICD-10-CM | POA: Diagnosis not present

## 2024-07-17 DIAGNOSIS — I480 Paroxysmal atrial fibrillation: Secondary | ICD-10-CM

## 2024-07-17 DIAGNOSIS — Z85850 Personal history of malignant neoplasm of thyroid: Secondary | ICD-10-CM

## 2024-07-17 LAB — LIPID PANEL

## 2024-07-17 MED ORDER — LEVOTHYROXINE SODIUM 137 MCG PO TABS: ORAL_TABLET | ORAL | 3 refills | Status: AC

## 2024-07-17 MED ORDER — FLECAINIDE ACETATE 50 MG PO TABS: 50.0000 mg | ORAL_TABLET | Freq: Two times a day (BID) | ORAL | 3 refills | Status: AC

## 2024-07-17 NOTE — Progress Notes (Signed)
 BP (!) 114/55   Pulse 73   Temp 98.1 F (36.7 C)   Ht 5' 6 (1.676 m)   Wt 136 lb (61.7 kg)   SpO2 96%   BMI 21.95 kg/m    Subjective:   Patient ID: [name], female    DOB: [birth year], 78 y.o.   MRN: [id number]  HPI: [name] is a 78 y.o. female presenting on 07/17/2024 for Medical Management of Chronic Issues and Hypothyroidism   Discussed the use of AI scribe software for clinical note transcription with the patient, who gave verbal consent to proceed.  History of Present Illness   [name] is a 78 year old female with atrial fibrillation and hypothyroidism who presents for a recheck.  She experiences episodes of increased heart rate, particularly when moving in and out of the house, which causes her to feel winded.  She continues to take flecainide  for atrial fibrillation and levothyroxine  137 mcg daily for hypothyroidism. She also takes multivitamins and aspirin regularly.  She had her thyroid  removed in the past and reports no issues with her current thyroid  medication regimen.          Relevant past medical, surgical, family and social history reviewed and updated as indicated. Interim medical history since our last visit reviewed. Allergies and medications reviewed and updated.  Review of Systems  Constitutional:  Negative for chills and fever.  Eyes:  Negative for visual disturbance.  Respiratory:  Negative for chest tightness and shortness of breath.   Cardiovascular:  Negative for chest pain and leg swelling.  Musculoskeletal:  Negative for back pain and gait problem.  Skin:  Negative for rash.  Neurological:  Negative for dizziness, light-headedness and headaches.  Psychiatric/Behavioral:  Negative for agitation and behavioral problems.   All other systems reviewed and are negative.   Per HPI unless specifically indicated above   Allergies as of 07/17/2024       Reactions   Penicillins Anaphylaxis   Actonel  [risedronate Sodium]    Nausea and worsened heart burn   Ceclor [cefaclor] Hives   Acetaminophen  Nausea And Vomiting, Other (See Comments)   Headache   Alendronate Sodium Nausea Only, Other (See Comments)   Heartburn, stomach upset   Celecoxib Other (See Comments)   Stomach upset, heart burn   Codeine Nausea And Vomiting, Other (See Comments)   Headache   Ventolin [kdc:albuterol] Palpitations   Rapid heart beat        Medication List        Accurate as of July 17, 2024 10:26 AM. If you have any questions, ask your nurse or doctor.          aspirin EC 325 MG tablet Take 325 mg by mouth daily.   carboxymethylcellul-glycerin 0.5-0.9 % ophthalmic solution Commonly known as: REFRESH OPTIVE Place 1 drop into both eyes in the morning and at bedtime.   Centrum Silver tablet Take 1 tablet by mouth every morning.   PRESERVISION/LUTEIN PO Take 1 capsule by mouth daily.   Fish Oil 1200 MG Caps Take 1 capsule by mouth daily.   flecainide  50 MG tablet Commonly known as: TAMBOCOR  Take 1 tablet (50 mg total) by mouth 2 (two) times daily.   levothyroxine  137 MCG tablet Commonly known as: Synthroid  TAKE 1 TABLET BY MOUTH DAILY BEFORE BREAKFAST.   loratadine 10 MG tablet Commonly known as: CLARITIN Take 10 mg by mouth daily as needed for allergies.   Refresh Lacri-Lube Oint Apply 1  inch to eye at bedtime. Apply to bottom lid   Vitamin D3 50 MCG (2000 UT) Tabs Take 1 capsule by mouth daily.         Objective:   BP (!) 114/55   Pulse 73   Temp 98.1 F (36.7 C)   Ht 5' 6 (1.676 m)   Wt 136 lb (61.7 kg)   SpO2 96%   BMI 21.95 kg/m   Wt Readings from Last 3 Encounters:  07/17/24 136 lb (61.7 kg)  05/07/24 134 lb (60.8 kg)  04/14/24 134 lb (60.8 kg)    Physical Exam Physical Exam   NECK: Thyroid  without nodules. CHEST: Lungs clear to auscultation bilaterally. CARDIOVASCULAR: Heart regular rate and rhythm. EXTREMITIES: Extremities without edema,  pulses intact.         Assessment & Plan:   Problem List Items Addressed This Visit       Cardiovascular and Mediastinum   Atrial fibrillation (HCC)   Relevant Medications   flecainide  (TAMBOCOR ) 50 MG tablet     Endocrine   Hypothyroidism - Primary   Relevant Medications   levothyroxine  (SYNTHROID ) 137 MCG tablet   Other Relevant Orders   CBC with Differential/Platelet   CMP14+EGFR   Lipid panel   TSH     Other   History of thyroid  cancer       Paroxysmal atrial fibrillation Episodes of increased heart rate and dyspnea on exertion, resolving with rest. No chest pain. - Continue flecainide  as prescribed. - Review echocardiogram results scheduled for November 3rd. - Coordinate with Dr. Athena office for potential earlier follow-up if needed.  Hypothyroidism following thyroidectomy Managed with levothyroxine  137 mcg daily. No symptoms of inadequate control. - Continue levothyroxine  137 mcg daily.          Follow up plan: Return in about 6 months (around 01/14/2025), or if symptoms worsen or fail to improve, for Thyroid  recheck.  Counseling provided for all of the vaccine components Orders Placed This Encounter  Procedures   CBC with Differential/Platelet   CMP14+EGFR   Lipid panel   TSH    Fonda Levins, MD [facility] 07/17/2024, 10:26 AM

## 2024-07-18 LAB — TSH: TSH: 0.011 u[IU]/mL — AB (ref 0.450–4.500)

## 2024-07-18 LAB — CMP14+EGFR
ALT: 15 IU/L (ref 0–32)
AST: 25 IU/L (ref 0–40)
Albumin: 4.2 g/dL (ref 3.8–4.8)
Alkaline Phosphatase: 85 IU/L (ref 44–121)
BUN/Creatinine Ratio: 25 (ref 12–28)
BUN: 15 mg/dL (ref 8–27)
Bilirubin Total: 0.8 mg/dL (ref 0.0–1.2)
CO2: 27 mmol/L (ref 20–29)
Calcium: 9.7 mg/dL (ref 8.7–10.3)
Chloride: 101 mmol/L (ref 96–106)
Creatinine, Ser: 0.59 mg/dL (ref 0.57–1.00)
Globulin, Total: 2.6 g/dL (ref 1.5–4.5)
Glucose: 80 mg/dL (ref 70–99)
Potassium: 4.9 mmol/L (ref 3.5–5.2)
Sodium: 138 mmol/L (ref 134–144)
Total Protein: 6.8 g/dL (ref 6.0–8.5)
eGFR: 92 mL/min/1.73 (ref >59)

## 2024-07-18 LAB — CBC WITH DIFFERENTIAL/PLATELET
Basophils Absolute: 0 x10E3/uL (ref 0.0–0.2)
Basos: 0 %
EOS (ABSOLUTE): 0.3 x10E3/uL (ref 0.0–0.4)
Eos: 6 %
Hematocrit: 40.4 % (ref 34.0–46.6)
Hemoglobin: 12.9 g/dL (ref 11.1–15.9)
Immature Grans (Abs): 0 x10E3/uL (ref 0.0–0.1)
Immature Granulocytes: 0 %
Lymphocytes Absolute: 1.7 x10E3/uL (ref 0.7–3.1)
Lymphs: 29 %
MCH: 29.9 pg (ref 26.6–33.0)
MCHC: 31.9 g/dL (ref 31.5–35.7)
MCV: 94 fL (ref 79–97)
Monocytes Absolute: 0.6 x10E3/uL (ref 0.1–0.9)
Monocytes: 9 %
Neutrophils Absolute: 3.3 x10E3/uL (ref 1.4–7.0)
Neutrophils: 56 %
Platelets: 162 x10E3/uL (ref 150–450)
RBC: 4.32 x10E6/uL (ref 3.77–5.28)
RDW: 12.7 % (ref 11.7–15.4)
WBC: 5.9 x10E3/uL (ref 3.4–10.8)

## 2024-07-18 LAB — LIPID PANEL
Cholesterol, Total: 163 mg/dL (ref 100–199)
HDL: 99 mg/dL (ref >39)
LDL CALC COMMENT:: 1.6 ratio (ref 0.0–4.4)
LDL Chol Calc (NIH): 56 mg/dL (ref 0–99)
Triglycerides: 32 mg/dL (ref 0–149)
VLDL Cholesterol Cal: 8 mg/dL (ref 5–40)

## 2024-07-23 ENCOUNTER — Ambulatory Visit: Payer: Self-pay | Admitting: Family Medicine

## 2024-08-06 ENCOUNTER — Encounter: Payer: Self-pay | Admitting: Cardiology

## 2024-08-12 ENCOUNTER — Ambulatory Visit (INDEPENDENT_AMBULATORY_CARE_PROVIDER_SITE_OTHER): Admitting: *Deleted

## 2024-08-12 DIAGNOSIS — Z23 Encounter for immunization: Secondary | ICD-10-CM

## 2024-09-07 ENCOUNTER — Other Ambulatory Visit: Payer: PPO

## 2024-09-08 ENCOUNTER — Ambulatory Visit: Attending: Cardiology

## 2024-09-08 DIAGNOSIS — I340 Nonrheumatic mitral (valve) insufficiency: Secondary | ICD-10-CM

## 2024-09-08 LAB — ECHOCARDIOGRAM COMPLETE
AR max vel: 2.75 cm2
AV Peak grad: 8.3 mmHg
AV Vena cont: 0.6 cm
Ao pk vel: 1.44 m/s
Area-P 1/2: 3.45 cm2
Calc EF: 60.3 %
MV M vel: 5.49 m/s
MV Peak grad: 120.6 mmHg
P 1/2 time: 476 ms
S' Lateral: 2.4 cm
Single Plane A2C EF: 65 %
Single Plane A4C EF: 54.7 %

## 2024-09-11 ENCOUNTER — Ambulatory Visit: Payer: Self-pay | Admitting: Cardiology

## 2024-09-17 ENCOUNTER — Ambulatory Visit: Payer: Self-pay | Admitting: Gastroenterology

## 2024-09-23 DIAGNOSIS — H353131 Nonexudative age-related macular degeneration, bilateral, early dry stage: Secondary | ICD-10-CM | POA: Diagnosis not present

## 2024-09-23 DIAGNOSIS — Z961 Presence of intraocular lens: Secondary | ICD-10-CM | POA: Diagnosis not present

## 2024-09-23 DIAGNOSIS — H43811 Vitreous degeneration, right eye: Secondary | ICD-10-CM | POA: Diagnosis not present

## 2024-09-23 DIAGNOSIS — H04123 Dry eye syndrome of bilateral lacrimal glands: Secondary | ICD-10-CM | POA: Diagnosis not present

## 2024-09-28 DIAGNOSIS — Z1231 Encounter for screening mammogram for malignant neoplasm of breast: Secondary | ICD-10-CM | POA: Diagnosis not present

## 2024-09-28 LAB — HM MAMMOGRAPHY

## 2024-11-16 NOTE — Progress Notes (Unsigned)
" °  Cardiology Office Note:   Date:  11/18/2024  ID:  [name], DOB 03-Dec-1945, MRN [id number] PCP: Dettinger, Fonda LABOR, MD  [facility] Providers Cardiologist:  Lynwood Schilling, MD {  History of Present Illness:   [name] is a 79 y.o. female  who presents for follow up of atrial fib.   She has had surgery for treatment of  pheochromocytoma.  She has moderate tricuspid regurgitation with no evidence of pulmonary hypertension.   The last echo was 2025.     Since she was last seen she has done OK.  The patient denies any new symptoms such as chest discomfort, neck or arm discomfort. There has been no new PND or orthopnea. There have been no reported palpitations, presyncope or syncope.    She does get mildly short of breath on occasion with activity such as turning quickly to drag her garbage cans.  She thinks this is only mildly slowly progressive.  She is not feeling atrial fibrillation.  She is not having other cardiovascular symptoms.  Of note she is expecting her first great grand child in July.  ROS: As stated in the HPI and negative for all other systems.  Studies Reviewed:    EKG:   EKG Interpretation Date/Time:  Wednesday November 18 2024 13:44:04 EST Ventricular Rate:  91 PR Interval:  190 QRS Duration:  146 QT Interval:  394 QTC Calculation: 484 R Axis:   62  Text Interpretation: Sinus rhythm with Premature supraventricular complexes Left bundle branch block When compared with ECG of 12-Nov-2023 11:48, Premature supraventricular complexes are now Present NSVT Confirmed by Schilling Rattan ([id number]) on 11/18/2024 1:57:16 PM NA  Risk Assessment/Calculations:              Physical Exam:   VS:  BP (!) 108/55 (BP Location: Left Arm, Patient Position: Sitting, Cuff Size: Normal)   Pulse 96   Resp 16   Ht 5' 6 (1.676 m)   Wt 139 lb 9.6 oz (63.3 kg)   SpO2 94%   BMI 22.53 kg/m    Wt Readings from Last 3 Encounters:  11/18/24 139 lb 9.6 oz (63.3 kg)   07/17/24 136 lb (61.7 kg)  05/07/24 134 lb (60.8 kg)     GEN: Well nourished, well developed in no acute distress NECK: No JVD; No carotid bruits CARDIAC: RRR, no murmurs, rubs, gallops RESPIRATORY:  Clear to auscultation without rales, wheezing or rhonchi  ABDOMEN: Soft, non-tender, non-distended EXTREMITIES:  No edema; No deformity   ASSESSMENT AND PLAN:   HYPERTENSION -  The blood pressure is at target. No change in medications is indicated. We will continue with therapeutic lifestyle changes (TLC).  ATRIAL FIBRILLATION -  She has had no symptomatic paroxysms.  No change in therapy.  She remains on flecainide    TRICUSPID REGURGITATION - This was moderate in 2025.  She wants to continue to manage this conservatively and she has no symptoms that would prompt further management or intervention.  MITRAL REGURGITATION -  This was mild to moderate on echo in Nov 2025.  I will continue to follow this clinically.  LBBB - This has been chronic and she has had no symptoms related to this.      Follow up with me in 1 year  Signed, Lynwood Schilling, MD   "

## 2024-11-18 ENCOUNTER — Ambulatory Visit: Admitting: Cardiology

## 2024-11-18 ENCOUNTER — Encounter: Payer: Self-pay | Admitting: Cardiology

## 2024-11-18 VITALS — BP 108/55 | HR 96 | Resp 16 | Ht 66.0 in | Wt 139.6 lb

## 2024-11-18 DIAGNOSIS — I361 Nonrheumatic tricuspid (valve) insufficiency: Secondary | ICD-10-CM | POA: Diagnosis not present

## 2024-11-18 DIAGNOSIS — I10 Essential (primary) hypertension: Secondary | ICD-10-CM

## 2024-11-18 DIAGNOSIS — I447 Left bundle-branch block, unspecified: Secondary | ICD-10-CM

## 2024-11-18 DIAGNOSIS — I349 Nonrheumatic mitral valve disorder, unspecified: Secondary | ICD-10-CM

## 2024-11-18 DIAGNOSIS — I059 Rheumatic mitral valve disease, unspecified: Secondary | ICD-10-CM

## 2024-11-18 DIAGNOSIS — I480 Paroxysmal atrial fibrillation: Secondary | ICD-10-CM | POA: Diagnosis not present

## 2024-11-18 NOTE — Patient Instructions (Addendum)

## 2025-01-14 ENCOUNTER — Ambulatory Visit: Payer: Self-pay | Admitting: Family Medicine

## 2025-01-14 ENCOUNTER — Other Ambulatory Visit: Payer: Self-pay
